# Patient Record
Sex: Male | Born: 1945 | Race: White | Hispanic: No | Marital: Married | Smoking: Former smoker
Health system: Southern US, Community
[De-identification: ages and names within clinical notes are randomized; demographics above are authoritative.]

## PROBLEM LIST (undated history)

## (undated) DIAGNOSIS — Z87442 Personal history of urinary calculi: Secondary | ICD-10-CM

## (undated) DIAGNOSIS — M1990 Unspecified osteoarthritis, unspecified site: Secondary | ICD-10-CM

## (undated) DIAGNOSIS — K760 Fatty (change of) liver, not elsewhere classified: Secondary | ICD-10-CM

## (undated) DIAGNOSIS — J189 Pneumonia, unspecified organism: Secondary | ICD-10-CM

## (undated) DIAGNOSIS — R51 Headache: Secondary | ICD-10-CM

## (undated) DIAGNOSIS — I499 Cardiac arrhythmia, unspecified: Secondary | ICD-10-CM

## (undated) DIAGNOSIS — I2510 Atherosclerotic heart disease of native coronary artery without angina pectoris: Secondary | ICD-10-CM

## (undated) DIAGNOSIS — I428 Other cardiomyopathies: Secondary | ICD-10-CM

## (undated) DIAGNOSIS — Z86010 Personal history of colonic polyps: Secondary | ICD-10-CM

## (undated) DIAGNOSIS — F329 Major depressive disorder, single episode, unspecified: Secondary | ICD-10-CM

## (undated) DIAGNOSIS — G4730 Sleep apnea, unspecified: Secondary | ICD-10-CM

## (undated) DIAGNOSIS — I4891 Unspecified atrial fibrillation: Secondary | ICD-10-CM

## (undated) DIAGNOSIS — I498 Other specified cardiac arrhythmias: Secondary | ICD-10-CM

## (undated) DIAGNOSIS — Z9289 Personal history of other medical treatment: Secondary | ICD-10-CM

## (undated) DIAGNOSIS — F5104 Psychophysiologic insomnia: Secondary | ICD-10-CM

## (undated) DIAGNOSIS — N400 Enlarged prostate without lower urinary tract symptoms: Secondary | ICD-10-CM

## (undated) DIAGNOSIS — K219 Gastro-esophageal reflux disease without esophagitis: Secondary | ICD-10-CM

## (undated) DIAGNOSIS — N529 Male erectile dysfunction, unspecified: Secondary | ICD-10-CM

## (undated) DIAGNOSIS — F419 Anxiety disorder, unspecified: Secondary | ICD-10-CM

## (undated) DIAGNOSIS — K5730 Diverticulosis of large intestine without perforation or abscess without bleeding: Secondary | ICD-10-CM

## (undated) DIAGNOSIS — E785 Hyperlipidemia, unspecified: Secondary | ICD-10-CM

## (undated) HISTORY — DX: Diverticulosis of large intestine without perforation or abscess without bleeding: K57.30

## (undated) HISTORY — PX: TONSILLECTOMY AND ADENOIDECTOMY: SUR1326

## (undated) HISTORY — DX: Unspecified osteoarthritis, unspecified site: M19.90

## (undated) HISTORY — DX: Anxiety disorder, unspecified: F41.9

## (undated) HISTORY — DX: Other cardiomyopathies: I42.8

## (undated) HISTORY — DX: Atherosclerotic heart disease of native coronary artery without angina pectoris: I25.10

## (undated) HISTORY — DX: Personal history of colonic polyps: Z86.010

## (undated) HISTORY — DX: Hyperlipidemia, unspecified: E78.5

## (undated) HISTORY — DX: Personal history of other medical treatment: Z92.89

## (undated) HISTORY — PX: APPENDECTOMY: SHX54

## (undated) HISTORY — DX: Gastro-esophageal reflux disease without esophagitis: K21.9

## (undated) HISTORY — DX: Major depressive disorder, single episode, unspecified: F32.9

## (undated) HISTORY — DX: Personal history of urinary calculi: Z87.442

## (undated) HISTORY — DX: Benign prostatic hyperplasia without lower urinary tract symptoms: N40.0

## (undated) HISTORY — DX: Fatty (change of) liver, not elsewhere classified: K76.0

## (undated) HISTORY — DX: Other specified cardiac arrhythmias: I49.8

## (undated) HISTORY — DX: Male erectile dysfunction, unspecified: N52.9

## (undated) HISTORY — DX: Psychophysiologic insomnia: F51.04

---

## 1984-09-18 HISTORY — PX: INGUINAL HERNIA REPAIR: SUR1180

## 2003-02-10 ENCOUNTER — Encounter: Payer: Self-pay | Admitting: Family Medicine

## 2003-02-10 ENCOUNTER — Encounter: Admission: RE | Admit: 2003-02-10 | Discharge: 2003-02-10 | Payer: Self-pay | Admitting: Family Medicine

## 2003-02-12 ENCOUNTER — Encounter: Payer: Self-pay | Admitting: General Surgery

## 2003-02-12 ENCOUNTER — Encounter (INDEPENDENT_AMBULATORY_CARE_PROVIDER_SITE_OTHER): Payer: Self-pay | Admitting: Specialist

## 2003-02-12 ENCOUNTER — Observation Stay (HOSPITAL_COMMUNITY): Admission: RE | Admit: 2003-02-12 | Discharge: 2003-02-12 | Payer: Self-pay | Admitting: General Surgery

## 2003-12-23 ENCOUNTER — Emergency Department (HOSPITAL_COMMUNITY): Admission: EM | Admit: 2003-12-23 | Discharge: 2003-12-23 | Payer: Self-pay | Admitting: Emergency Medicine

## 2004-09-18 HISTORY — PX: CHOLECYSTECTOMY: SHX55

## 2005-09-18 LAB — CONVERTED CEMR LAB: PSA: NORMAL ng/mL

## 2005-11-22 ENCOUNTER — Ambulatory Visit: Payer: Self-pay | Admitting: Internal Medicine

## 2005-12-01 ENCOUNTER — Ambulatory Visit: Payer: Self-pay | Admitting: Internal Medicine

## 2005-12-01 ENCOUNTER — Encounter (INDEPENDENT_AMBULATORY_CARE_PROVIDER_SITE_OTHER): Payer: Self-pay | Admitting: Specialist

## 2006-09-18 DIAGNOSIS — I498 Other specified cardiac arrhythmias: Secondary | ICD-10-CM

## 2006-09-18 HISTORY — DX: Other specified cardiac arrhythmias: I49.8

## 2007-02-20 ENCOUNTER — Ambulatory Visit: Payer: Self-pay | Admitting: Internal Medicine

## 2009-03-15 ENCOUNTER — Ambulatory Visit: Payer: Self-pay | Admitting: Internal Medicine

## 2009-03-15 DIAGNOSIS — Z860100 Personal history of colon polyps, unspecified: Secondary | ICD-10-CM | POA: Insufficient documentation

## 2009-03-15 DIAGNOSIS — F329 Major depressive disorder, single episode, unspecified: Secondary | ICD-10-CM

## 2009-03-15 DIAGNOSIS — K5730 Diverticulosis of large intestine without perforation or abscess without bleeding: Secondary | ICD-10-CM

## 2009-03-15 DIAGNOSIS — Z860101 Personal history of adenomatous and serrated colon polyps: Secondary | ICD-10-CM

## 2009-03-15 DIAGNOSIS — R109 Unspecified abdominal pain: Secondary | ICD-10-CM | POA: Insufficient documentation

## 2009-03-15 DIAGNOSIS — K219 Gastro-esophageal reflux disease without esophagitis: Secondary | ICD-10-CM

## 2009-03-15 DIAGNOSIS — F3289 Other specified depressive episodes: Secondary | ICD-10-CM

## 2009-03-15 HISTORY — DX: Diverticulosis of large intestine without perforation or abscess without bleeding: K57.30

## 2009-03-15 HISTORY — DX: Other specified depressive episodes: F32.89

## 2009-03-15 HISTORY — DX: Gastro-esophageal reflux disease without esophagitis: K21.9

## 2009-03-15 HISTORY — DX: Personal history of adenomatous and serrated colon polyps: Z86.0101

## 2009-03-15 HISTORY — DX: Personal history of colonic polyps: Z86.010

## 2009-03-15 HISTORY — DX: Major depressive disorder, single episode, unspecified: F32.9

## 2009-03-15 LAB — CONVERTED CEMR LAB
ALT: 23 units/L (ref 0–53)
Alkaline Phosphatase: 75 units/L (ref 39–117)
BUN: 16 mg/dL (ref 6–23)
Bilirubin Urine: NEGATIVE
Bilirubin, Direct: 0.2 mg/dL (ref 0.0–0.3)
Calcium: 9.8 mg/dL (ref 8.4–10.5)
Creatinine, Ser: 1.2 mg/dL (ref 0.4–1.5)
Eosinophils Relative: 2.3 % (ref 0.0–5.0)
GFR calc non Af Amer: 64.57 mL/min (ref >60)
Hemoglobin, Urine: NEGATIVE
LDL Cholesterol: 109 mg/dL — ABNORMAL HIGH (ref 0–99)
Lymphocytes Relative: 30.4 % (ref 12.0–46.0)
MCV: 88 fL (ref 78.0–100.0)
Monocytes Absolute: 0.7 10*3/uL (ref 0.1–1.0)
Monocytes Relative: 11 % (ref 3.0–12.0)
Neutrophils Relative %: 55.3 % (ref 43.0–77.0)
Platelets: 204 10*3/uL (ref 150.0–400.0)
RBC: 4.97 M/uL (ref 4.22–5.81)
TSH: 1.78 microintl units/mL (ref 0.35–5.50)
Total Bilirubin: 1.3 mg/dL — ABNORMAL HIGH (ref 0.3–1.2)
Total CHOL/HDL Ratio: 4
Total Protein, Urine: NEGATIVE mg/dL
Triglycerides: 92 mg/dL (ref 0.0–149.0)
Urine Glucose: NEGATIVE mg/dL
WBC: 6.6 10*3/uL (ref 4.5–10.5)

## 2009-03-19 ENCOUNTER — Encounter: Payer: Self-pay | Admitting: Internal Medicine

## 2009-03-19 ENCOUNTER — Ambulatory Visit: Payer: Self-pay | Admitting: Cardiovascular Disease

## 2009-03-19 DIAGNOSIS — N200 Calculus of kidney: Secondary | ICD-10-CM | POA: Insufficient documentation

## 2009-04-01 ENCOUNTER — Telehealth: Payer: Self-pay | Admitting: Internal Medicine

## 2010-01-28 ENCOUNTER — Ambulatory Visit: Payer: Self-pay | Admitting: Internal Medicine

## 2010-01-28 DIAGNOSIS — N529 Male erectile dysfunction, unspecified: Secondary | ICD-10-CM

## 2010-01-28 DIAGNOSIS — R1011 Right upper quadrant pain: Secondary | ICD-10-CM

## 2010-01-28 DIAGNOSIS — R739 Hyperglycemia, unspecified: Secondary | ICD-10-CM

## 2010-01-28 DIAGNOSIS — Z87442 Personal history of urinary calculi: Secondary | ICD-10-CM

## 2010-01-28 HISTORY — DX: Personal history of urinary calculi: Z87.442

## 2010-01-28 HISTORY — DX: Male erectile dysfunction, unspecified: N52.9

## 2010-01-28 LAB — CONVERTED CEMR LAB
AST: 22 units/L (ref 0–37)
Alkaline Phosphatase: 81 units/L (ref 39–117)
BUN: 17 mg/dL (ref 6–23)
Basophils Absolute: 0.1 10*3/uL (ref 0.0–0.1)
Bilirubin Urine: NEGATIVE
Calcium: 9.7 mg/dL (ref 8.4–10.5)
Cholesterol: 157 mg/dL (ref 0–200)
Creatinine, Ser: 1 mg/dL (ref 0.4–1.5)
Creatinine,U: 178 mg/dL
GFR calc non Af Amer: 75.96 mL/min (ref >60)
Glucose, Bld: 102 mg/dL — ABNORMAL HIGH (ref 70–99)
Ketones, ur: NEGATIVE mg/dL
LDL Cholesterol: 106 mg/dL — ABNORMAL HIGH (ref 0–99)
Leukocytes, UA: NEGATIVE
Lymphocytes Relative: 32.8 % (ref 12.0–46.0)
Lymphs Abs: 2.3 10*3/uL (ref 0.7–4.0)
Microalb, Ur: 0.5 mg/dL (ref 0.0–1.9)
Monocytes Relative: 9.7 % (ref 3.0–12.0)
Neutrophils Relative %: 53.9 % (ref 43.0–77.0)
PSA: 2.76 ng/mL (ref 0.10–4.00)
Platelets: 199 10*3/uL (ref 150.0–400.0)
RDW: 13.8 % (ref 11.5–14.6)
Total Bilirubin: 0.9 mg/dL (ref 0.3–1.2)
Total Protein, Urine: NEGATIVE mg/dL
VLDL: 16 mg/dL (ref 0.0–40.0)
pH: 5.5 (ref 5.0–8.0)

## 2010-01-31 LAB — CONVERTED CEMR LAB
Sex Hormone Binding: 19 nmol/L (ref 13–71)
Testosterone: 204.26 ng/dL — ABNORMAL LOW (ref 350–890)

## 2010-02-07 ENCOUNTER — Encounter: Admission: RE | Admit: 2010-02-07 | Discharge: 2010-02-07 | Payer: Self-pay | Admitting: Internal Medicine

## 2010-04-05 ENCOUNTER — Ambulatory Visit: Payer: Self-pay | Admitting: Internal Medicine

## 2010-04-05 DIAGNOSIS — M79609 Pain in unspecified limb: Secondary | ICD-10-CM | POA: Insufficient documentation

## 2010-10-18 NOTE — Assessment & Plan Note (Signed)
Summary: STOMACH PROBLEMS/NWS   Vital Signs:  Patient profile:   65 year old male Weight:      288 pounds BMI:     34.28 Temp:     98.0 degrees F oral BP sitting:   122 / 78  (left arm) Cuff size:   large  Vitals Entered By: Duard Brady LPN (Jan 28, 2010 9:16 AM)  CC: c/o RUQ pain - constent , using Prilosec otc Is Patient Diabetic? No   CC:  c/o RUQ pain - constent  and using Prilosec otc.  History of Present Illness: here for wellness  also s/p ccx;  c/o pain to the RUQ for approx 4 wks, dull , mild to mod; no radiation, no worse with food, and no n/v, no bowel change, no blood or radiation; has had mod to severe reflux symtpoms - worse with chinese food recently - better with water and OTC antacid;  no wt loss, night sweats, fever.   Has had more stress recently;  more travel recently.  Pt denies other CP, sob, doe, wheezing, orthopnea, pnd, worsening LE edema, palps, dizziness or syncope . last episode in teens  - resolved in mid20's.  No prior egd's.  No recent wt loss, fever, night sweats or other constitutional symtpoms.  Pt denies polydipsia, polyuria, or low sugar symptoms such as shakiness improved with eating.  Overall good compliance with meds, trying to follow low chol, DM diet, wt stable, little excercise however   Preventive Screening-Counseling & Management  Alcohol-Tobacco     Smoking Status: never      Drug Use:  no.    Problems Prior to Update: 1)  Diabetes Mellitus, Type II  (ICD-250.00) 2)  Gerd  (ICD-530.81) 3)  Ruq Pain  (ICD-789.01) 4)  Erectile Dysfunction, Organic  (ICD-607.84) 5)  Nephrolithiasis, Hx of  (ICD-V13.01) 6)  Preventive Health Care  (ICD-V70.0) 7)  Nephrolithiasis  (ICD-592.0) 8)  Abdominal Pain Other Specified Site  (ICD-789.09) 9)  Preventive Health Care  (ICD-V70.0) 10)  Colonic Polyps, Hx of  (ICD-V12.72) 11)  Diverticulosis, Colon  (ICD-562.10) 12)  Depression  (ICD-311) 13)  Gerd  (ICD-530.81)  Medications Prior to  Update: 1)  Aspir-Low 81 Mg Tbec (Aspirin) .Marland Kitchen.. 1po Once Daily  Current Medications (verified): 1)  Aspir-Low 81 Mg Tbec (Aspirin) .Marland Kitchen.. 1po Once Daily 2)  Prilosec Otc 20 Mg Tbec (Omeprazole Magnesium) .Marland Kitchen.. 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         all otherwise negative per pt -  except for mild worsening ED symtpoms  recent  Physical Exam  General:  alert and overweight-appearing.   Head:  normocephalic and atraumatic.   Eyes:  vision grossly intact, pupils equal, and pupils round.   Ears:  R ear normal and L ear normal.   Nose:  no external deformity and no nasal discharge.   Mouth:  no gingival abnormalities and pharynx pink and moist.   Neck:  supple and no masses.   Lungs:  normal respiratory effort and normal breath sounds.   Heart:  normal rate and regular rhythm.   Abdomen:  soft, normal bowel sounds, no distention, no masses, and no guarding.  with mild epigastric tender Msk:  no joint tenderness and no joint swelling.   Extremities:  no edema, no erythema  Neurologic:  cranial nerves II-XII intact and strength normal in all extremities.     Impression & Recommendations:  Problem # 1:  Preventive Health Care (ICD-V70.0) Overall doing well, age appropriate education and counseling updated and referral for appropriate preventive services done unless declined, immunizations up to date or declined, diet counseling done if overweight, urged to quit smoking if smokes , most recent labs reviewed and current ordered if appropriate, ecg reviewed or declined (interpretation per ECG scanned in the EMR if done); information regarding Medicare Prevention requirements given if appropriate; speciality referrals updated as appropriate  Orders: TLB-BMP (Basic Metabolic Panel-BMET) (80048-METABOL) TLB-CBC Platelet - w/Differential (85025-CBCD) TLB-Hepatic/Liver Function Pnl (80076-HEPATIC) TLB-Lipid Panel (80061-LIPID) TLB-TSH (Thyroid Stimulating Hormone) (84443-TSH) TLB-PSA (Prostate Specific Antigen) (84153-PSA) TLB-Udip ONLY (81003-UDIP)  Problem # 2:  ERECTILE DYSFUNCTION, ORGANIC (ICD-607.84)  mild, requests testosterone check only  Orders: T-Testosterone, Free and Total [phone])  Problem # 3:  RUQ PAIN (ICD-789.01)  for f/u  with tests above, cant r/o pud;  for tx as below, also  check h pylori, consider GI eval  Orders: Radiology Referral (Radiology) TLB-H. Pylori Abs(Helicobacter Pylori) (86677-HELICO)  Problem # 4:  GERD (ICD-530.81)  His updated medication list for this problem includes:    Prilosec Otc 20 Mg Tbec (Omeprazole magnesium) .Marland Kitchen... 1 by mouth two times a day to increased to two times a day for 4 wks;  to GI if not well controlled  Problem # 5:  DIABETES MELLITUS, TYPE II (ICD-250.00)  His updated medication list for this problem includes:    Aspir-low 81 Mg Tbec (Aspirin) .Marland Kitchen... 1po once daily  Orders: TLB-A1C / Hgb A1C (Glycohemoglobin) (83036-A1C) TLB-Microalbumin/Creat Ratio, Urine (82043-MALB)  Labs Reviewed: Creat: 1.2 (03/15/2009)    Reviewed HgBA1c results: 6.6 (03/15/2009) stable overall by hx and exam, ok to continue meds/tx as is   Complete Medication List: 1)  Aspir-low 81 Mg Tbec (Aspirin) .Marland Kitchen.. 1po once daily 2)  Prilosec Otc 20 Mg Tbec (Omeprazole magnesium) .Marland Kitchen.. 1 by mouth two times a day  Patient Instructions: 1)  Please go to the Lab in the basement for your blood and/or urine tests today 2)  increase the omeprazole 20 mg to twice per day 3)  You will be contacted about the referral(s) to: ultrasound 4)  please call if the medication does not seem to work  in a week, for GI referral, or if symptoms recur after stopping the omeprazole after 4 wks 5)  Please schedule a follow-up appointment in 1 year or sooner if needed Prescriptions: PRILOSEC OTC 20 MG TBEC (OMEPRAZOLE MAGNESIUM) 1 by mouth two times a day  #60 x 11   Entered and Authorized by:   Corwin Levins MD   Signed by:   Corwin Levins MD on 01/28/2010   Method used:   Print then Give to Patient   RxID:   [id number]

## 2010-10-18 NOTE — Assessment & Plan Note (Signed)
Summary: L PAINFUL SWOLLEN FOOT--STC   Vital Signs:  Patient profile:   65 year old male Height:      75 inches Weight:      295.50 pounds BMI:     37.07 O2 Sat:      96 % on Room air Temp:     98.7 degrees F oral Pulse rate:   42 / minute BP sitting:   120 / 72  (left arm) Cuff size:   large  Vitals Entered By: Zella Ball Ewing CMA Duncan Dull) (April 05, 2010 11:43 AM)  O2 Flow:  Room air  CC: Left  foot painful and swollen/RE   CC:  Left  foot painful and swollen/RE.  History of Present Illness: here with 3 days onset left lateral mid foot area mod to severe pain, redness and swelling , without fever, chills, red streaks, or obvious trauma or foot injury;  seemed to start 3 days ago after cutting the grass with a push mower ; toes have little pain though and flexion/extnesion does not seem to exacerbate;  walking makes worse and foot otherwise without blueness , other pain, numb, weakness.  Pt denies CP, sob, doe, wheezing, orthopnea, pnd, worsening LE edema, palps, dizziness or syncope  Pt denies new neuro symptoms such as headache, facial or extremity weakness   Has hx of gout to right first MTP.  Pt denies polydipsia, polyuria, or low sugar symptoms such as shakiness improved with eating.  Overall good compliance with meds, trying to follow low chol, DM diet, wt stable, little excercise however   Problems Prior to Update: 1)  Foot Pain, Left  (ICD-729.5) 2)  Diabetes Mellitus, Type II  (ICD-250.00) 3)  Gerd  (ICD-530.81) 4)  Ruq Pain  (ICD-789.01) 5)  Erectile Dysfunction, Organic  (ICD-607.84) 6)  Nephrolithiasis, Hx of  (ICD-V13.01) 7)  Preventive Health Care  (ICD-V70.0) 8)  Nephrolithiasis  (ICD-592.0) 9)  Abdominal Pain Other Specified Site  (ICD-789.09) 10)  Preventive Health Care  (ICD-V70.0) 11)  Colonic Polyps, Hx of  (ICD-V12.72) 12)  Diverticulosis, Colon  (ICD-562.10) 13)  Depression  (ICD-311) 14)  Gerd  (ICD-530.81)  Medications Prior to Update: 1)  Aspir-Low 81 Mg  Tbec (Aspirin) .Marland Kitchen.. 1po Once Daily 2)  Prilosec Otc 20 Mg Tbec (Omeprazole Magnesium) .Marland Kitchen.. 1 By Mouth Two Times A Day  Current Medications (verified): 1)  Aspir-Low 81 Mg Tbec (Aspirin) .Marland Kitchen.. 1po Once Daily 2)  Prilosec Otc 20 Mg Tbec (Omeprazole Magnesium) .Marland Kitchen.. 1 By Mouth Two Times A Day 3)  Doxycycline Hyclate 100 Mg Caps (Doxycycline Hyclate) .Marland Kitchen.. 1 By Mouth Two Times A Day 4)  Prednisone 10 Mg Tabs (Prednisone) .... 4po Qd For 3days, Then 3po Qd For 3days, Then 2po Qd For 3days, Then 1po Qd For 3 Days, Then Stop 5)  Oxycodone Hcl 5 Mg Tabs (Oxycodone Hcl) .Marland Kitchen.. 1po Q 6 Hrs As Needed Pain  Allergies (verified): No Known Drug Allergies  Past History:  Past Medical History: Last updated: 01/28/2010 GERD Depression/anxiety - situaional chronic insomnia Erectile dysfunction atrial bigeminy - dr Graciela Husbands - 2008 Diverticulosis, colon Colonic polyps, hx of - adenomatous 3/07 fatty liver Nephrolithiasis, hx of - left renal stone Diabetes mellitus, type II - diet  Past Surgical History: Last updated: 01/28/2010 Inguinal herniorrhaphy Appendectomy Cholecystectomy - 2006 Tonsillectomy  Social History: Last updated: 01/28/2010 Married step daughter and 2 8yo twins live with 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   Head:  normocephalic and atraumatic.   Eyes:  vision grossly intact, pupils equal, and pupils round.   Ears:  R ear normal and L ear normal.   Nose:  no external deformity and no nasal discharge.   Mouth:  no gingival abnormalities and pharynx pink and moist.   Neck:  supple and no masses.   Lungs:  normal respiratory effort and normal breath sounds.   Heart:  normal rate and regular rhythm.   Msk:  left lateral mid foot with  nondiscrete but rather large approx 4 cm area marked red, swelling, tender without fluctuance or abscess;  mid dorsal and medial foot non tender, toes nontender and non swollen, but sweling from the midfoot does extend to above the left ankle;  ankle essentially Nontender with FROM;  left foot o/w neurovasc intact  Extremities:  no edema, no erythema except for above    Impression & Recommendations:  Problem # 1:  FOOT PAIN, LEFT (ICD-729.5) diff includes gout, vs trauma, stress fx or cellultiits -  more likely cellulitis vs gout - ok for depo IM today, pt declines films, and tx with doxy course and prednisone for home;  consider ortho for any worsening symptoms Orders: Depo- Medrol 80mg  (J1040) Depo- Medrol 40mg  (J1030) Admin of Therapeutic Inj  intramuscular or subcutaneous (16109)  Problem # 2:  DIABETES MELLITUS, TYPE II (ICD-250.00)  His updated medication list for this problem includes:    Aspir-low 81 Mg Tbec (Aspirin) .Marland Kitchen... 1po once daily  Labs Reviewed: Creat: 1.0 (01/28/2010)    Reviewed HgBA1c results: 6.4 (01/28/2010)  6.6 (03/15/2009) d/w pt - currently asympt and controlled - to call for onset polydipsia , polyuria or increased cbg > 200  Complete Medication List: 1)  Aspir-low 81 Mg Tbec (Aspirin) .Marland Kitchen.. 1po once daily 2)  Prilosec Otc 20 Mg Tbec (Omeprazole magnesium) .Marland Kitchen.. 1 by mouth two times a day 3)  Doxycycline Hyclate 100 Mg Caps (Doxycycline hyclate) .Marland Kitchen.. 1 by mouth two times a day 4)  Prednisone 10 Mg Tabs (Prednisone) .... 4po qd for 3days, then 3po qd for 3days, then 2po qd for 3days, then 1po qd for 3 days, then stop 5)  Oxycodone Hcl 5 Mg Tabs (Oxycodone hcl) .Marland Kitchen.. 1po q 6 hrs as needed pain  Patient Instructions: 1)  you had the steroid shot today 2)  Please take all new medications as prescribed 3)  Continue all previous medications as before this visit  4)  Please schedule a follow-up appointment in May 2012 with CPX labs Prescriptions: OXYCODONE HCL 5  MG TABS (OXYCODONE HCL) 1po q 6 hrs as needed pain  #60 x 0   Entered and Authorized by:   Corwin Levins MD   Signed by:   Corwin Levins MD on 04/05/2010   Method used:   Print then Give to Patient   RxID:   [id number] PREDNISONE 10 MG TABS (PREDNISONE) 4po qd for 3days, then 3po qd for 3days, then 2po qd for 3days, then 1po qd for 3 days, then stop  #30 x 0   Entered and Authorized by:   Corwin Levins MD   Signed by:   Corwin Levins MD on 04/05/2010   Method used:   Print then Give to Patient   RxID:  [id number] DOXYCYCLINE HYCLATE 100 MG CAPS (DOXYCYCLINE HYCLATE) 1 by mouth two times a day  #20 x 0   Entered and Authorized by:   Corwin Levins MD   Signed by:   Corwin Levins MD on 04/05/2010   Method used:   Print then Give to Patient   RxID:   [id number]    Medication Administration  Injection # 1:    Medication: Depo- Medrol 80mg     Diagnosis: FOOT PAIN, LEFT (ICD-729.5)    Route: IM    Site: R deltoid    Exp Date: 12/2012    Lot #: [id number]    Mfr: [employer]    Comments: 120mg     Patient tolerated injection without complications    Given by: Lamar Sprinkles, CMA (April 05, 2010 12:43 PM)  Injection # 2:    Medication: Depo- Medrol 40mg     Diagnosis: FOOT PAIN, LEFT (ICD-729.5)    Comments: Same as above    Patient tolerated injection without complications    Given by: Lamar Sprinkles, CMA (April 05, 2010 12:44 PM)  Orders Added: 1)  Depo- Medrol 80mg  [J1040] 2)  Depo- Medrol 40mg  [J1030] 3)  Admin of Therapeutic Inj  intramuscular or subcutaneous [96372] 4)  Est. Patient Level IV [32440]

## 2010-12-07 ENCOUNTER — Encounter: Payer: Self-pay | Admitting: Internal Medicine

## 2010-12-13 ENCOUNTER — Other Ambulatory Visit (INDEPENDENT_AMBULATORY_CARE_PROVIDER_SITE_OTHER): Payer: Medicare Other

## 2010-12-13 ENCOUNTER — Encounter: Payer: Self-pay | Admitting: Internal Medicine

## 2010-12-13 ENCOUNTER — Ambulatory Visit (INDEPENDENT_AMBULATORY_CARE_PROVIDER_SITE_OTHER): Payer: Medicare Other | Admitting: Internal Medicine

## 2010-12-13 VITALS — BP 132/70 | HR 48 | Temp 98.0°F | Ht 77.0 in | Wt 289.2 lb

## 2010-12-13 DIAGNOSIS — R109 Unspecified abdominal pain: Secondary | ICD-10-CM

## 2010-12-13 DIAGNOSIS — Z0000 Encounter for general adult medical examination without abnormal findings: Secondary | ICD-10-CM

## 2010-12-13 DIAGNOSIS — R1011 Right upper quadrant pain: Secondary | ICD-10-CM

## 2010-12-13 DIAGNOSIS — Z125 Encounter for screening for malignant neoplasm of prostate: Secondary | ICD-10-CM

## 2010-12-13 DIAGNOSIS — R1310 Dysphagia, unspecified: Secondary | ICD-10-CM

## 2010-12-13 DIAGNOSIS — G4710 Hypersomnia, unspecified: Secondary | ICD-10-CM

## 2010-12-13 DIAGNOSIS — R10A1 Flank pain, right side: Secondary | ICD-10-CM

## 2010-12-13 DIAGNOSIS — E119 Type 2 diabetes mellitus without complications: Secondary | ICD-10-CM

## 2010-12-13 LAB — LIPID PANEL
Cholesterol: 181 mg/dL (ref 0–200)
HDL: 37 mg/dL — ABNORMAL LOW (ref >39.00)
Triglycerides: 111 mg/dL (ref 0.0–149.0)
VLDL: 22.2 mg/dL (ref 0.0–40.0)

## 2010-12-13 LAB — CBC WITH DIFFERENTIAL/PLATELET
Eosinophils Relative: 2.1 % (ref 0.0–5.0)
HCT: 46.6 % (ref 39.0–52.0)
Hemoglobin: 15.8 g/dL (ref 13.0–17.0)
Lymphs Abs: 2.6 10*3/uL (ref 0.7–4.0)
Monocytes Relative: 9.3 % (ref 3.0–12.0)
Neutro Abs: 4.3 10*3/uL (ref 1.4–7.7)
Platelets: 211 10*3/uL (ref 150.0–400.0)
WBC: 7.9 10*3/uL (ref 4.5–10.5)

## 2010-12-13 LAB — URINALYSIS, ROUTINE W REFLEX MICROSCOPIC
Bilirubin Urine: NEGATIVE
Hgb urine dipstick: NEGATIVE
Ketones, ur: NEGATIVE
Total Protein, Urine: NEGATIVE
Urine Glucose: NEGATIVE
pH: 5 (ref 5.0–8.0)

## 2010-12-13 LAB — PSA: PSA: 2.8 ng/mL (ref 0.10–4.00)

## 2010-12-13 LAB — HEPATIC FUNCTION PANEL
ALT: 24 U/L (ref 0–53)
AST: 21 U/L (ref 0–37)
Total Bilirubin: 1 mg/dL (ref 0.3–1.2)

## 2010-12-13 LAB — BASIC METABOLIC PANEL
CO2: 31 mEq/L (ref 19–32)
Chloride: 107 mEq/L (ref 96–112)
Creatinine, Ser: 1.1 mg/dL (ref 0.4–1.5)
Potassium: 4.6 mEq/L (ref 3.5–5.1)
Sodium: 142 mEq/L (ref 135–145)

## 2010-12-13 LAB — TSH: TSH: 2.29 u[IU]/mL (ref 0.35–5.50)

## 2010-12-13 MED ORDER — PANTOPRAZOLE SODIUM 40 MG PO TBEC: 40.0000 mg | DELAYED_RELEASE_TABLET | Freq: Every day | ORAL | Status: DC

## 2010-12-13 NOTE — Assessment & Plan Note (Signed)
Mild, seems MSK by hx, will check UA , but o/w consider imaging/CT if persists or worsens;  Does seem curious it is about the same level as the RUQ pain

## 2010-12-13 NOTE — Assessment & Plan Note (Signed)
Suspect gastritis, PUD or other, possibly related to nsaid use;  To hold the daily alleve (as well as the ASA for now as he is not taking anyway),  Start protonix daily, Refer GI  - likely needs EGD to further evaluate; check labs today;  Also is due for screening colonscopy  (? Can be done at same time as EGD?)

## 2010-12-13 NOTE — Assessment & Plan Note (Signed)
I suspect would benefit from sleep evaluation to r/o OSA but he declines at this time

## 2010-12-13 NOTE — Progress Notes (Signed)
  Subjective:    Patient ID: Dennis Hill, male    DOB: 1946-08-03, 65 y.o.   MRN: [id number]  HPI    Also had 2 wks onset pain to the right abd (seems to point to area mid between upper and lower);  Mild to mod, intermittent,  Radiates to the back, better to sit with back against the wall, worse with cold food (ice cream last night) and worse to lie on the right side;  Did have a signficant attack of reflux  About 3 wks ago, but none since , not had to use tums.  No n/v, other abd pain, constipation, diarrhea, fever, blood, or recent wt loss despite some lower appetitie.  Pt denies chest pain, increased sob or doe, wheezing, orthopnea, PND, increased LE swelling, palpitations, dizziness or syncope.    Pt denies new neurological symptoms such as new headache, or facial or extremity weakness or numbness.    Pt denies polydipsia, polyuria.   Is CCX, Appy , tonsils, RIH;  Mother with hx of mult hernias requiring surgury.  Does have dysphagia to pills, also to dry foods.  Has had may 2011 abd u/s - neg, and 2010 CT Abd - neg for acute.  Does drink occasional beer - does not make pain worse.  Takes alleve almost every day - usually only once per day, for right flank area pain worse with movement at the waist such as bending or turning left or right.  No Lower back pain, no LE pain/weak/numb, falls, gait change, fever, wt change.  Overall good compliance with treatment, and good medicine tolerability.  Pt denies fever, wt loss, night sweats, loss of appetite, or other constitutional symptoms  Denies worsening depressive symptoms, suicidal ideation, or panic.  Review of Systems Review of Systems  Constitutional: Negative for diaphoresis and unexpected weight change.  HENT: Negative for drooling and tinnitus.   Eyes: Negative for photophobia and visual disturbance.  Respiratory: Negative for choking and stridor.   Gastrointestinal: Negative for vomiting and blood in stool.  Genitourinary: Negative for  hematuria and decreased urine volume.  Musculoskeletal: Negative for gait problem.  Skin: Negative for color change and wound.  Neurological: Negative for tremors and numbness.  Psychiatric/Behavioral: Negative for decreased concentration. The patient is not hyperactive.   Does have daily somnolence, with naps near daily, sometimes soon after getting up in the AM, has some snoring at night. No Am HA's     Objective:   Physical Exam Physical Exam  VS noted Constitutional: Pt appears well-developed and well-nourished.  HENT: Head: Normocephalic.  Right Ear: External ear normal.  Left Ear: External ear normal.  Eyes: Conjunctivae and EOM are normal. Pupils are equal, round, and reactive to light.  Neck: Normal range of motion. Neck supple.  Cardiovascular: Normal rate and regular rhythm.   Pulmonary/Chest: Effort normal and breath sounds normal.  Abd:  Soft,  non-distended, + BS, mild tender RUQ, without guarding or rebound Neurological: Pt is alert. No cranial nerve deficit. Motor gross normal MSK:  Mild right flank tender without erythema, swelling, rash Skin: Skin is warm. No erythema.  Psychiatric: Pt behavior is normal. Thought content normal.         Assessment & Plan:

## 2010-12-13 NOTE — Assessment & Plan Note (Signed)
Mild, as above may warrant need for EGD

## 2010-12-13 NOTE — Patient Instructions (Signed)
Stop the alleve Hold the aspirin for now Start the generic protonix 40 mg per day You will be contacted regarding the referral for: GI OK to take the tylenol (acetaminophen) as discussed, for the back pain Please go to LAB in the Basement for the blood and/or urine tests to be done today Please call the number on the [facility] Card for results in 2-3 days Please call if the right flank/back pain worsens;  It seems muscular today, but could warrant CT to make sure no kidney stone if changes or gets worse Please call if you would like the referral to Pulmonary for the daily sleepiness problem

## 2010-12-15 NOTE — Letter (Signed)
Summary: Colonoscopy Letter   Gastroenterology  [address] [place], [place] [address]   Phone: [phone]  Fax: [phone]      December 07, 2010 MRN: [id number]   Dennis Hill [address] [place], [place]  [address]   Dear [name],   According to your medical record, it is time for you to schedule a Colonoscopy. The American Cancer Society recommends this procedure as a method to detect early colon cancer. Patients with a family history of colon cancer, or a personal history of colon polyps or inflammatory bowel disease are at increased risk.  This letter has been generated based on the recommendations made at the time of your procedure. If you feel that in your particular situation this may no longer apply, please contact our office.  Please call our office at [phone] to schedule this appointment or to update your records at your earliest convenience.  Thank you for cooperating with [place] to provide you with the very best care possible.   Sincerely,  Hedwig Morton. Juanda Chance, M.D.  [facility] Gastroenterology Division [phone]

## 2011-01-26 ENCOUNTER — Encounter: Payer: Medicare Other | Admitting: Internal Medicine

## 2011-01-31 ENCOUNTER — Encounter: Payer: Self-pay | Admitting: Internal Medicine

## 2011-01-31 NOTE — Letter (Signed)
February 20, 2007    Quita Skye. Artis Flock, M.D.  [address]  [place], [place] [address]   RE:  ZORION, NIMS  MRN:  [id number]  /  DOB:  April 13, 1946   Dear Rosanne Ashing,   It was a pleasure to see Charmayne Sheer at your request today  regarding his irregular rhythm.   Mr. Laufer is a 65 year old gentleman who was undergoing  colonoscopy where an irregular pulse was noted. You saw him and took the  electrocardiogram. The patient has no palpitations.   There is some question about progressive exercise intolerance though it  has been really hard for me to clarify that as I talk to him. He is able  to do pretty much of anything he wants, although he found that when he  moved recently he has not been able to lift as much.   He has significant fatigue. He sleeps badly, he only sleeps about 3 or 4  hours a day. He wakes up at night and can not go back to sleep because  his mind is racing. Sometimes he is able to sleep for an hour before  work later in the day. His wife says he snores and he has also noted  that he has had progressive daytime fatigue so that somewhere in the  middle of the afternoon he hits the wall and then has to try to  regroup for the rest of the day.   I asked him whether he was depressed and he said sometimes, so there was  some recognition there.   He has no orthopnea, nocturnal dyspnea. He does have occasional  peripheral edema. He has not had problems with hypertension and he does  not use salt significantly.   PAST MEDICAL HISTORY:  In addition to the above is notable for the  recent onset of erectile dysfunction with which he has conferred with  you.   PAST SURGICAL HISTORY:  Notable for appendectomy, herniorrhaphy, and  gallbladder surgery.   SOCIAL HISTORY:  He is married. He lives with his step-daughter and her  two 86-year-old twins as well as his wife. He does not use cigarettes or  recreational drugs. He drinks a couple alcoholic  beverages a week.   He takes no medications. He has no known drug allergies.   REVIEW OF SYSTEMS:  Probably negative apart from what is noted  previously.   On examination, he is an older Caucasian male appearing his stated age  of 65. His blood pressure is 132/73.  Neck veins were flat. Carotids were brisk and full bilaterally without  bruits.  The back was without kyphosis, scoliosis.  LUNGS: Clear.  HEART SOUNDS: Were actually regular today without murmurs or gallops.  ABDOMEN: Soft with active bowel sounds, without midline pulsation or  hepatomegaly.  Femoral pulses were 2 +, distal pulses were intact. There was no  clubbing, cyanosis, or edema.  NEUROLOGICAL EXAM: Grossly normal.  SKIN: Warm and dry.   Electrocardiogram dated today demonstrated sinus rhythm with frequent  atrial ectopy often in a pattern of bigeminy with beats that appear to  be originating from the right side.   IMPRESSION:  1. Atrial bigeminy.  2. Significant daytime somnolence and poor sleep. Question obstructive      sleep apnea.  3. Question depression.   Mr. Dreshon, Proffit has atrial ectopy the cause of which is not clear.  The implications of which are also not clear. I thought we would take an  Ultrasound  to make sure that there is no evidence of structural heart  disease and if that looks ok, I would not peruse further assuming that  he has already had his thyroids checked and all that kind of stuff.   As it relates to potential implications, I am not sure that it is  related to his fatigue. He has no palpitations. It may be a harbinger of  atrial fibrillation; it is one of my biases that atrial fibrillation is  more common in people who are bigger as their atrial surface area is  larger, and so this is something that we would want to keep an eye on.   As it relates to his fatigue, I thought a sleep study was in order and I  will plan to talk with you about that later today.   Again  thanks very much for asking [place] to see him. We will get back with  him about the echocardiogram. Other than that if that looks normal, we  will be glad to see him at your request.    Sincerely,      Duke Salvia, MD, [facility]  Electronically Signed    SCK/MedQ  DD: 02/20/2007  DT: 02/20/2007  Job #: [id number]

## 2011-02-03 ENCOUNTER — Ambulatory Visit (INDEPENDENT_AMBULATORY_CARE_PROVIDER_SITE_OTHER): Payer: Medicare Other | Admitting: Internal Medicine

## 2011-02-03 ENCOUNTER — Encounter: Payer: Self-pay | Admitting: Internal Medicine

## 2011-02-03 VITALS — BP 132/64 | HR 60 | Ht 77.0 in | Wt 289.0 lb

## 2011-02-03 DIAGNOSIS — R933 Abnormal findings on diagnostic imaging of other parts of digestive tract: Secondary | ICD-10-CM

## 2011-02-03 DIAGNOSIS — R1011 Right upper quadrant pain: Secondary | ICD-10-CM

## 2011-02-03 MED ORDER — PEG-KCL-NACL-NASULF-NA ASC-C 100 G PO SOLR: 1.0000 | Freq: Once | ORAL | Status: AC

## 2011-02-03 MED ORDER — ESOMEPRAZOLE MAGNESIUM 40 MG PO CPDR: 40.0000 mg | DELAYED_RELEASE_CAPSULE | Freq: Every day | ORAL | Status: DC

## 2011-02-03 NOTE — Progress Notes (Signed)
Dennis Hill 09-15-1946 MRN [id number]     History of Present Illness:  This is a 65 year old white male with chronic intermittent discomfort along the right costal margin and in the epigastrium. It is relieved by laying down. It is worse when sitting up or driving a car. It is not related to meals. It is not related to bowel movements. It at times  radiates to the back. It is not worse with deep inspiration. Change of position may make it go away, for instance when he turns to the other side at night. He has had a raspy voice but no nocturnal cough. Food sometimes hesitates before passing into the stomach. There is a history of a laparoscopic cholecystectomy. He had a colonoscopy in 2007 with findings of adenomatous polyps. He is due for a recall colonoscopy. A CT scan of the abdomen in July 2010 showed no acute findings. An upper abdominal ultrasound in May 2011 showed fatty liver, 6 mm common bile duct, 5.8 cm spleen and left renal stone. He has not tried any acid reducers although they are listed on his medication list.   Past Medical History  Diagnosis Date  . DIABETES MELLITUS, TYPE II 01/28/2010  . DEPRESSION 03/15/2009  . GERD 03/15/2009  . DIVERTICULOSIS, COLON 03/15/2009  . 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 FOOT PAIN, LEFT 04/05/2010  . RUQ PAIN 01/28/2010  . Hx of adenomatous colonic polyps 03/15/2009  . NEPHROLITHIASIS, HX OF 01/28/2010  . Atrial bigeminy 2008    Dr. Graciela Husbands  . Anxiety   . Chronic insomnia   . Fatty liver   . Internal hemorrhoids    Past Surgical History  Procedure Date  . Appendectomy   . Cholecystectomy 2006  . Tonsillectomy   . Inguinal herniorrhapy   . Hernia repair 1986    right inguinal    reports that he has quit smoking. He has never used smokeless tobacco. He reports that he drinks alcohol. He reports that he does not use illicit drugs. family history includes Colon cancer (age of onset:70) in his  mother. No Known Allergies      Review of Systems: Positive for occasional dysphagia, denies chest pain negative for blood in stools  The remainder of the 10  point ROS is negative except as outlined in H&P   Physical Exam: General appearance  Well developed, in no distress. Eyes- non icteric. HEENT nontraumatic, normocephalic. Mouth no lesions, tongue papillated, no cheilosis. Neck supple without adenopathy, thyroid not enlarged, no carotid bruits, no JVD. Lungs Clear to auscultation bilaterally. Cor normal S1 normal S2, regular rhythm , no murmur,  quiet precordium. Abdomen mild tenderness in epigastrium and to the right of the epigastrium. No bruit or pulsations. Liver edge at costal margin. Overall span 8-9 cm. No ascites. No CVA tenderness. Laying down and sitting up does not seem to precipitate the pain. Rectal not done. Extremities no pedal edema. Skin no lesions. Neurological alert and oriented x 3. Psychological normal mood and affect. Assessment and Plan:  Problems #1 right costal margin pain. This may be related to gastroesophageal reflux. He has a lot of symptoms suggestive of reflux such as hoarseness, difficulty in swallowing, and worsening with certain positions. It also could represent a fatty liver and mild hepatomegaly although the last CT scan of the abdomen in 2010 did not mention fatty liver. His liver function tests have been normal. We will proceed with an upper endoscopy. He will start samples of Nexium 40  mg twice a day.   Problem #2 history of adenomatous polyp. He is due for a recall colonoscopy. This will be scheduled at the same time as the upper endoscopy.       02/03/2011 Dennis Hill

## 2011-02-03 NOTE — Patient Instructions (Addendum)
You have been scheduled for an endoscopy and colonoscopy. Please follow the written instructions given to you at your visit today. Please take Nexium 40 mg by mouth twice daily. We have given you samples of this. Your physician has requested that you go to the basement for the following lab work on Monday 02/06/11 while fasting (nothing to eat or drink 6 hours prior to test): Glucose, Hemoglobin A1C CC: Dr Jonny Ruiz

## 2011-02-03 NOTE — Letter (Signed)
May 27, 2007    Quita Skye. Artis Flock, M.D.  [address]  [place], [place] [address]   RE:  ZYIER, DYKEMA  MRN:  [id number]  /  DOB:  Aug 15, 1946   Dear Rosanne Ashing:   I just wanted to let you know that Mr. Mckelvin had not followed up  with our office about his echo.  Please let me know if there is anything  further we can do to assist in his care.    Sincerely,      Duke Salvia, MD, [facility]  Electronically Signed    SCK/MedQ  DD: 05/27/2007  DT: 05/28/2007  Job #: [id number]

## 2011-02-03 NOTE — Op Note (Signed)
NAME:  Dennis Hill, [place]                    ACCOUNT NO.:  [id number]   MEDICAL RECORD NO.:  [id number]                   PATIENT TYPE:  AMB   LOCATION:  DAY                                  FACILITY:  [facility]   PHYSICIAN:  Sharlet Salina T. Hoxworth, M.D.          DATE OF BIRTH:  1946-07-18   DATE OF PROCEDURE:  02/12/2003  DATE OF DISCHARGE:                                 OPERATIVE REPORT   PREOPERATIVE DIAGNOSIS:  Cholelithiasis, cholecystitis.   POSTOPERATIVE DIAGNOSIS:  Cholelithiasis, cholecystitis.   OPERATION/PROCEDURE:  Laparoscopic cholecystectomy.   SURGEON:  Lorne Skeens. Hoxworth, M.D.   ANESTHESIA:  General.   BRIEF HISTORY:  Dennis Hill is a 65 year old white male who presents  with two weeks of persistent discomfort in the epigastrium and right upper  quadrant.  He has had a workup including CT scan of the abdomen and  ultrasound of the abdomen which reveals multiple gallstones in a somewhat  thick-walled gallbladder.  He has tenderness in the right upper quadrant.  LFTs are normal.  He is felt to have ongoing cholecystitis and laparoscopic  cholecystectomy has been recommended and accepted.  The nature of this  procedure, its indications, risks of bleeding, infection, and bile duct  injury were discussed and understood preoperatively.  He is now brought to  the operating room for this procedure.   DESCRIPTION OF PROCEDURE:  The patient was brought to the operating room and  placed in the supine position on the operating table.  General endotracheal  anesthesia was induced.  The abdomen was sterilely prepped and draped.  He  received preoperative antibiotics.  PS were placed.  Local anesthesia was  used to infiltrate the trocar sites prior to the incisions.  A 1 cm incision  was made at the umbilicus and the Hasson trocar inserted with an open  technique through a mattress suture of 0 Vicryl.  Under direct vision a 10  mm trocar was placed in the subxiphoid  area and two 5 mm trocars on the  right subcostal margin.  The gallbladder was visualized and did appear  inflamed and edematous.  The fundus was grasped and elevated up over the  liver.  Omental adhesions were stripped down off the fundus which was  grasped and elevated and retracted laterally.  Peritoneum anterior and  posterior to Calot's triangle was incised.  Fibrofatty tissue was stripped  off the neck of the gallbladder toward the porta hepatis.  Calot's triangle  was thoroughly dissected.  The distal gallbladder was dissected and cystic  duct identified.  Dissected over about 1 cm.  The cystic duct/gallbladder  junction was dissected 360 degrees and the cystic artery identified in  Calot's triangle.  When the anatomy was clear, the cystic duct was doubly  clipped proximally, clipped distally and divided.  Anterior and posterior  branches of the cystic artery were divided between clips close to the  gallbladder.  The gallbladder was then dissected free from its  bed with hook  cautery.  Complete hemostasis was obtained in the gallbladder bed.  The  gallbladder was removed through the umbilicus after extracting multiple  large stones.  The right upper quadrant was irrigated and suctioned until  clear.  Complete hemostasis was assured.  Trocars were  removed under direct vision.  The mattress suture was secured at the  umbilicus.  The skin incisions were closed with interrupted subcuticular 4-0  Monocryl and Steri-Strips.  Sponge, needle and instrument counts were  correct.  Dry sterile dressings were applied and the patient taken to the  recovery room in good condition.                                                Lorne Skeens. Hoxworth, M.D.    Tory Emerald  D:  02/12/2003  T:  02/12/2003  Job:  [id number]

## 2011-02-06 ENCOUNTER — Telehealth: Payer: Self-pay | Admitting: *Deleted

## 2011-02-06 ENCOUNTER — Other Ambulatory Visit (INDEPENDENT_AMBULATORY_CARE_PROVIDER_SITE_OTHER): Payer: Medicare Other

## 2011-02-06 DIAGNOSIS — Z79899 Other long term (current) drug therapy: Secondary | ICD-10-CM

## 2011-02-06 DIAGNOSIS — E119 Type 2 diabetes mellitus without complications: Secondary | ICD-10-CM

## 2011-02-06 DIAGNOSIS — R1011 Right upper quadrant pain: Secondary | ICD-10-CM

## 2011-02-06 LAB — HEMOGLOBIN A1C: Hgb A1c MFr Bld: 6.8 % — ABNORMAL HIGH (ref 4.6–6.5)

## 2011-02-06 LAB — GLUCOSE, RANDOM: Glucose, Bld: 112 mg/dL — ABNORMAL HIGH (ref 70–99)

## 2011-02-06 NOTE — Telephone Encounter (Signed)
Message copied by Jesse Fall on Mon Feb 06, 2011  1:31 PM ------      Message from: [place], [place]      Created: Mon Feb 06, 2011 11:53 AM       Please call pt with abnormal Hgb A1c 6.8, was 6.4 last year, was 6. 2 years ago, also FBS elevated  At 112. Needs to f/u with his PCP on that.

## 2011-02-06 NOTE — Telephone Encounter (Signed)
Patient notified of lab results and told to follow up with his PCP(Dr. Jonny Ruiz).

## 2011-02-10 ENCOUNTER — Ambulatory Visit (INDEPENDENT_AMBULATORY_CARE_PROVIDER_SITE_OTHER): Payer: Medicare Other | Admitting: Internal Medicine

## 2011-02-10 ENCOUNTER — Encounter: Payer: Self-pay | Admitting: Internal Medicine

## 2011-02-10 VITALS — BP 112/62 | HR 72 | Temp 98.1°F | Ht 77.0 in | Wt 289.1 lb

## 2011-02-10 DIAGNOSIS — G4710 Hypersomnia, unspecified: Secondary | ICD-10-CM

## 2011-02-10 DIAGNOSIS — E785 Hyperlipidemia, unspecified: Secondary | ICD-10-CM | POA: Insufficient documentation

## 2011-02-10 DIAGNOSIS — E119 Type 2 diabetes mellitus without complications: Secondary | ICD-10-CM

## 2011-02-10 HISTORY — DX: Hyperlipidemia, unspecified: E78.5

## 2011-02-10 MED ORDER — ESOMEPRAZOLE MAGNESIUM 40 MG PO CPDR: 40.0000 mg | DELAYED_RELEASE_CAPSULE | Freq: Every day | ORAL | Status: DC

## 2011-02-10 NOTE — Assessment & Plan Note (Signed)
With mild worsening a1c; d/w pt, will hold on OHA for now, refer to DM education, f/u in 6 mo

## 2011-02-10 NOTE — Patient Instructions (Signed)
Continue all other medications as before You will be contacted regarding the referral for: Diabetes education class Please keep your appointments with your specialists as you have planned Please return in 6 months with Labs prior

## 2011-02-10 NOTE — Assessment & Plan Note (Addendum)
D/w pt, again declines eval for OSA though I explaines without eval and tx he may be less prone to success with exercise and wt loss due to fatigue

## 2011-02-13 ENCOUNTER — Encounter: Payer: Self-pay | Admitting: Internal Medicine

## 2011-02-13 NOTE — Progress Notes (Signed)
  Subjective:    Patient ID: Dennis Hill, male    DOB: 1946-03-04, 65 y.o.   MRN: [id number]  HPI  Here to f/u with minor elev of a1c today, quite nervous and agitated with very concerned wife present, after he was told to make appt asap per GI.  Pt denies chest pain, increased sob or doe, wheezing, orthopnea, PND, increased LE swelling, palpitations, dizziness or syncope.  Pt denies new neurological symptoms such as new headache, or facial or extremity weakness or numbness   Pt denies polydipsia, polyuria, or low sugar symptoms such as weakness or confusion improved with po intake.  Pt states overall good compliance with meds, trying to follow lower cholesterol diet, wt overall stable but little exercise however.   No other new concerns. Past Medical History  Diagnosis Date  . DIABETES MELLITUS, TYPE II 01/28/2010  . DEPRESSION 03/15/2009  . GERD 03/15/2009  . DIVERTICULOSIS, COLON 03/15/2009  . 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 FOOT PAIN, LEFT 04/05/2010  . RUQ PAIN 01/28/2010  . Hx of adenomatous colonic polyps 03/15/2009  . NEPHROLITHIASIS, HX OF 01/28/2010  . Atrial bigeminy 2008    Dr. Graciela Husbands  . Anxiety   . Chronic insomnia   . Fatty liver   . Internal hemorrhoids   . Hyperlipidemia 02/10/2011   Past Surgical History  Procedure Date  . Appendectomy   . Cholecystectomy 2006  . Tonsillectomy   . Inguinal herniorrhapy   . Hernia repair 1986    right inguinal    reports that he has quit smoking. He has never used smokeless tobacco. He reports that he drinks alcohol. He reports that he does not use illicit drugs. family history includes Colon cancer (age of onset:70) in his mother. No Known Allergies  Review of Systems All otherwise neg per pt     Objective:   Physical Exam BP 112/62  Pulse 72  Temp(Src) 98.1 F (36.7 C) (Oral)  Ht 6\' 5"  (1.956 m)  Wt 289 lb 2 oz (131.146 kg)  BMI 34.29 kg/m2  SpO2 94% Physical Exam  VS  noted Constitutional: Pt appears well-developed and well-nourished.  HENT: Head: Normocephalic.  Right Ear: External ear normal.  Left Ear: External ear normal.  Eyes: Conjunctivae and EOM are normal. Pupils are equal, round, and reactive to light.  Neck: Normal range of motion. Neck supple.  Cardiovascular: Normal rate and regular rhythm.   Pulmonary/Chest: Effort normal and breath sounds normal.  Neurological: Pt is alert. No cranial nerve deficit.  Skin: Skin is warm. No erythema.  Psychiatric: Pt behavior is normal. Thought content normal. 2+ nervous       Assessment & Plan:

## 2011-02-13 NOTE — Assessment & Plan Note (Signed)
stable overall by hx and exam, most recent lab reviewed with pt, and pt to continue medical treatment as before  Lab Results  Component Value Date   LDLCALC 122* 12/13/2010   Also c/w pt, declines statin, will consider as he sees recent labs as "wakeup call"  And states will do better with diet , exercise, wt loss

## 2011-02-16 ENCOUNTER — Telehealth: Payer: Self-pay | Admitting: Internal Medicine

## 2011-02-16 NOTE — Telephone Encounter (Signed)
Spoke with patient. He has had a family emergency and has to cancel tomorrow's procedure. Rescheduled at [facility] on 03/08/11 12:30 arrival for 1:30 PM procedure.

## 2011-02-17 ENCOUNTER — Encounter: Payer: Medicare Other | Admitting: Internal Medicine

## 2011-03-06 ENCOUNTER — Telehealth: Payer: Self-pay | Admitting: Internal Medicine

## 2011-03-06 NOTE — Telephone Encounter (Signed)
Does he qualify for late cancellation? If he does, I would charge him.

## 2011-03-07 ENCOUNTER — Encounter: Payer: Self-pay | Admitting: Internal Medicine

## 2011-03-07 NOTE — Telephone Encounter (Signed)
Error

## 2011-03-08 ENCOUNTER — Encounter: Payer: Medicare Other | Admitting: Internal Medicine

## 2011-04-18 ENCOUNTER — Encounter: Payer: Self-pay | Admitting: Internal Medicine

## 2011-04-18 ENCOUNTER — Ambulatory Visit: Payer: Medicare Other | Admitting: Internal Medicine

## 2011-04-18 VITALS — BP 127/79 | HR 63 | Temp 97.5°F | Resp 20 | Ht 77.0 in | Wt 278.0 lb

## 2011-04-18 DIAGNOSIS — K2980 Duodenitis without bleeding: Secondary | ICD-10-CM

## 2011-04-18 DIAGNOSIS — K210 Gastro-esophageal reflux disease with esophagitis: Secondary | ICD-10-CM

## 2011-04-18 DIAGNOSIS — R1011 Right upper quadrant pain: Secondary | ICD-10-CM

## 2011-04-18 MED ORDER — SODIUM CHLORIDE 0.9 % IV SOLN: 500.0000 mL | INTRAVENOUS | Status: DC

## 2011-04-18 MED ORDER — ESOMEPRAZOLE MAGNESIUM 40 MG PO CPDR: 40.0000 mg | DELAYED_RELEASE_CAPSULE | Freq: Every day | ORAL | Status: DC

## 2011-04-18 NOTE — Patient Instructions (Signed)
Discharge instructions given with verbal understanding. Handout on diverticulosis and a high fiber diet given. Resume previous medications. 

## 2011-04-19 ENCOUNTER — Telehealth: Payer: Self-pay

## 2011-04-19 ENCOUNTER — Encounter: Payer: Medicare Other | Admitting: Internal Medicine

## 2011-04-19 NOTE — Telephone Encounter (Signed)
No ID on answering machine.  No message left. 

## 2011-04-24 ENCOUNTER — Encounter: Payer: Self-pay | Admitting: Internal Medicine

## 2011-04-25 NOTE — Progress Notes (Signed)
  Patient was seen on 04/26/11 for the first of a series of three diabetes self-management courses at [facility]. The following learning objectives were met by the patient during this course:   Defines diabetes and the role of insulin  Identifies type of diabetes and pathophysiology  States normal BG range and personal goals  Identifies three risk factors for the development of diabetes  States the need for and frequency of healthcare follow up (ADA Standards of Care)  Lab Results  Component Value Date   HGBA1C 6.8* 02/06/2011    Patient has established the following initial goals:  Increase exercise  Follow-Up Plan: Pt elected not to sign up for additional education at this time. To call with questions or follow-up education.

## 2011-04-26 ENCOUNTER — Encounter: Payer: Medicare Other | Attending: Internal Medicine

## 2011-04-26 DIAGNOSIS — Z713 Dietary counseling and surveillance: Secondary | ICD-10-CM | POA: Insufficient documentation

## 2011-04-26 DIAGNOSIS — E119 Type 2 diabetes mellitus without complications: Secondary | ICD-10-CM | POA: Insufficient documentation

## 2011-06-06 ENCOUNTER — Other Ambulatory Visit: Payer: Self-pay | Admitting: Dermatology

## 2011-12-12 ENCOUNTER — Other Ambulatory Visit: Payer: Self-pay | Admitting: Dermatology

## 2012-10-21 ENCOUNTER — Telehealth: Payer: Self-pay

## 2012-10-21 DIAGNOSIS — Z0000 Encounter for general adult medical examination without abnormal findings: Secondary | ICD-10-CM

## 2012-10-21 NOTE — Telephone Encounter (Signed)
Lab order entered for cpx labs.

## 2012-12-05 ENCOUNTER — Other Ambulatory Visit: Payer: Self-pay | Admitting: Internal Medicine

## 2012-12-09 ENCOUNTER — Other Ambulatory Visit (INDEPENDENT_AMBULATORY_CARE_PROVIDER_SITE_OTHER): Payer: Medicare Other

## 2012-12-09 DIAGNOSIS — Z0000 Encounter for general adult medical examination without abnormal findings: Secondary | ICD-10-CM

## 2012-12-09 DIAGNOSIS — E119 Type 2 diabetes mellitus without complications: Secondary | ICD-10-CM

## 2012-12-09 LAB — BASIC METABOLIC PANEL
CO2: 27 mEq/L (ref 19–32)
Calcium: 9.7 mg/dL (ref 8.4–10.5)
Glucose, Bld: 125 mg/dL — ABNORMAL HIGH (ref 70–99)
Potassium: 4.8 mEq/L (ref 3.5–5.1)
Sodium: 138 mEq/L (ref 135–145)

## 2012-12-09 LAB — LIPID PANEL
Cholesterol: 145 mg/dL (ref 0–200)
Triglycerides: 76 mg/dL (ref 0.0–149.0)

## 2012-12-09 LAB — URINALYSIS, ROUTINE W REFLEX MICROSCOPIC
Ketones, ur: NEGATIVE
Specific Gravity, Urine: >=1.03 (ref 1.000–1.030)
Total Protein, Urine: NEGATIVE
Urine Glucose: NEGATIVE

## 2012-12-09 LAB — HEPATIC FUNCTION PANEL
AST: 25 U/L (ref 0–37)
Alkaline Phosphatase: 70 U/L (ref 39–117)
Bilirubin, Direct: 0.2 mg/dL (ref 0.0–0.3)
Total Protein: 6.7 g/dL (ref 6.0–8.3)

## 2012-12-09 LAB — CBC WITH DIFFERENTIAL/PLATELET
Basophils Absolute: 0.1 10*3/uL (ref 0.0–0.1)
Eosinophils Relative: 3.3 % (ref 0.0–5.0)
Lymphocytes Relative: 29.6 % (ref 12.0–46.0)
Lymphs Abs: 2.1 10*3/uL (ref 0.7–4.0)
Monocytes Relative: 9.2 % (ref 3.0–12.0)
Neutrophils Relative %: 57.1 % (ref 43.0–77.0)
Platelets: 209 10*3/uL (ref 150.0–400.0)
RDW: 14 % (ref 11.5–14.6)
WBC: 7.2 10*3/uL (ref 4.5–10.5)

## 2012-12-10 ENCOUNTER — Ambulatory Visit: Payer: Medicare Other

## 2012-12-10 DIAGNOSIS — E119 Type 2 diabetes mellitus without complications: Secondary | ICD-10-CM

## 2012-12-11 ENCOUNTER — Encounter: Payer: Self-pay | Admitting: Internal Medicine

## 2012-12-11 ENCOUNTER — Ambulatory Visit (INDEPENDENT_AMBULATORY_CARE_PROVIDER_SITE_OTHER): Payer: Medicare Other | Admitting: Internal Medicine

## 2012-12-11 VITALS — BP 124/70 | HR 79 | Temp 98.0°F | Ht 77.0 in | Wt 285.4 lb

## 2012-12-11 DIAGNOSIS — I4892 Unspecified atrial flutter: Secondary | ICD-10-CM | POA: Insufficient documentation

## 2012-12-11 DIAGNOSIS — M25569 Pain in unspecified knee: Secondary | ICD-10-CM

## 2012-12-11 DIAGNOSIS — M5430 Sciatica, unspecified side: Secondary | ICD-10-CM

## 2012-12-11 DIAGNOSIS — I7300 Raynaud's syndrome without gangrene: Secondary | ICD-10-CM

## 2012-12-11 DIAGNOSIS — E119 Type 2 diabetes mellitus without complications: Secondary | ICD-10-CM

## 2012-12-11 DIAGNOSIS — R972 Elevated prostate specific antigen [PSA]: Secondary | ICD-10-CM

## 2012-12-11 DIAGNOSIS — M25562 Pain in left knee: Secondary | ICD-10-CM | POA: Insufficient documentation

## 2012-12-11 DIAGNOSIS — Z0000 Encounter for general adult medical examination without abnormal findings: Secondary | ICD-10-CM | POA: Insufficient documentation

## 2012-12-11 DIAGNOSIS — I4891 Unspecified atrial fibrillation: Secondary | ICD-10-CM

## 2012-12-11 DIAGNOSIS — M5432 Sciatica, left side: Secondary | ICD-10-CM

## 2012-12-11 MED ORDER — CARVEDILOL 3.125 MG PO TABS: 3.1250 mg | ORAL_TABLET | Freq: Two times a day (BID) | ORAL | Status: DC

## 2012-12-11 MED ORDER — OMEPRAZOLE 20 MG PO TBEC: 1.0000 | DELAYED_RELEASE_TABLET | Freq: Two times a day (BID) | ORAL | Status: DC

## 2012-12-11 NOTE — Assessment & Plan Note (Signed)
prob by hx, exam benign, to avoid cold weather,  to f/u any worsening symptoms or concerns

## 2012-12-11 NOTE — Assessment & Plan Note (Signed)

## 2012-12-11 NOTE — Assessment & Plan Note (Signed)
Seems c/w possible meniscal injury now improved, for ortho referral but may need conserv tx until cardiac improved

## 2012-12-11 NOTE — Progress Notes (Signed)
Subjective:    Patient ID: Dennis Hill, male    DOB: 1946/03/15, 67 y.o.   MRN: [id number]  HPI  Here for wellness and f/u;  Overall doing ok;  Pt denies CP, worsening SOB, DOE, wheezing, orthopnea, PND, worsening LE edema, palpitations, or syncope.  Pt denies neurological change such as new headache, facial or extremity weakness.  Pt denies polydipsia, polyuria, or low sugar symptoms. Pt states overall good compliance with treatment and medications, good tolerability, and has been trying to follow lower cholesterol diet.  Pt denies worsening depressive symptoms, suicidal ideation or panic. No fever, night sweats, wt loss, loss of appetite, or other constitutional symptoms.  Pt states good ability with ADL's, has low fall risk, home safety reviewed and adequate, no other significant changes in hearing or vision, and only occasionally active with exercise.  ECG today c/w afib/flutter, new onset, asympt except for mild dizziness with lifting and yardwork 4 days ago, with mild tachycardia.  Also mentions a torque injury to left knee last aug 2013 with mowing the yard with 4 mo pain now improved, but swellign persists mild and pain with going up steps.  Also - Pt continues to have recurring left LBP without change in severity, adn no bowel or bladder change, fever, wt loss,  worsening LE pain/numbness/weakness, gait change or falls except for recurring left sciatica like pain since aug 2013, mild.  Also mentions 3rd finger right hand recently with several episodes raynauds like color change and coolness with cold weather.  Also nexium now too expensive on his drug plan Past Medical History  Diagnosis Date  . DIABETES MELLITUS, TYPE II 01/28/2010  . DEPRESSION 03/15/2009  . GERD 03/15/2009  . DIVERTICULOSIS, COLON 03/15/2009  . 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 FOOT PAIN, LEFT 04/05/2010  . RUQ PAIN 01/28/2010  . Hx of adenomatous colonic polyps 03/15/2009  .  NEPHROLITHIASIS, HX OF 01/28/2010  . Atrial bigeminy 2008    Dr. Graciela Husbands  . Anxiety   . Chronic insomnia   . Fatty liver   . Internal hemorrhoids   . Hyperlipidemia 02/10/2011   Past Surgical History  Procedure Laterality Date  . Appendectomy    . Cholecystectomy  2006  . Tonsillectomy    . Inguinal herniorrhapy    . Hernia repair  1986    right inguinal    reports that he has quit smoking. He has never used smokeless tobacco. He reports that he drinks about 2.4 ounces of alcohol per week. He reports that he does not use illicit drugs. family history includes Colon cancer (age of onset: 72) in his mother. No Known Allergies Current Outpatient Prescriptions on File Prior to Visit  Medication Sig Dispense Refill  . aspirin 81 MG EC tablet Take 81 mg by mouth daily.        Marland Kitchen esomeprazole (NEXIUM) 40 MG capsule Take 1 capsule (40 mg total) by mouth daily.  90 capsule  3  . esomeprazole (NEXIUM) 40 MG capsule Take 1 capsule (40 mg total) by mouth daily before breakfast.  30 capsule  6   Current Facility-Administered Medications on File Prior to Visit  Medication Dose Route Frequency Provider Last Rate Last Dose  . 0.9 %  sodium chloride infusion  500 mL Intravenous Continuous Hart Carwin, MD       Review of Systems Constitutional: Negative for diaphoresis, activity change, appetite change or unexpected weight change.  HENT: Negative for hearing loss, ear pain, facial  swelling, mouth sores and neck stiffness.   Eyes: Negative for pain, redness and visual disturbance.  Respiratory: Negative for shortness of breath and wheezing.   Cardiovascular: Negative for chest pain and palpitations.  Gastrointestinal: Negative for diarrhea, blood in stool, abdominal distention or other pain Genitourinary: Negative for hematuria, flank pain or change in urine volume.  Musculoskeletal: Negative for myalgias and joint swelling.  Skin: Negative for color change and wound.  Neurological: Negative for  syncope and numbness. other than noted Hematological: Negative for adenopathy.  Psychiatric/Behavioral: Negative for hallucinations, self-injury, decreased concentration and agitation.      Objective:   Physical Exam BP 124/70  Pulse 79  Temp(Src) 98 F (36.7 C) (Oral)  Ht 6\' 5"  (1.956 m)  Wt 285 lb 6 oz (129.445 kg)  BMI 33.83 kg/m2  SpO2 94% VS noted,  Constitutional: Pt is oriented to person, place, and time. Appears well-developed and well-nourished.  Head: Normocephalic and atraumatic.  Right Ear: External ear normal.  Left Ear: External ear normal.  Nose: Nose normal.  Mouth/Throat: Oropharynx is clear and moist.  Eyes: Conjunctivae and EOM are normal. Pupils are equal, round, and reactive to light.  Neck: Normal range of motion. Neck supple. No JVD present. No tracheal deviation present.  Cardiovascular: tachy rate, irregular rhythm, normal heart sounds and intact distal pulses.   Pulmonary/Chest: Effort normal and breath sounds normal.  Abdominal: Soft. Bowel sounds are normal. There is no tenderness. No HSM  Musculoskeletal: Normal range of motion. Exhibits no edema.  Lymphadenopathy:  Has no cervical adenopathy.  Neurological: Pt is alert and oriented to person, place, and time. Pt has normal reflexes. No cranial nerve deficit. Motor/dtr intact Left knee with trace effusion, decreased ROM, and tender over medial joint line Skin: Skin is warm and dry. No rash noted. No finger coolness or current color change Spine: nontender but has some tender to left buttock about the SI joint area Psychiatric:  Has  normal mood and affect. Behavior is normal.     Assessment & Plan:

## 2012-12-11 NOTE — Assessment & Plan Note (Addendum)
ECG reviewed as per emr, new onset, minimal symtpoms, for coreg low dose bid for better rate control, refer cardiology, hold anticoag for now, would not be eligible for immed cardioversion at this point, but would like card to confirm ecg prior to start anticoagulation such as eliquis or pradaxa

## 2012-12-11 NOTE — Patient Instructions (Addendum)
Please take all new medication as prescribed - the coreg twice per day, and the omeprazole Please continue all other medications as before, and refills have been done if requested. - the aspirin 81 mg for now Your EKG was explained today You will be contacted regarding the referral for: Cardiology (hopefully very soon), at which time you may need to start a blood thinner You will be contacted regarding the referral for: orthopedic for the left knee and the left sciatica Your lab work was explained today Thank you for enrolling in MyChart. Please follow the instructions below to securely access your online medical record. MyChart allows you to send messages to your doctor, view your test results, renew your prescriptions, schedule appointments, and more. To Log into My Chart online, please go by [employer] or [employer] to [email or web address], or download the MyChart App from the [employer] of [employer] .  Your Username is: [email or web address] ([email or web address]) Please send a practice Message on Mychart later today. Please return in 6 months, or sooner if needed, with Lab testing done 3-5 days before - including the a1c and related labs, but also the follow up PSA as well Please avoid colder weather with the cold finger (raynaud's phenomenon)

## 2012-12-11 NOTE — Assessment & Plan Note (Signed)
Mild, no neuro change, ok to follow for now with holding MRI, but refer ortho as well

## 2012-12-11 NOTE — Assessment & Plan Note (Signed)
stable overall by history and exam, recent data reviewed with pt, and pt to continue medical treatment as before,  to f/u any worsening symptoms or concerns Lab Results  Component Value Date   HGBA1C 6.7* 12/10/2012

## 2012-12-11 NOTE — Assessment & Plan Note (Signed)
Borderline signficant, for f/u PSA in 6 mo with next labs

## 2012-12-24 ENCOUNTER — Ambulatory Visit (INDEPENDENT_AMBULATORY_CARE_PROVIDER_SITE_OTHER): Payer: Medicare Other | Admitting: Cardiovascular Disease

## 2012-12-24 ENCOUNTER — Encounter: Payer: Self-pay | Admitting: Cardiovascular Disease

## 2012-12-24 VITALS — BP 158/106 | HR 104 | Ht 77.0 in | Wt 285.0 lb

## 2012-12-24 DIAGNOSIS — I4891 Unspecified atrial fibrillation: Secondary | ICD-10-CM

## 2012-12-24 MED ORDER — DILTIAZEM HCL ER COATED BEADS 120 MG PO CP24: 120.0000 mg | ORAL_CAPSULE | Freq: Every day | ORAL | Status: DC

## 2012-12-24 MED ORDER — RIVAROXABAN 20 MG PO TABS: 20.0000 mg | ORAL_TABLET | Freq: Every day | ORAL | Status: DC

## 2012-12-24 NOTE — Patient Instructions (Addendum)
Your physician recommends that you schedule a follow-up appointment in:  1 month with Dr. Graciela Husbands  Your physician has requested that you have an echocardiogram. Echocardiography is a painless test that uses sound waves to create images of your heart. It provides your doctor with information about the size and shape of your heart and how well your heart's chambers and valves are working. This procedure takes approximately one hour. There are no restrictions for this procedure.   Your physician has recommended you make the following change in your medication:  Start Cardizem CD 120 mg by mouth daily. Start Xarelto 20 mg by mouth daily

## 2012-12-24 NOTE — Progress Notes (Signed)
History of Present Illness: 67 yo male with history of atrial fibrillation, diet controlled DM who has been seen in the past for atrial arrythmias by Dr. Graciela Husbands. He has not been seen in our office since 2008. He has had recent dizziness and was found to have atrial fibrillation/flutter when seen in primary care by Dr. Jonny Ruiz on 12/11/12. He was started on ASA 81 mg po Qdaily and Coreg 3.125 mg po BID but he has not taken either medication. He reports taking the Coreg for several days but he felt fatigued so he stopped it. Overall feeling well. Denies weakness, fatigue, chest pain, SOB, awareness of palpitations. No dizziness, near syncope or syncope. He had been followed in our office by Dr. Graciela Husbands with last visit 02/20/07 and pt noted to have atrial bigeminy at that time.   Primary Care Physician: Oliver Barre  Last Lipid Profile:Lipid Panel     Component Value Date/Time   CHOL 145 12/09/2012 0958   TRIG 76.0 12/09/2012 0958   HDL 29.30* 12/09/2012 0958   CHOLHDL 5 12/09/2012 0958   VLDL 15.2 12/09/2012 0958   LDLCALC 101* 12/09/2012 0958     Past Medical History  Diagnosis Date  . DIABETES MELLITUS, TYPE II 01/28/2010    diet controlled  . DEPRESSION 03/15/2009  . GERD 03/15/2009  . DIVERTICULOSIS, COLON 03/15/2009  . 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 FOOT PAIN, LEFT 04/05/2010  . RUQ PAIN 01/28/2010  . Hx of adenomatous colonic polyps 03/15/2009  . NEPHROLITHIASIS, HX OF 01/28/2010  . Atrial bigeminy 2008    Dr. Graciela Husbands  . Anxiety   . Chronic insomnia   . Fatty liver   . Internal hemorrhoids   . Hyperlipidemia 02/10/2011    Past Surgical History  Procedure Laterality Date  . Appendectomy    . Cholecystectomy  2006  . Tonsillectomy    . Inguinal herniorrhapy    . Hernia repair  1986    right inguinal    Current Outpatient Prescriptions  Medication Sig Dispense Refill  . diltiazem (CARDIZEM CD) 120 MG 24 hr capsule Take 1 capsule (120 mg total) by  mouth daily.  30 capsule  6  . Rivaroxaban (XARELTO) 20 MG TABS Take 1 tablet (20 mg total) by mouth daily.  30 tablet  6   Current Facility-Administered Medications  Medication Dose Route Frequency Provider Last Rate Last Dose  . 0.9 %  sodium chloride infusion  500 mL Intravenous Continuous Hart Carwin, MD        No Known Allergies  History   Social History  . Marital Status: Single    Spouse Name: N/A    Number of Children: 2  . Years of Education: N/A   Occupational History  . Manufacturers Rep for [employer]    Social History Main Topics  . Smoking status: Former Smoker -- 1.00 packs/day for 6 years    Types: Cigarettes    Quit date: 12/25/1979  . Smokeless tobacco: Never Used  . Alcohol Use: 2.4 oz/week    4 Glasses of wine per week     Comment: social  . Drug Use: No  . Sexually Active: Not on file   Other Topics Concern  . Not on file   Social History Narrative   Step daughter and 2 55 yo twins live with     Family History  Problem Relation Age of Onset  . Lung cancer Mother 60  . Colon cancer Mother   .  CAD Neg Hx   . Heart attack Brother     Review of Systems:  As stated in the HPI and otherwise negative.   BP 158/106  Pulse 104  Ht 6\' 5"  (1.956 m)  Wt 285 lb (129.275 kg)  BMI 33.79 kg/m2  Physical Examination: General: Well developed, well nourished, NAD HEENT: OP clear, mucus membranes moist SKIN: warm, dry. No rashes. Neuro: No focal deficits Musculoskeletal: Muscle strength 5/5 all ext Psychiatric: Mood and affect normal Neck: No JVD, no carotid bruits, no thyromegaly, no lymphadenopathy. Lungs:Clear bilaterally, no wheezes, rhonci, crackles Cardiovascular: Irregular irregular.  No murmurs, gallops or rubs. Abdomen:Soft. Bowel sounds present. Non-tender.  Extremities: No lower extremity edema. Pulses are 2 + in the bilateral DP/PT.  EKG: Atrial flutter. rate 104 bpm. Non-specific intraventricular conduction delay. Diffuse T wave  inversions.   Assessment and Plan:   1. Atrial fibrillation: I have reviewed the etiology of atrial fibrillation as well as the risks of CVA and treatment options. He has not continued his Coreg as advised in primary care. He feels that this medication made him tired and fatigued. Will start Cardizem CD 120 mg po QDaily for rate control. I have reviewed the risk of CVA. CHADs score of 1. (DM) While he is borderline in his need for long term anticoagulation, I have reviewed this in detail and he has chosen to start anti-coagulation. Will start Xarelto 20 mg po Qdaily. His CBC and BMET were within normal limits 2 weeks ago. TSH is normal. Will arrange echo to exclude structural heart disease. Will have him f/u with Dr. Graciela Husbands for further management of his atrial flutter. I would anticipate DCCV in one month if he remains in atrial fibrillation/flutter.

## 2012-12-27 ENCOUNTER — Ambulatory Visit (HOSPITAL_COMMUNITY): Payer: Medicare Other | Attending: Cardiology | Admitting: Radiology

## 2012-12-27 DIAGNOSIS — I4892 Unspecified atrial flutter: Secondary | ICD-10-CM

## 2012-12-27 DIAGNOSIS — I4891 Unspecified atrial fibrillation: Secondary | ICD-10-CM | POA: Insufficient documentation

## 2012-12-27 NOTE — Progress Notes (Signed)
Echocardiogram performed.  

## 2012-12-30 ENCOUNTER — Telehealth: Payer: Self-pay | Admitting: Internal Medicine

## 2012-12-30 ENCOUNTER — Telehealth: Payer: Self-pay | Admitting: Cardiovascular Disease

## 2012-12-30 MED ORDER — ESOMEPRAZOLE MAGNESIUM 40 MG PO CPDR: 40.0000 mg | DELAYED_RELEASE_CAPSULE | Freq: Every day | ORAL | Status: DC

## 2012-12-30 NOTE — Telephone Encounter (Signed)
Patient informed. 

## 2012-12-30 NOTE — Telephone Encounter (Signed)
New problem     Has questions & concerns regarding diltiazem 120 mg.

## 2012-12-30 NOTE — Telephone Encounter (Signed)
rx done for nexium

## 2012-12-30 NOTE — Telephone Encounter (Signed)
Follow Up ° ° ° ° ° °Pt calling in returning phone call from earlier. Please call. °

## 2012-12-30 NOTE — Telephone Encounter (Signed)
Left message to call back  

## 2012-12-30 NOTE — Telephone Encounter (Signed)
Pt was seen last week.  He picked up the RX for Omeprazole.  It is upsetting his stomach and not helping the acid reflux.  He used to take Nexium.

## 2013-01-06 ENCOUNTER — Encounter: Payer: Self-pay | Admitting: *Deleted

## 2013-01-06 NOTE — Telephone Encounter (Signed)
Follow Up   Pt calling in following up on ECHO results. Would like to speak to nurse.

## 2013-01-06 NOTE — Telephone Encounter (Signed)
I spoke with the patient. He is aware of his echo results. I also answered questions regarding diltiazem.

## 2013-01-28 ENCOUNTER — Other Ambulatory Visit: Payer: Self-pay

## 2013-01-28 DIAGNOSIS — I4891 Unspecified atrial fibrillation: Secondary | ICD-10-CM

## 2013-01-28 MED ORDER — RIVAROXABAN 20 MG PO TABS: 20.0000 mg | ORAL_TABLET | Freq: Every day | ORAL | Status: DC

## 2013-01-28 NOTE — Telephone Encounter (Signed)
pt called to rqst refill for xarelto. refill completed pt aware.

## 2013-02-04 ENCOUNTER — Encounter: Payer: Self-pay | Admitting: *Deleted

## 2013-02-04 ENCOUNTER — Ambulatory Visit (INDEPENDENT_AMBULATORY_CARE_PROVIDER_SITE_OTHER): Payer: Medicare Other | Admitting: Internal Medicine

## 2013-02-04 ENCOUNTER — Encounter: Payer: Self-pay | Admitting: Internal Medicine

## 2013-02-04 VITALS — BP 126/80 | HR 71 | Ht 77.0 in | Wt 283.0 lb

## 2013-02-04 DIAGNOSIS — I428 Other cardiomyopathies: Secondary | ICD-10-CM

## 2013-02-04 DIAGNOSIS — I420 Dilated cardiomyopathy: Secondary | ICD-10-CM

## 2013-02-04 DIAGNOSIS — I4892 Unspecified atrial flutter: Secondary | ICD-10-CM

## 2013-02-04 DIAGNOSIS — G4733 Obstructive sleep apnea (adult) (pediatric): Secondary | ICD-10-CM

## 2013-02-04 DIAGNOSIS — I429 Cardiomyopathy, unspecified: Secondary | ICD-10-CM | POA: Insufficient documentation

## 2013-02-04 LAB — CBC WITH DIFFERENTIAL/PLATELET
Basophils Relative: 0.6 % (ref 0.0–3.0)
Eosinophils Absolute: 0.3 10*3/uL (ref 0.0–0.7)
MCHC: 33.7 g/dL (ref 30.0–36.0)
MCV: 87.4 fl (ref 78.0–100.0)
Monocytes Absolute: 0.8 10*3/uL (ref 0.1–1.0)
Neutrophils Relative %: 64.2 % (ref 43.0–77.0)
RBC: 4.98 Mil/uL (ref 4.22–5.81)

## 2013-02-04 LAB — BASIC METABOLIC PANEL
BUN: 20 mg/dL (ref 6–23)
CO2: 28 mEq/L (ref 19–32)
Chloride: 106 mEq/L (ref 96–112)
Creatinine, Ser: 1.2 mg/dL (ref 0.4–1.5)
Glucose, Bld: 102 mg/dL — ABNORMAL HIGH (ref 70–99)

## 2013-02-04 MED ORDER — LISINOPRIL 5 MG PO TABS: ORAL_TABLET | ORAL | Status: DC

## 2013-02-04 NOTE — Progress Notes (Signed)
 ELECTROPHYSIOLOGY CONSULT NOTE  Patient ID: Dennis Hill, MRN: [id number], DOB/AGE: 01/26/1946 66 y.o. Admit date: (Not on file) Date of Consult: 02/04/2013  Primary Physician: James John, MD Primary Cardiologist: CM Chief Complaint:  fibrillation   HPI Dennis Hill is a 66 y.o. male whom I saw many years ago for what sounds like atrial ectopy who was recently discovered to be"atrial fibrillation".  This was noted on a routine office visit. He complained of having a few episodes of dyspnea on exertion. He had no chest discomfort.. He was treated at that time with aspirin and carvedilol the latter of which was discontinued because of fatigue and Cardizem was initiated. Since that time he has felt much better.    He has thromboembolic risk factors notable for diabetes and age for a CHADS2 score of around and a CHADS-VASc score of 2   Rivaroxaban was initiated. Echocardiogram had demonstrated ejection fraction 35-40% with mild-moderate left atrial enlargement    Past Medical History  Diagnosis Date  . DIABETES MELLITUS, TYPE II 01/28/2010    diet controlled  . DEPRESSION 03/15/2009  . GERD 03/15/2009  . DIVERTICULOSIS, COLON 03/15/2009  . 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 FOOT PAIN, LEFT 04/05/2010  . RUQ PAIN 01/28/2010  . Hx of adenomatous colonic polyps 03/15/2009  . NEPHROLITHIASIS, HX OF 01/28/2010  . Atrial bigeminy 2008    Dr. Klein  . Anxiety   . Chronic insomnia   . Fatty liver   . Internal hemorrhoids   . Hyperlipidemia 02/10/2011      Surgical History:  Past Surgical History  Procedure Laterality Date  . Appendectomy    . Cholecystectomy  2006  . Tonsillectomy    . Inguinal herniorrhapy    . Hernia repair  1986    right inguinal     Home Meds: Prior to Admission medications   Medication Sig Start Date End Date Taking? Authorizing Provider  diltiazem (CARDIZEM CD) 120 MG 24 hr capsule Take 1 capsule (120 mg  total) by mouth daily. 12/24/12 12/24/13 Yes Christopher D McAlhany, MD  esomeprazole (NEXIUM) 40 MG capsule Take 1 capsule (40 mg total) by mouth daily before breakfast. 12/30/12  Yes James W John, MD  Rivaroxaban (XARELTO) 20 MG TABS Take 1 tablet (20 mg total) by mouth daily. 01/28/13  Yes Christopher D McAlhany, MD     Allergies: No Known Allergies  History   Social History  . Marital Status: Single    Spouse Name: N/A    Number of Children: 2  . Years of Education: N/A   Occupational History  . 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 Smoking status: Former Smoker -- 1.00 packs/day for 6 years    Types: Cigarettes    Quit date: 12/25/1979  . Smokeless tobacco: Never Used  . Alcohol Use: 2.4 oz/week    4 Glasses of wine per week     Comment: social  . Drug Use: No  . Sexually Active: Not on file   Other Topics Concern  . Not on file   Social History Narrative   Step daughter and 2 8 yo twins live with      Family History  Problem Relation Age of Onset  . Lung cancer Mother 70  . Colon cancer Mother   . CAD Neg Hx   . Heart attack Brother      ROS:  Please see the history of present   illness.     All other systems reviewed and negative.    Physical Exam:   Blood pressure 126/80, pulse 71, height 6' 5" (1.956 m), weight 283 lb (128.368 kg). General: Well developed, well nourished male in no acute distress. Head: Normocephalic, atraumatic, sclera non-icteric, no xanthomas, nares are without discharge. EENT: normal Lymph Nodes:  none Back: without scoliosis/kyphosis , no CVA tendersness Neck: Negative for carotid bruits. JVD 9-10  Lungs: Clear bilaterally to auscultation without wheezes, rales, or rhonchi. Breathing is unlabored. Heart: RRR with S1 S2. No murmur , rubs, or gallops appreciated. Abdomen: Soft, non-tender, non-distended with normoactive bowel sounds. No hepatomegaly. No rebound/guarding. No obvious abdominal masses. Msk:   Strength and tone appear normal for age. Extremities: No clubbing or cyanosis. 2+  edema.  Distal pedal pulses are 2+ and equal bilaterally. Skin: Warm and Dry Neuro: Alert and oriented X 3. CN III-XII intact Grossly normal sensory and motor function . Psych:  Responds to questions appropriately with a normal affect.      Labs: Cardiac Enzymes No results found for this basename: CKTOTAL, CKMB, TROPONINI,  in the last 72 hours CBC Lab Results  Component Value Date   WBC 7.2 12/09/2012   HGB 14.9 12/09/2012   HCT 42.9 12/09/2012   MCV 87.1 12/09/2012   PLT 209.0 12/09/2012   PROTIME: No results found for this basename: LABPROT, INR,  in the last 72 hours Chemistry No results found for this basename: NA, K, CL, CO2, BUN, CREATININE, CALCIUM, LABALBU, PROT, BILITOT, ALKPHOS, ALT, AST, GLUCOSE,  in the last 168 hours Lipids Lab Results  Component Value Date   CHOL 145 12/09/2012   HDL 29.30* 12/09/2012   LDLCALC 101* 12/09/2012   TRIG 76.0 12/09/2012   BNP No results found for this basename: probnp   Miscellaneous No results found for this basename: DDIMER    Radiology/Studies:  No results found.  EKG:  afklutter with variable  conduction  Assessment and Plan:  Is a  Steven Klein   

## 2013-02-04 NOTE — Assessment & Plan Note (Signed)
The patient was satisfied is having atrial flutter initially with a rapid rate. He was also noted to have a cardiomyopathy with an ejection fraction of 35-40% with prominent left atrial enlargement at 44 mm. He has been much improved on rate control. H he is beta blocker was switched to a calcium blocker because of fatigue. We will need to reassess left ventricular function which I hope to be related to have caused by the rapid atrial flutter and then make a decision about long-term beta blocker therapy. In the interim we will begin him on lisinopril for renal protection from his diabetes as well is given his cardiomyopathy) now, in the absence of symptoms of congestion, continue his calcium blocker.  We have discussed strategies for the control of atrial flutter and its potential associated with atrial fibrillation and/or aggravation associated with obstructive sleep apnea.  We will plan to undertake cardioversion in May and 3-4 weeks undertake catheter ablation. We have discussed risks and benefits of that procedure including not limited to bleeding and death and stroke. Will undertaken under general anesthesia.We will reassess left ventricular function around that time.

## 2013-02-04 NOTE — Assessment & Plan Note (Signed)
As above.

## 2013-02-04 NOTE — Patient Instructions (Signed)
Your physician has recommended you make the following change in your medication:  1) Start lisinopril 5 mg one tablet by mouth once daily  Your physician has recommended that you have a sleep study. This test records several body functions during sleep, including: brain activity, eye movement, oxygen and carbon dioxide blood levels, heart rate and rhythm, breathing rate and rhythm, the flow of air through your mouth and nose, snoring, body muscle movements, and chest and belly movement.  Your physician has recommended that you have a Cardioversion (DCCV). Electrical Cardioversion uses a jolt of electricity to your heart either through paddles or wired patches attached to your chest. This is a controlled, usually prescheduled, procedure. Defibrillation is done under light anesthesia in the hospital, and you usually go home the day of the procedure. This is done to get your heart back into a normal rhythm. You are not awake for the procedure. Please see the instruction sheet given to you today.  Your physician has requested that you have an echocardiogram the week of 02/24/13. Echocardiography is a painless test that uses sound waves to create images of your heart. It provides your doctor with information about the size and shape of your heart and how well your heart's chambers and valves are working. This procedure takes approximately one hour. There are no restrictions for this procedure.  Your physician has recommended that you have an ablation. Catheter ablation is a medical procedure used to treat some cardiac arrhythmias (irregular heartbeats). During catheter ablation, a long, thin, flexible tube is put into a blood vessel in your groin (upper thigh), or neck. This tube is called an ablation catheter. It is then guided to your heart through the blood vessel. Radio frequency waves destroy small areas of heart tissue where abnormal heartbeats may cause an arrhythmia to start. Please see the instruction  sheet given to you today.

## 2013-02-04 NOTE — Assessment & Plan Note (Signed)
The patient has sleep apnea by history. We'll undertake a sleep study. This is important impact on atrial arrhythmia recurrence.. Also discussed the importance of weight loss. We want to shoot for 36 pair of pants

## 2013-02-06 ENCOUNTER — Encounter (HOSPITAL_COMMUNITY): Payer: Self-pay | Admitting: Certified Registered"

## 2013-02-06 ENCOUNTER — Ambulatory Visit (HOSPITAL_COMMUNITY): Payer: Medicare Other | Admitting: Certified Registered"

## 2013-02-06 ENCOUNTER — Encounter (HOSPITAL_COMMUNITY)
Admission: RE | Disposition: A | Discharge status: Home / Self Care | Payer: Self-pay | Source: Ambulatory Visit | Attending: Cardiology

## 2013-02-06 ENCOUNTER — Encounter (HOSPITAL_COMMUNITY): Payer: Self-pay

## 2013-02-06 ENCOUNTER — Ambulatory Visit (HOSPITAL_COMMUNITY)
Admission: RE | Admit: 2013-02-06 | Discharge: 2013-02-06 | Disposition: A | Discharge status: Home / Self Care | Payer: Medicare Other | Source: Ambulatory Visit | Attending: Cardiology | Admitting: Cardiology

## 2013-02-06 DIAGNOSIS — E119 Type 2 diabetes mellitus without complications: Secondary | ICD-10-CM | POA: Insufficient documentation

## 2013-02-06 DIAGNOSIS — E785 Hyperlipidemia, unspecified: Secondary | ICD-10-CM | POA: Insufficient documentation

## 2013-02-06 DIAGNOSIS — F3289 Other specified depressive episodes: Secondary | ICD-10-CM | POA: Insufficient documentation

## 2013-02-06 DIAGNOSIS — I4891 Unspecified atrial fibrillation: Secondary | ICD-10-CM | POA: Insufficient documentation

## 2013-02-06 DIAGNOSIS — G4700 Insomnia, unspecified: Secondary | ICD-10-CM | POA: Insufficient documentation

## 2013-02-06 DIAGNOSIS — Z87891 Personal history of nicotine dependence: Secondary | ICD-10-CM | POA: Insufficient documentation

## 2013-02-06 DIAGNOSIS — I517 Cardiomegaly: Secondary | ICD-10-CM | POA: Insufficient documentation

## 2013-02-06 DIAGNOSIS — K5730 Diverticulosis of large intestine without perforation or abscess without bleeding: Secondary | ICD-10-CM | POA: Insufficient documentation

## 2013-02-06 DIAGNOSIS — Z79899 Other long term (current) drug therapy: Secondary | ICD-10-CM | POA: Insufficient documentation

## 2013-02-06 DIAGNOSIS — F411 Generalized anxiety disorder: Secondary | ICD-10-CM | POA: Insufficient documentation

## 2013-02-06 DIAGNOSIS — I420 Dilated cardiomyopathy: Secondary | ICD-10-CM

## 2013-02-06 DIAGNOSIS — K219 Gastro-esophageal reflux disease without esophagitis: Secondary | ICD-10-CM | POA: Insufficient documentation

## 2013-02-06 DIAGNOSIS — K7689 Other specified diseases of liver: Secondary | ICD-10-CM | POA: Insufficient documentation

## 2013-02-06 DIAGNOSIS — G4733 Obstructive sleep apnea (adult) (pediatric): Secondary | ICD-10-CM

## 2013-02-06 DIAGNOSIS — G4730 Sleep apnea, unspecified: Secondary | ICD-10-CM | POA: Insufficient documentation

## 2013-02-06 DIAGNOSIS — F329 Major depressive disorder, single episode, unspecified: Secondary | ICD-10-CM | POA: Insufficient documentation

## 2013-02-06 DIAGNOSIS — I4892 Unspecified atrial flutter: Secondary | ICD-10-CM

## 2013-02-06 DIAGNOSIS — Z7901 Long term (current) use of anticoagulants: Secondary | ICD-10-CM | POA: Insufficient documentation

## 2013-02-06 HISTORY — PX: CARDIOVERSION: SHX1299

## 2013-02-06 LAB — GLUCOSE, CAPILLARY: Glucose-Capillary: 99 mg/dL (ref 70–99)

## 2013-02-06 SURGERY — CARDIOVERSION
Anesthesia: Monitor Anesthesia Care

## 2013-02-06 MED ORDER — SODIUM CHLORIDE 0.9 % IV SOLN
INTRAVENOUS | Status: DC
  Administered 2013-02-06: 15:00:00 via INTRAVENOUS

## 2013-02-06 MED ORDER — METOPROLOL TARTRATE 1 MG/ML IV SOLN
INTRAVENOUS | Status: DC | PRN
  Administered 2013-02-06 (×5): 1 mg via INTRAVENOUS

## 2013-02-06 MED ORDER — PROPOFOL 10 MG/ML IV BOLUS
INTRAVENOUS | Status: DC | PRN
  Administered 2013-02-06: 100 mg via INTRAVENOUS

## 2013-02-06 MED ORDER — METOPROLOL TARTRATE 1 MG/ML IV SOLN
INTRAVENOUS | Status: AC
  Filled 2013-02-06: qty 5

## 2013-02-06 NOTE — Anesthesia Preprocedure Evaluation (Addendum)
Anesthesia Evaluation  Patient identified by MRN, date of birth, ID band Patient awake    Reviewed: Allergy & Precautions, H&P , NPO status , Patient's Chart, lab work & pertinent test results  History of Anesthesia Complications Negative for: history of anesthetic complications  Airway Mallampati: II TM Distance: >3 FB Neck ROM: Full    Dental  (+) Teeth Intact and Dental Advisory Given   Pulmonary sleep apnea ,  breath sounds clear to auscultation  Pulmonary exam normal       Cardiovascular Exercise Tolerance: Good hypertension, Pt. on medications + dysrhythmias Atrial Fibrillation Rhythm:Irregular Rate:Tachycardia  Echo 12/27/12: EF 35% to 40% with diffuse hypokinesis.    Neuro/Psych    GI/Hepatic GERD-  Medicated and Controlled,Fatty liver dz   Endo/Other  diabetes, Well Controlled, Type 2DM diet controlled  Renal/GU Renal diseaseRenal calculi Cr 1.2     Musculoskeletal negative musculoskeletal ROS (+)   Abdominal   Peds  Hematology negative hematology ROS (+)   Anesthesia Other Findings   Reproductive/Obstetrics                      Anesthesia Physical Anesthesia Plan  ASA: III  Anesthesia Plan: General   Post-op Pain Management:    Induction: Intravenous  Airway Management Planned: Mask  Additional Equipment:   Intra-op Plan:   Post-operative Plan:   Informed Consent: I have reviewed the patients History and Physical, chart, labs and discussed the procedure including the risks, benefits and alternatives for the proposed anesthesia with the patient or authorized representative who has indicated his/her understanding and acceptance.   Dental advisory given  Plan Discussed with:   Anesthesia Plan Comments:         Anesthesia Quick Evaluation

## 2013-02-06 NOTE — Anesthesia Procedure Notes (Signed)
Date/Time: 02/06/2013 3:36 PM Performed by: Angelica Pou Pre-anesthesia Checklist: Patient identified, Emergency Drugs available, Suction available, Patient being monitored and Timeout performed Patient Re-evaluated:Patient Re-evaluated prior to inductionOxygen Delivery Method: Ambu bag Preoxygenation: Pre-oxygenation with 100% oxygen Intubation Type: IV induction Ventilation: Oral airway inserted - appropriate to patient size and Mask ventilation without difficulty Dental Injury: Teeth and Oropharynx as per pre-operative assessment

## 2013-02-06 NOTE — H&P (View-Only) (Signed)
ELECTROPHYSIOLOGY CONSULT NOTE  Patient ID: Dennis Hill, MRN: [id number], DOB/AGE: 12/09/1945 67 y.o. Admit date: (Not on file) Date of Consult: 02/04/2013  Primary Physician: Oliver Barre, MD Primary Cardiologist: CM Chief Complaint:  fibrillation   HPI Dennis Hill is a 67 y.o. male whom I saw many years ago for what sounds like atrial ectopy who was recently discovered to be"atrial fibrillation".  This was noted on a routine office visit. He complained of having a few episodes of dyspnea on exertion. He had no chest discomfort.Marland Kitchen He was treated at that time with aspirin and carvedilol the latter of which was discontinued because of fatigue and Cardizem was initiated. Since that time he has felt much better.    He has thromboembolic risk factors notable for diabetes and age for a CHADS2 score of around and a CHADS-VASc score of 2   Rivaroxaban was initiated. Echocardiogram had demonstrated ejection fraction 35-40% with mild-moderate left atrial enlargement    Past Medical History  Diagnosis Date  . DIABETES MELLITUS, TYPE II 01/28/2010    diet controlled  . DEPRESSION 03/15/2009  . GERD 03/15/2009  . DIVERTICULOSIS, COLON 03/15/2009  . 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 FOOT PAIN, LEFT 04/05/2010  . RUQ PAIN 01/28/2010  . Hx of adenomatous colonic polyps 03/15/2009  . NEPHROLITHIASIS, HX OF 01/28/2010  . Atrial bigeminy 2008    Dr. Graciela Husbands  . Anxiety   . Chronic insomnia   . Fatty liver   . Internal hemorrhoids   . Hyperlipidemia 02/10/2011      Surgical History:  Past Surgical History  Procedure Laterality Date  . Appendectomy    . Cholecystectomy  2006  . Tonsillectomy    . Inguinal herniorrhapy    . Hernia repair  1986    right inguinal     Home Meds: Prior to Admission medications   Medication Sig Start Date End Date Taking? Authorizing Provider  diltiazem (CARDIZEM CD) 120 MG 24 hr capsule Take 1 capsule (120 mg  total) by mouth daily. 12/24/12 12/24/13 Yes Kathleene Hazel, MD  esomeprazole (NEXIUM) 40 MG capsule Take 1 capsule (40 mg total) by mouth daily before breakfast. 12/30/12  Yes Corwin Levins, MD  Rivaroxaban (XARELTO) 20 MG TABS Take 1 tablet (20 mg total) by mouth daily. 01/28/13  Yes Kathleene Hazel, MD     Allergies: No Known Allergies  History   Social History  . Marital Status: Single    Spouse Name: N/A    Number of Children: 2  . Years of Education: N/A   Occupational History  . Manufacturers Rep for [employer]    Social History Main Topics  . Smoking status: Former Smoker -- 1.00 packs/day for 6 years    Types: Cigarettes    Quit date: 12/25/1979  . Smokeless tobacco: Never Used  . Alcohol Use: 2.4 oz/week    4 Glasses of wine per week     Comment: social  . Drug Use: No  . Sexually Active: Not on file   Other Topics Concern  . Not on file   Social History Narrative   Step daughter and 2 43 yo twins live with      Family History  Problem Relation Age of Onset  . Lung cancer Mother 68  . Colon cancer Mother   . CAD Neg Hx   . Heart attack Brother      ROS:  Please see the history of present  illness.     All other systems reviewed and negative.    Physical Exam:   Blood pressure 126/80, pulse 71, height 6\' 5"  (1.956 m), weight 283 lb (128.368 kg). General: Well developed, well nourished male in no acute distress. Head: Normocephalic, atraumatic, sclera non-icteric, no xanthomas, nares are without discharge. EENT: normal Lymph Nodes:  none Back: without scoliosis/kyphosis , no CVA tendersness Neck: Negative for carotid bruits. JVD 9-10  Lungs: Clear bilaterally to auscultation without wheezes, rales, or rhonchi. Breathing is unlabored. Heart: RRR with S1 S2. No murmur , rubs, or gallops appreciated. Abdomen: Soft, non-tender, non-distended with normoactive bowel sounds. No hepatomegaly. No rebound/guarding. No obvious abdominal masses. Msk:   Strength and tone appear normal for age. Extremities: No clubbing or cyanosis. 2+  edema.  Distal pedal pulses are 2+ and equal bilaterally. Skin: Warm and Dry Neuro: Alert and oriented X 3. CN III-XII intact Grossly normal sensory and motor function . Psych:  Responds to questions appropriately with a normal affect.      Labs: Cardiac Enzymes No results found for this basename: CKTOTAL, CKMB, TROPONINI,  in the last 72 hours CBC Lab Results  Component Value Date   WBC 7.2 12/09/2012   HGB 14.9 12/09/2012   HCT 42.9 12/09/2012   MCV 87.1 12/09/2012   PLT 209.0 12/09/2012   PROTIME: No results found for this basename: LABPROT, INR,  in the last 72 hours Chemistry No results found for this basename: NA, K, CL, CO2, BUN, CREATININE, CALCIUM, LABALBU, PROT, BILITOT, ALKPHOS, ALT, AST, GLUCOSE,  in the last 168 hours Lipids Lab Results  Component Value Date   CHOL 145 12/09/2012   HDL 29.30* 12/09/2012   LDLCALC 101* 12/09/2012   TRIG 76.0 12/09/2012   BNP No results found for this basename: probnp   Miscellaneous No results found for this basename: DDIMER    Radiology/Studies:  No results found.  EKG:  afklutter with variable  conduction  Assessment and Plan:  Is a  Sherryl Manges

## 2013-02-06 NOTE — Interval H&P Note (Signed)
History and Physical Interval Note:  02/06/2013 4:09 PM  Dennis Hill  has presented today for surgery, with the diagnosis of a-flutter  The various methods of treatment have been discussed with the patient and family. After consideration of risks, benefits and other options for treatment, the patient has consented to  Procedure(s): CARDIOVERSION (N/A) as a surgical intervention .  The patient's history has been reviewed, patient examined, no change in status, stable for surgery.  I have reviewed the patient's chart and labs.  Questions were answered to the patient's satisfaction.     Lewayne Bunting

## 2013-02-06 NOTE — Op Note (Signed)
Procedure: DC CV Indication: Symptomatic atrial flutter  Findings: After informed consent was obtained, the patient was sedated under the direction of Dr. Randa Evens. He was cardioverted with 200 J of synchronized biphasic energy restoring NSR. He had frequent ventricular ectopy and was treated with IV lopressor.   Conclusion: Successful DCCV of atrial flutter without immediate complication.   Leonia Reeves.D.

## 2013-02-06 NOTE — Discharge Instructions (Signed)
Electrical Cardioversion  Cardioversion is the delivery of a jolt of electricity to change the rhythm of the heart. Sticky patches or metal paddles are placed on the chest to deliver the electricity from a special device. This is done to restore a normal rhythm. A rhythm that is too fast or not regular keeps the heart from pumping well.  Compared to medicines used to change an abnormal rhythm, cardioversion is faster and works better. It is also unpleasant and may dislodge blood clots from the heart.  WHEN WOULD THIS BE DONE?   In an emergency:   There is low or no blood pressure as a result of the heart rhythm.   Normal rhythm must be restored as fast as possible to protect the brain and heart from further damage.   It may save a life.   For less serious heart rhythms, such as atrial fibrillation or flutter, in which:   The heart is beating too fast or is not regular.   The heart is still able to pump enough blood, but not as well as it should.   Medicine to change the rhythm has not worked.   It is safe to wait in order to allow time for preparation.  LET YOUR CAREGIVER KNOW ABOUT:    Every medicine you are taking. It is very important to do this! Know when to take or stop taking any of them.   Any time in the past that you have felt your heart was not beating normally.  RISKS AND COMPLICATIONS    Clots may form in the chambers of the heart if it is beating too fast. These clots may be dislodged during the procedure and travel to other parts of the body.   There is risk of a stroke during and after the procedure if a clot moves. Blood thinners lower this risk.   You may have a special test of your heart (TEE) to make sure there are no clots in your heart.  BEFORE THE PROCEDURE    You may have some tests to see how well your heart is working.   You may start taking blood thinners so your blood does not clot as easily.   Other drugs may be given to help your heart work better.  PROCEDURE  (SCHEDULED)   The procedure is typically done in a hospital by a heart doctor (cardiologist).   You will be told when and where to go.   You may be given some medicine through an intravenous (IV) access to reduce discomfort and make you sleepy before the procedure.   Your whole body may move when the shock is delivered. Your chest may feel sore.   You may be able to go home after a few hours. Your heart rhythm will be watched to make sure it does not change.  HOME CARE INSTRUCTIONS    Only take medicine as directed by your caregiver. Be sure you understand how and when to take your medicine.   Learn how to feel your pulse and check it often.   Limit your activity for 48 hours.   Avoid caffeine and other stimulants as directed.  SEEK MEDICAL CARE IF:    You feel like your heart is beating too fast or your pulse is not regular.   You have any questions about your medicines.   You have bleeding that will not stop.  SEEK IMMEDIATE MEDICAL CARE IF:    You are dizzy or feel faint.   It   is hard to breathe or you feel short of breath.   There is a change in discomfort in your chest.   Your speech is slurred or you have trouble moving your arm or leg on one side.   You get a muscle cramp.   Your fingers or toes turn cold or blue.  MAKE SURE YOU:    Understand these instructions.   Will watch your condition.   Will get help right away if you are not doing well or get worse.  Document Released: 08/25/2002 Document Revised: 11/27/2011 Document Reviewed: 12/25/2007  Exit[employer] Patient Information 2014 [employer].

## 2013-02-06 NOTE — Preoperative (Signed)
Beta Blockers   Reason not to administer Beta Blockers:Not Applicable 

## 2013-02-06 NOTE — Transfer of Care (Signed)
Immediate Anesthesia Transfer of Care Note  Patient: Dennis Hill  Procedure(s) Performed: Procedure(s): CARDIOVERSION (N/A)  Patient Location: PACU  Anesthesia Type:General  Level of Consciousness: awake, alert , oriented and patient cooperative  Airway & Oxygen Therapy: Patient Spontanous Breathing  Post-op Assessment: Report given to PACU RN, Post -op Vital signs reviewed and stable and Patient moving all extremities  Post vital signs: Reviewed and stable  Complications: No apparent anesthesia complications

## 2013-02-06 NOTE — Anesthesia Postprocedure Evaluation (Signed)
  Anesthesia Post-op Note  Patient: Dennis Hill  Procedure(s) Performed: Procedure(s): CARDIOVERSION (N/A)  Patient Location: PACU and Short Stay  Anesthesia Type:MAC  Level of Consciousness: awake  Airway and Oxygen Therapy: Patient Spontanous Breathing  Post-op Pain: mild  Post-op Assessment: Post-op Vital signs reviewed  Post-op Vital Signs: Reviewed  Complications: No apparent anesthesia complications

## 2013-02-07 ENCOUNTER — Encounter (HOSPITAL_COMMUNITY): Payer: Self-pay | Admitting: Internal Medicine

## 2013-02-11 ENCOUNTER — Other Ambulatory Visit: Payer: Self-pay | Admitting: Internal Medicine

## 2013-02-11 ENCOUNTER — Telehealth: Payer: Self-pay | Admitting: *Deleted

## 2013-02-11 ENCOUNTER — Encounter: Payer: Self-pay | Admitting: *Deleted

## 2013-02-11 DIAGNOSIS — I4892 Unspecified atrial flutter: Secondary | ICD-10-CM

## 2013-02-11 NOTE — Telephone Encounter (Signed)
I spoke with the patient. I have made recommendations for what he can take for his head/chest congestion. I have also spoken with him about his flutter ablation and we will schedule this for 03/06/13. Sherri Rad, RN, BSN  Patient scheduled for 03/06/13 for flutter ablation. Instructions mailed to patient. Address previously confirmed with the patient. He will come for labs/ ekg on 03/04/13. Sherri Rad, RN, BSN

## 2013-02-11 NOTE — Telephone Encounter (Addendum)
Left message for Dennis Hill to repeat echo and have sleep study the week of June 9th.Dennis Hill came in the office to schedule echo. Patient  Has been having a lot of chest congestion and would like for nurse to recommend which type of medication he could take. Patient states his chest congestion did not start until he started taking new medication. He also wants to wait to have sleep study.please please call [phone].

## 2013-02-13 ENCOUNTER — Telehealth: Payer: Self-pay | Admitting: Internal Medicine

## 2013-02-13 DIAGNOSIS — R319 Hematuria, unspecified: Secondary | ICD-10-CM

## 2013-02-13 NOTE — Telephone Encounter (Signed)
Spoke with pt, when he got up this morning he had dark brown urine. He has had it all day and if he drinks a lot of fluid it gets lighter. He denies any symptoms suggesting UTI. He has been on xarelto since 01-28-13. He started lisinopril 02-04-13. He reports having this same problem when he was given carvedilol in the past. Will discuss with the pharm md and call him back.

## 2013-02-13 NOTE — Telephone Encounter (Signed)
New Problem:    Patient called in because his urine is brown and cloudy and would like to consult with you.  Please call back.

## 2013-02-13 NOTE — Telephone Encounter (Signed)
Discussed with pharm md, not sure what could be causing this. Not a usual side effect related to lisinopril. Told pt to hold the lisinopril for now and I will discuss with dr Graciela Husbands tomorrow. Pt voiced understanding to go to the ER if his symptoms change.

## 2013-02-14 NOTE — Telephone Encounter (Signed)
Left message for pt to call, discussed with dr Graciela Husbands, pt needs u/a and bmp today if possible. He will cont off the lisinopril. Once the results are here will be able to make further recommendations on his meds.

## 2013-02-17 NOTE — Telephone Encounter (Signed)
Follow up  Pt is returning your call from Friday .Marland Kitchen

## 2013-02-17 NOTE — Telephone Encounter (Signed)
Spoke with pt, he took no lisinopril Friday, urine was clear until Saturday afternoon and then he had a small amount of brown urine. He restarted the lisinopril and has had no other problems. He will be here wed for echo and we will get u/a and bmp at that time.

## 2013-02-19 ENCOUNTER — Ambulatory Visit (HOSPITAL_COMMUNITY): Payer: Medicare Other | Attending: Internal Medicine

## 2013-02-19 ENCOUNTER — Telehealth: Payer: Self-pay | Admitting: Internal Medicine

## 2013-02-19 ENCOUNTER — Other Ambulatory Visit (INDEPENDENT_AMBULATORY_CARE_PROVIDER_SITE_OTHER): Payer: Medicare Other

## 2013-02-19 DIAGNOSIS — I420 Dilated cardiomyopathy: Secondary | ICD-10-CM

## 2013-02-19 DIAGNOSIS — I4892 Unspecified atrial flutter: Secondary | ICD-10-CM | POA: Insufficient documentation

## 2013-02-19 DIAGNOSIS — I428 Other cardiomyopathies: Secondary | ICD-10-CM | POA: Insufficient documentation

## 2013-02-19 DIAGNOSIS — R319 Hematuria, unspecified: Secondary | ICD-10-CM

## 2013-02-19 LAB — BASIC METABOLIC PANEL
Chloride: 106 mEq/L (ref 96–112)
GFR: 63.2 mL/min (ref >60.00)
Potassium: 4.6 mEq/L (ref 3.5–5.1)
Sodium: 140 mEq/L (ref 135–145)

## 2013-02-19 NOTE — Telephone Encounter (Signed)
Spoke with pt, he had a large amount of blood in his urine today. He has decided he is not going to take the xarelto anymore. He is aware of his risk of stroke with the atrial fib. Will discuss with dr Graciela Husbands.

## 2013-02-19 NOTE — Telephone Encounter (Signed)
New problem    Pt going to stop taking xarelto and pt has a lot of blood in his urine

## 2013-02-19 NOTE — Progress Notes (Signed)
Echocardiogram performed.  

## 2013-02-20 NOTE — Telephone Encounter (Signed)
I spoke with the patient and he is aware of Dr. Odessa Fleming recommendations. I will work on coordinating a TEE to be done on 6/19 prior to his a-flutter ablation.

## 2013-02-20 NOTE — Telephone Encounter (Signed)
Dennis Hill   Dr. Graciela Husbands said the alternative would be to      Do a TEE prior to the procedure.      Have pt start anticoagulant (Eliquis) 48 hours before the procedure & take it for 2 weeks after.      2. Follow-up with a urologist about the bleeding         Dennis Hill

## 2013-02-21 ENCOUNTER — Other Ambulatory Visit (INDEPENDENT_AMBULATORY_CARE_PROVIDER_SITE_OTHER): Payer: Medicare Other

## 2013-02-21 ENCOUNTER — Encounter: Payer: Self-pay | Admitting: Internal Medicine

## 2013-02-21 ENCOUNTER — Ambulatory Visit (INDEPENDENT_AMBULATORY_CARE_PROVIDER_SITE_OTHER): Payer: Medicare Other | Admitting: Internal Medicine

## 2013-02-21 VITALS — BP 114/70 | HR 72 | Temp 96.8°F | Ht 77.0 in | Wt 280.1 lb

## 2013-02-21 DIAGNOSIS — R310 Gross hematuria: Secondary | ICD-10-CM | POA: Insufficient documentation

## 2013-02-21 DIAGNOSIS — J309 Allergic rhinitis, unspecified: Secondary | ICD-10-CM

## 2013-02-21 DIAGNOSIS — E119 Type 2 diabetes mellitus without complications: Secondary | ICD-10-CM

## 2013-02-21 LAB — URINALYSIS, ROUTINE W REFLEX MICROSCOPIC
Specific Gravity, Urine: >=1.03 (ref 1.000–1.030)
Urine Glucose: NEGATIVE
Urobilinogen, UA: 1 (ref 0.0–1.0)

## 2013-02-21 MED ORDER — METHYLPREDNISOLONE ACETATE 80 MG/ML IJ SUSP
80.0000 mg | Freq: Once | INTRAMUSCULAR | Status: AC
  Administered 2013-02-21: 80 mg via INTRAMUSCULAR

## 2013-02-21 MED ORDER — FLUTICASONE PROPIONATE 50 MCG/ACT NA SUSP: 2.0000 | Freq: Every day | NASAL | Status: DC

## 2013-02-21 MED ORDER — FEXOFENADINE HCL 180 MG PO TABS: 180.0000 mg | ORAL_TABLET | Freq: Every day | ORAL | Status: DC

## 2013-02-21 MED ORDER — APIXABAN 5 MG PO TABS: 5.0000 mg | ORAL_TABLET | Freq: Two times a day (BID) | ORAL | Status: DC

## 2013-02-21 NOTE — Patient Instructions (Addendum)
Please take all new medication as prescribed - the eliquis - ONLY 48 hrs before (to start June 15, with the procedure to be done June 17), as well as the allegra and flonase as prescribed OK to stop the xarelto Please continue all other medications as before, and refills have been done if requested. You will be contacted regarding the referral for: urology Please go to the LAB in the Basement (turn left off the elevator) for the tests to be done today - just the urine test today Please keep your appointments with your specialists as you have planned - the procedure June 17

## 2013-02-21 NOTE — Progress Notes (Signed)
Subjective:    Patient ID: Dennis Hill, male    DOB: [birth year], 67 y.o.   MRN: [id number]  HPI  Here with c/w BRB/gross hematuria two signficant episodes in the past 3 days, but Denies urinary symptoms such as dysuria, frequency, urgency, flank pain, or n/v, fever, chills. Occurred while on Xarelto, last dose Monday June 2, but had at least 1 episode 2 days later off med, and he is very concerned about the possibility of further blood loss and anemia that might need further treatment.  Simply refuses to take further xarelto.  Had contacted Dr Graciela Husbands office June 4, advised to consider other option of doing TEE pre-procedure June 19 with start eliquis 48 hrs prior.  Has not seen urology prior, no hx of renal stone, GU infection, trauma, or other bleeding diathesis.  No other overt bleeding or bruising.  Incidentally - Does have several wks ongoing nasal allergy symptoms with clearish congestion, itch and sneezing, without fever, pain, ST, cough, swelling or wheezing.  Pt denies polydipsia, polyuria,    Past Medical History  Diagnosis Date  . DIABETES MELLITUS, TYPE II 01/28/2010    diet controlled  . DEPRESSION 03/15/2009  . GERD 03/15/2009  . DIVERTICULOSIS, COLON 03/15/2009  . 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 FOOT PAIN, LEFT 04/05/2010  . RUQ PAIN 01/28/2010  . Hx of adenomatous colonic polyps 03/15/2009  . NEPHROLITHIASIS, HX OF 01/28/2010  . Atrial bigeminy 2008    Dr. Graciela Husbands  . Anxiety   . Chronic insomnia   . Fatty liver   . Internal hemorrhoids   . Hyperlipidemia 02/10/2011  . Atrial fibrillation    Past Surgical History  Procedure Laterality Date  . Appendectomy    . Cholecystectomy  2006  . Tonsillectomy    . Inguinal herniorrhapy    . Hernia repair  1986    right inguinal  . Cardioversion N/A 02/06/2013    Procedure: CARDIOVERSION;  Surgeon: Marinus Maw, MD;  Location: [facility] ENDOSCOPY;  Service: Cardiovascular;  Laterality: N/A;    reports that he quit smoking about 33 years ago. His smoking use included Cigarettes. He has a 6 pack-year smoking history. He has never used smokeless tobacco. He reports that he drinks about 1.2 ounces of alcohol per week. He reports that he does not use illicit drugs. family history includes Colon cancer in his mother; Heart attack in his brother; and Lung cancer (age of onset: 68) in his mother.  There is no history of CAD. No Known Allergies Current Outpatient Prescriptions on File Prior to Visit  Medication Sig Dispense Refill  . diltiazem (CARDIZEM CD) 120 MG 24 hr capsule Take 1 capsule (120 mg total) by mouth daily.  30 capsule  6   No current facility-administered medications on file prior to visit.   Review of Systems  Constitutional: Negative for unexpected weight change, or unusual diaphoresis  HENT: Negative for tinnitus.   Eyes: Negative for photophobia and visual disturbance.  Respiratory: Negative for choking and stridor.   Gastrointestinal: Negative for vomiting and blood in stool.  Genitourinary: Negative for hematuria and decreased urine volume.  Musculoskeletal: Negative for acute joint swelling Skin: Negative for color change and wound.  Neurological: Negative for tremors and numbness other than noted  Psychiatric/Behavioral: Negative for decreased concentration or  hyperactivity.       Objective:   Physical Exam BP 114/70  Pulse 72  Temp(Src) 96.8 F (36 C) (Oral)  Ht 6\' 5"  (1.956  m)  Wt 280 lb 2 oz (127.064 kg)  BMI 33.21 kg/m2  SpO2 94% VS noted,  Constitutional: Pt appears well-developed and well-nourished.  HENT: Head: NCAT.  Right Ear: External ear normal.  Left Ear: External ear normal.  Bilat tm's with mild erythema.  Max sinus areas non tender.  Pharynx with mild erythema, no exudate Eyes: Conjunctivae and EOM are normal. Pupils are equal, round, and reactive to light.  Neck: Normal range of motion. Neck supple.  Cardiovascular: Normal rate and  regular rhythm.   Pulmonary/Chest: Effort normal and breath sounds normal.  Abd:  Soft, NT, non-distended, + BS, no flank tender Neurological: Pt is alert. Not confused  Skin: Skin is warm. No erythema.  Psychiatric: Pt behavior is normal. Thought content normal. 1+ nervous    Assessment & Plan:

## 2013-02-22 ENCOUNTER — Emergency Department (HOSPITAL_COMMUNITY)
Admission: EM | Admit: 2013-02-22 | Discharge: 2013-02-23 | Disposition: A | Discharge status: Home / Self Care | Payer: Medicare Other | Attending: Emergency Medicine | Admitting: Emergency Medicine

## 2013-02-22 ENCOUNTER — Other Ambulatory Visit: Payer: Self-pay

## 2013-02-22 ENCOUNTER — Encounter (HOSPITAL_COMMUNITY): Payer: Self-pay | Admitting: Adult Health

## 2013-02-22 DIAGNOSIS — Z8659 Personal history of other mental and behavioral disorders: Secondary | ICD-10-CM | POA: Insufficient documentation

## 2013-02-22 DIAGNOSIS — Z8719 Personal history of other diseases of the digestive system: Secondary | ICD-10-CM | POA: Insufficient documentation

## 2013-02-22 DIAGNOSIS — Z7902 Long term (current) use of antithrombotics/antiplatelets: Secondary | ICD-10-CM | POA: Insufficient documentation

## 2013-02-22 DIAGNOSIS — Z860100 Personal history of colon polyps, unspecified: Secondary | ICD-10-CM | POA: Insufficient documentation

## 2013-02-22 DIAGNOSIS — I10 Essential (primary) hypertension: Secondary | ICD-10-CM | POA: Insufficient documentation

## 2013-02-22 DIAGNOSIS — I498 Other specified cardiac arrhythmias: Secondary | ICD-10-CM | POA: Insufficient documentation

## 2013-02-22 DIAGNOSIS — Z8739 Personal history of other diseases of the musculoskeletal system and connective tissue: Secondary | ICD-10-CM | POA: Insufficient documentation

## 2013-02-22 DIAGNOSIS — R319 Hematuria, unspecified: Secondary | ICD-10-CM | POA: Insufficient documentation

## 2013-02-22 DIAGNOSIS — Z87891 Personal history of nicotine dependence: Secondary | ICD-10-CM | POA: Insufficient documentation

## 2013-02-22 DIAGNOSIS — Z87442 Personal history of urinary calculi: Secondary | ICD-10-CM | POA: Insufficient documentation

## 2013-02-22 DIAGNOSIS — Z862 Personal history of diseases of the blood and blood-forming organs and certain disorders involving the immune mechanism: Secondary | ICD-10-CM | POA: Insufficient documentation

## 2013-02-22 DIAGNOSIS — N200 Calculus of kidney: Secondary | ICD-10-CM | POA: Insufficient documentation

## 2013-02-22 DIAGNOSIS — Z87448 Personal history of other diseases of urinary system: Secondary | ICD-10-CM | POA: Insufficient documentation

## 2013-02-22 DIAGNOSIS — Z8639 Personal history of other endocrine, nutritional and metabolic disease: Secondary | ICD-10-CM | POA: Insufficient documentation

## 2013-02-22 DIAGNOSIS — K219 Gastro-esophageal reflux disease without esophagitis: Secondary | ICD-10-CM | POA: Insufficient documentation

## 2013-02-22 DIAGNOSIS — E119 Type 2 diabetes mellitus without complications: Secondary | ICD-10-CM | POA: Insufficient documentation

## 2013-02-22 DIAGNOSIS — Z8669 Personal history of other diseases of the nervous system and sense organs: Secondary | ICD-10-CM | POA: Insufficient documentation

## 2013-02-22 DIAGNOSIS — Z8679 Personal history of other diseases of the circulatory system: Secondary | ICD-10-CM | POA: Insufficient documentation

## 2013-02-22 DIAGNOSIS — R110 Nausea: Secondary | ICD-10-CM | POA: Insufficient documentation

## 2013-02-22 DIAGNOSIS — I4891 Unspecified atrial fibrillation: Secondary | ICD-10-CM | POA: Insufficient documentation

## 2013-02-22 HISTORY — DX: Unspecified atrial fibrillation: I48.91

## 2013-02-22 LAB — CBC WITH DIFFERENTIAL/PLATELET
Eosinophils Absolute: 0.4 10*3/uL (ref 0.0–0.7)
Hemoglobin: 14.9 g/dL (ref 13.0–17.0)
Lymphocytes Relative: 27 % (ref 12–46)
Lymphs Abs: 3 10*3/uL (ref 0.7–4.0)
Neutro Abs: 6.8 10*3/uL (ref 1.7–7.7)
Neutrophils Relative %: 61 % (ref 43–77)
Platelets: 214 10*3/uL (ref 150–400)
RBC: 4.93 MIL/uL (ref 4.22–5.81)
WBC: 11 10*3/uL — ABNORMAL HIGH (ref 4.0–10.5)

## 2013-02-22 LAB — URINALYSIS, ROUTINE W REFLEX MICROSCOPIC
Bilirubin Urine: NEGATIVE
Ketones, ur: NEGATIVE mg/dL
Nitrite: NEGATIVE
pH: 5.5 (ref 5.0–8.0)

## 2013-02-22 LAB — POCT I-STAT TROPONIN I

## 2013-02-22 LAB — COMPREHENSIVE METABOLIC PANEL
ALT: 24 U/L (ref 0–53)
Alkaline Phosphatase: 91 U/L (ref 39–117)
CO2: 27 mEq/L (ref 19–32)
Chloride: 105 mEq/L (ref 96–112)
GFR calc Af Amer: 56 mL/min — ABNORMAL LOW (ref >90)
Glucose, Bld: 133 mg/dL — ABNORMAL HIGH (ref 70–99)
Potassium: 4.3 mEq/L (ref 3.5–5.1)
Sodium: 139 mEq/L (ref 135–145)
Total Bilirubin: 0.4 mg/dL (ref 0.3–1.2)
Total Protein: 6.9 g/dL (ref 6.0–8.3)

## 2013-02-22 LAB — URINE MICROSCOPIC-ADD ON

## 2013-02-22 MED ORDER — SODIUM CHLORIDE 0.9 % IV BOLUS (SEPSIS): 1000.0000 mL | Freq: Once | INTRAVENOUS | Status: DC

## 2013-02-22 MED ORDER — KETOROLAC TROMETHAMINE 30 MG/ML IJ SOLN: 30.0000 mg | Freq: Once | INTRAMUSCULAR | Status: DC

## 2013-02-22 MED ORDER — OXYCODONE-ACETAMINOPHEN 5-325 MG PO TABS
2.0000 | ORAL_TABLET | Freq: Once | ORAL | Status: AC
  Administered 2013-02-23: 2 via ORAL
  Filled 2013-02-22: qty 2

## 2013-02-22 MED ORDER — MORPHINE SULFATE 4 MG/ML IJ SOLN: 4.0000 mg | Freq: Once | INTRAMUSCULAR | Status: DC

## 2013-02-22 NOTE — ED Notes (Signed)
Presents with sudden onset of R side flank pain that began at 9 pm this evening, pain radiates from right flank to right lower abdomen into right testicle. Pt has HX of atrial fib and was cardioverted last week,  He has been on Xarleto but quit taking blood thinner on Monday due to blood in urine two days prior. Pt reports thick gray/red/brown colored urine but it has cleared since Thursday eveining. HR is 120s, reports feeling increased SOB. Pain in right flank described as sharp and "brought me to my knees, writhing on the floor" pain has subsided to dull ache at this time.

## 2013-02-23 MED ORDER — TAMSULOSIN HCL 0.4 MG PO CAPS: 0.4000 mg | ORAL_CAPSULE | Freq: Every day | ORAL | Status: DC

## 2013-02-23 MED ORDER — OXYCODONE-ACETAMINOPHEN 5-325 MG PO TABS: 1.0000 | ORAL_TABLET | ORAL | Status: DC | PRN

## 2013-02-23 NOTE — ED Provider Notes (Signed)
Medical screening examination/treatment/procedure(s) were performed by non-physician practitioner and as supervising physician I was immediately available for consultation/collaboration.   Hanley Seamen, MD 02/23/13 [phone]

## 2013-02-23 NOTE — Assessment & Plan Note (Signed)
D/w pt, and June 4 telephone advice verified with Dr Klein/no change; for now to cont off xarelto, refer urology, check urine cx, f/u any further symptoms such as pain, fever, and will need to communicate to Dr Graciela Husbands this plan

## 2013-02-23 NOTE — Assessment & Plan Note (Signed)
stable overall by history and exam, recent data reviewed with pt, and pt to continue medical treatment as before,  to f/u any worsening symptoms or concerns Lab Results  Component Value Date   HGBA1C 6.7* 12/10/2012    

## 2013-02-23 NOTE — ED Provider Notes (Signed)
History     CSN: [id number]  Arrival date & time 02/22/13  2156   First MD Initiated Contact with Patient 02/22/13 2346      Chief Complaint  Patient presents with  . Flank Pain   HPI  History provided by the patient. Patient is a 67 year old male with history of hypertension, hyperlipidemia, diabetes, A. fib who presents with complaints of acute onset right flank pain. Patient states he had severe sharp stabbing pain to his right flank around 9 PM. Pain radiated across the lower abdomen and into his genital area. Symptoms were persistent and associated with slight nausea. He did however improve while driving on the way to the emergency room. Currently he reports his pain is down from 10/10 down to 3/10. Patient denies having any similar symptoms previously. Has no prior pain from a kidney stone but does report being told he had stones in his kidneys from a past ultrasound study. He does also mention having some hematuria recently which he is being sent to urologist for. He did not notice any blood in his urine today. No fever, chills or sweats. No chest pain or shortness of breath.      Past Medical History  Diagnosis Date  . DIABETES MELLITUS, TYPE II 01/28/2010    diet controlled  . DEPRESSION 03/15/2009  . GERD 03/15/2009  . DIVERTICULOSIS, COLON 03/15/2009  . 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 FOOT PAIN, LEFT 04/05/2010  . RUQ PAIN 01/28/2010  . Hx of adenomatous colonic polyps 03/15/2009  . NEPHROLITHIASIS, HX OF 01/28/2010  . Atrial bigeminy 2008    Dr. Graciela Husbands  . Anxiety   . Chronic insomnia   . Fatty liver   . Internal hemorrhoids   . Hyperlipidemia 02/10/2011  . Atrial fibrillation     Past Surgical History  Procedure Laterality Date  . Appendectomy    . Cholecystectomy  2006  . Tonsillectomy    . Inguinal herniorrhapy    . Hernia repair  1986    right inguinal  . Cardioversion N/A 02/06/2013    Procedure: CARDIOVERSION;  Surgeon:  Marinus Maw, MD;  Location: [facility] ENDOSCOPY;  Service: Cardiovascular;  Laterality: N/A;    Family History  Problem Relation Age of Onset  . Lung cancer Mother 11  . Colon cancer Mother   . CAD Neg Hx   . Heart attack Brother     History  Substance Use Topics  . Smoking status: Former Smoker -- 1.00 packs/day for 6 years    Types: Cigarettes    Quit date: 12/25/1979  . Smokeless tobacco: Never Used  . Alcohol Use: 1.2 oz/week    1 Glasses of wine, 1 Cans of beer per week     Comment: per week      Review of Systems  Constitutional: Negative for fever, chills and diaphoresis.  Gastrointestinal: Positive for nausea and abdominal pain. Negative for vomiting.  Genitourinary: Positive for hematuria and flank pain. Negative for dysuria.  All other systems reviewed and are negative.    Allergies  Review of patient's allergies indicates no known allergies.  Home Medications   Current Outpatient Rx  Name  Route  Sig  Dispense  Refill  . apixaban (ELIQUIS) 5 MG TABS tablet   Oral   Take 1 tablet (5 mg total) by mouth 2 (two) times daily.   60 tablet   0   . diltiazem (CARDIZEM CD) 120 MG 24 hr capsule   Oral  Take 1 capsule (120 mg total) by mouth daily.   30 capsule   6   . esomeprazole (NEXIUM) 40 MG capsule   Oral   Take 1 capsule (40 mg total) by mouth daily before breakfast.   90 capsule   1   . fexofenadine (ALLEGRA) 180 MG tablet   Oral   Take 1 tablet (180 mg total) by mouth daily.   30 tablet   2   . fluticasone (FLONASE) 50 MCG/ACT nasal spray   Nasal   Place 2 sprays into the nose daily.   16 g   2   . lisinopril (PRINIVIL,ZESTRIL) 5 MG tablet      Take one tablet by mouth once daily   30 tablet   3     BP 146/83  Pulse 120  Temp(Src) 98.3 F (36.8 C) (Oral)  Resp 16  SpO2 95%  Physical Exam  Nursing note and vitals reviewed. Constitutional: He is oriented to person, place, and time. He appears well-developed and well-nourished.   HENT:  Head: Normocephalic.  Cardiovascular: Normal rate.  An irregular rhythm present.  Pulmonary/Chest: Effort normal and breath sounds normal. No respiratory distress. He has no wheezes. He has no rales.  Abdominal: Soft. There is no tenderness. There is no rebound and no guarding.  Right CVA tenderness  Musculoskeletal: Normal range of motion.  Neurological: He is alert and oriented to person, place, and time.  Skin: Skin is warm.  Psychiatric: He has a normal mood and affect. His behavior is normal.    ED Course  Procedures   Results for orders placed during the hospital encounter of 02/22/13  CBC WITH DIFFERENTIAL      Result Value Range   WBC 11.0 (*) 4.0 - 10.5 K/uL   RBC 4.93  4.22 - 5.81 MIL/uL   Hemoglobin 14.9  13.0 - 17.0 g/dL   HCT 16.1  09.6 - 04.5 %   MCV 88.4  78.0 - 100.0 fL   MCH 30.2  26.0 - 34.0 pg   MCHC 34.2  30.0 - 36.0 g/dL   RDW 40.9  81.1 - 91.4 %   Platelets 214  150 - 400 K/uL   Neutrophils Relative % 61  43 - 77 %   Neutro Abs 6.8  1.7 - 7.7 K/uL   Lymphocytes Relative 27  12 - 46 %   Lymphs Abs 3.0  0.7 - 4.0 K/uL   Monocytes Relative 8  3 - 12 %   Monocytes Absolute 0.9  0.1 - 1.0 K/uL   Eosinophils Relative 3  0 - 5 %   Eosinophils Absolute 0.4  0.0 - 0.7 K/uL   Basophils Relative 1  0 - 1 %   Basophils Absolute 0.1  0.0 - 0.1 K/uL  COMPREHENSIVE METABOLIC PANEL      Result Value Range   Sodium 139  135 - 145 mEq/L   Potassium 4.3  3.5 - 5.1 mEq/L   Chloride 105  96 - 112 mEq/L   CO2 27  19 - 32 mEq/L   Glucose, Bld 133 (*) 70 - 99 mg/dL   BUN 26 (*) 6 - 23 mg/dL   Creatinine, Ser 7.82 (*) 0.50 - 1.35 mg/dL   Calcium 95.6  8.4 - 21.3 mg/dL   Total Protein 6.9  6.0 - 8.3 g/dL   Albumin 4.2  3.5 - 5.2 g/dL   AST 22  0 - 37 U/L   ALT 24  0 - 53 U/L  Alkaline Phosphatase 91  39 - 117 U/L   Total Bilirubin 0.4  0.3 - 1.2 mg/dL   GFR calc non Af Amer 48 (*) >90 mL/min   GFR calc Af Amer 56 (*) >90 mL/min  URINALYSIS, ROUTINE W  REFLEX MICROSCOPIC      Result Value Range   Color, Urine AMBER (*) YELLOW   APPearance CLOUDY (*) CLEAR   Specific Gravity, Urine 1.023  1.005 - 1.030   pH 5.5  5.0 - 8.0   Glucose, UA NEGATIVE  NEGATIVE mg/dL   Hgb urine dipstick LARGE (*) NEGATIVE   Bilirubin Urine NEGATIVE  NEGATIVE   Ketones, ur NEGATIVE  NEGATIVE mg/dL   Protein, ur 30 (*) NEGATIVE mg/dL   Urobilinogen, UA 1.0  0.0 - 1.0 mg/dL   Nitrite NEGATIVE  NEGATIVE   Leukocytes, UA SMALL (*) NEGATIVE  URINE MICROSCOPIC-ADD ON      Result Value Range   WBC, UA 3-6  <3 WBC/hpf   RBC / HPF TOO NUMEROUS TO COUNT  <3 RBC/hpf   Bacteria, UA RARE  RARE  POCT I-STAT TROPONIN I      Result Value Range   Troponin i, poc 0.00  0.00 - 0.08 ng/mL   Comment 3                 1. Kidney stone   2. Hematuria       MDM  11:45PM patient seen and evaluated. Patient currently reporting improvement of symptoms. Pain was 10 out of 10 now 3/10. Symptoms consistent and suggestive of a right-sided kidney stone. I discussed options for imaging including CAT scan or simple KUB. Patient does not wish to have any imaging performed this time and prefers to monitor his symptoms. We'll plan to give prescription for pain medications and Flomax. Patient will followup with urology.        Angus Seller, PA-C 02/23/13 0600

## 2013-02-23 NOTE — Assessment & Plan Note (Signed)
Mild to mod, for allegra/flonase  to f/u any worsening symptoms or concerns

## 2013-02-23 NOTE — ED Notes (Signed)
Pt dc to home. Pt sts understanding to dc instructions. Pt denies need for w/c. Ambulatory to exit without difficulty.

## 2013-02-27 ENCOUNTER — Telehealth: Payer: Self-pay | Admitting: *Deleted

## 2013-02-27 DIAGNOSIS — I4892 Unspecified atrial flutter: Secondary | ICD-10-CM

## 2013-02-27 MED ORDER — APIXABAN 5 MG PO TABS: 5.0000 mg | ORAL_TABLET | Freq: Two times a day (BID) | ORAL | Status: DC

## 2013-02-27 NOTE — Telephone Encounter (Signed)
I spoke with the patient today regarding scheduling his TEE prior to a-flutter ablation on 6/19. The goal was to schedule both for the same day on 6/19, however due to physician availability and open times on that day, I have been unable to do arrange this for the patient. Per Dr. Graciela Husbands, start eliquis 5 mg BID on 6/16, TEE- on 6/18, & continue with a-flutter ablation on 6/19. I have explained this to the patient. He is agreeable. I started calling endo at 4:30 pm today to schedule the TEE and was unable to reach the patient after multiple calls until 5:00 pm. I will ask that triage call endo tomorrow to arrange a TEE for 6/18 with Dr. Shirlee Latch and call the patient with his instructions. I will pull Eliquis samples for him along with a RX and free 30 day trial card to start. His a- flutter ablation has already been arranged.

## 2013-02-28 ENCOUNTER — Encounter: Payer: Self-pay | Admitting: *Deleted

## 2013-02-28 NOTE — Telephone Encounter (Signed)
Pt scheduled for 10 am 6/18 with Dr Shirlee Latch.  Pt to arrive at 9 am.  He is aware   He is also aware of his appt on Tuesday for EKG and lab work

## 2013-03-03 NOTE — Telephone Encounter (Signed)
TEE scheduled for 6/18. See 6/12 phone note.

## 2013-03-04 ENCOUNTER — Ambulatory Visit (INDEPENDENT_AMBULATORY_CARE_PROVIDER_SITE_OTHER): Payer: Medicare Other | Admitting: *Deleted

## 2013-03-04 ENCOUNTER — Other Ambulatory Visit (INDEPENDENT_AMBULATORY_CARE_PROVIDER_SITE_OTHER): Payer: Medicare Other

## 2013-03-04 VITALS — HR 82

## 2013-03-04 DIAGNOSIS — I4892 Unspecified atrial flutter: Secondary | ICD-10-CM

## 2013-03-04 LAB — CBC WITH DIFFERENTIAL/PLATELET
Basophils Absolute: 0.1 10*3/uL (ref 0.0–0.1)
Basophils Relative: 1 % (ref 0.0–3.0)
Eosinophils Absolute: 0.3 10*3/uL (ref 0.0–0.7)
Lymphocytes Relative: 30 % (ref 12.0–46.0)
MCHC: 33.2 g/dL (ref 30.0–36.0)
MCV: 89.2 fl (ref 78.0–100.0)
Monocytes Absolute: 0.7 10*3/uL (ref 0.1–1.0)
Neutrophils Relative %: 56.2 % (ref 43.0–77.0)
Platelets: 219 10*3/uL (ref 150.0–400.0)
RBC: 5 Mil/uL (ref 4.22–5.81)
RDW: 14 % (ref 11.5–14.6)

## 2013-03-04 LAB — PROTIME-INR
INR: 1.1 ratio — ABNORMAL HIGH (ref 0.8–1.0)
Prothrombin Time: 11.3 s (ref 10.2–12.4)

## 2013-03-04 MED ORDER — SODIUM CHLORIDE 0.9 % IV SOLN: INTRAVENOUS | Status: DC

## 2013-03-04 NOTE — Progress Notes (Signed)
Pt here for EKG for pending Atrial Flutter ablation.

## 2013-03-05 ENCOUNTER — Ambulatory Visit (HOSPITAL_COMMUNITY)
Admission: RE | Admit: 2013-03-05 | Discharge: 2013-03-05 | Disposition: A | Discharge status: Home / Self Care | Payer: Medicare Other | Source: Ambulatory Visit | Attending: Cardiology | Admitting: Cardiology

## 2013-03-05 ENCOUNTER — Encounter (HOSPITAL_COMMUNITY)
Admission: RE | Disposition: A | Discharge status: Home / Self Care | Payer: Self-pay | Source: Ambulatory Visit | Attending: Cardiology

## 2013-03-05 ENCOUNTER — Encounter (HOSPITAL_COMMUNITY): Payer: Self-pay | Admitting: *Deleted

## 2013-03-05 DIAGNOSIS — Z8249 Family history of ischemic heart disease and other diseases of the circulatory system: Secondary | ICD-10-CM | POA: Insufficient documentation

## 2013-03-05 DIAGNOSIS — N529 Male erectile dysfunction, unspecified: Secondary | ICD-10-CM | POA: Insufficient documentation

## 2013-03-05 DIAGNOSIS — F411 Generalized anxiety disorder: Secondary | ICD-10-CM | POA: Insufficient documentation

## 2013-03-05 DIAGNOSIS — K219 Gastro-esophageal reflux disease without esophagitis: Secondary | ICD-10-CM | POA: Insufficient documentation

## 2013-03-05 DIAGNOSIS — I4892 Unspecified atrial flutter: Secondary | ICD-10-CM

## 2013-03-05 DIAGNOSIS — I517 Cardiomegaly: Secondary | ICD-10-CM | POA: Insufficient documentation

## 2013-03-05 DIAGNOSIS — Z87891 Personal history of nicotine dependence: Secondary | ICD-10-CM | POA: Insufficient documentation

## 2013-03-05 DIAGNOSIS — K7689 Other specified diseases of liver: Secondary | ICD-10-CM | POA: Insufficient documentation

## 2013-03-05 DIAGNOSIS — G4700 Insomnia, unspecified: Secondary | ICD-10-CM | POA: Insufficient documentation

## 2013-03-05 DIAGNOSIS — F329 Major depressive disorder, single episode, unspecified: Secondary | ICD-10-CM | POA: Insufficient documentation

## 2013-03-05 DIAGNOSIS — F3289 Other specified depressive episodes: Secondary | ICD-10-CM | POA: Insufficient documentation

## 2013-03-05 DIAGNOSIS — E119 Type 2 diabetes mellitus without complications: Secondary | ICD-10-CM | POA: Insufficient documentation

## 2013-03-05 DIAGNOSIS — K5730 Diverticulosis of large intestine without perforation or abscess without bleeding: Secondary | ICD-10-CM | POA: Insufficient documentation

## 2013-03-05 DIAGNOSIS — E785 Hyperlipidemia, unspecified: Secondary | ICD-10-CM | POA: Insufficient documentation

## 2013-03-05 DIAGNOSIS — Z7901 Long term (current) use of anticoagulants: Secondary | ICD-10-CM | POA: Insufficient documentation

## 2013-03-05 DIAGNOSIS — Z79899 Other long term (current) drug therapy: Secondary | ICD-10-CM | POA: Insufficient documentation

## 2013-03-05 HISTORY — PX: TEE WITHOUT CARDIOVERSION: SHX5443

## 2013-03-05 LAB — BASIC METABOLIC PANEL
BUN: 18 mg/dL (ref 6–23)
CO2: 31 mEq/L (ref 19–32)
Calcium: 10.2 mg/dL (ref 8.4–10.5)
Chloride: 109 mEq/L (ref 96–112)
Creatinine, Ser: 1.2 mg/dL (ref 0.4–1.5)

## 2013-03-05 SURGERY — ECHOCARDIOGRAM, TRANSESOPHAGEAL
Anesthesia: Moderate Sedation

## 2013-03-05 MED ORDER — FENTANYL CITRATE 0.05 MG/ML IJ SOLN
INTRAMUSCULAR | Status: DC | PRN
  Administered 2013-03-05: 50 ug via INTRAVENOUS

## 2013-03-05 MED ORDER — MIDAZOLAM HCL 10 MG/2ML IJ SOLN
INTRAMUSCULAR | Status: DC | PRN
  Administered 2013-03-05: 3 mg via INTRAVENOUS
  Administered 2013-03-05: 1 mg via INTRAVENOUS

## 2013-03-05 MED ORDER — MIDAZOLAM HCL 5 MG/ML IJ SOLN
INTRAMUSCULAR | Status: AC
  Filled 2013-03-05: qty 2

## 2013-03-05 MED ORDER — FENTANYL CITRATE 0.05 MG/ML IJ SOLN
INTRAMUSCULAR | Status: AC
  Filled 2013-03-05: qty 2

## 2013-03-05 MED ORDER — SODIUM CHLORIDE 0.9 % IV SOLN
INTRAVENOUS | Status: DC
  Administered 2013-03-05: 10:00:00 via INTRAVENOUS

## 2013-03-05 MED ORDER — BUTAMBEN-TETRACAINE-BENZOCAINE 2-2-14 % EX AERO
INHALATION_SPRAY | CUTANEOUS | Status: DC | PRN
  Administered 2013-03-05: 2 via TOPICAL

## 2013-03-05 NOTE — Interval H&P Note (Signed)
History and Physical Interval Note:  03/05/2013 10:40 AM  Dennis Hill  has presented today for surgery, with the diagnosis of a-fib  The various methods of treatment have been discussed with the patient and family. After consideration of risks, benefits and other options for treatment, the patient has consented to  Procedure(s) with comments: TRANSESOPHAGEAL ECHOCARDIOGRAM (TEE) (N/A) - pam /ja as a surgical intervention .  The patient's history has been reviewed, patient examined, no change in status, stable for surgery.  I have reviewed the patient's chart and labs.  Questions were answered to the patient's satisfaction.     [name] [employer]

## 2013-03-05 NOTE — Progress Notes (Signed)
*  PRELIMINARY RESULTS* Echocardiogram Echocardiogram Transesophageal has been performed.  Dennis Hill 03/05/2013, 11:17 AM

## 2013-03-05 NOTE — H&P (View-Only) (Signed)
 ELECTROPHYSIOLOGY CONSULT NOTE  Patient ID: Dennis Hill, MRN: [id number], DOB/AGE: 05/29/1946 67 y.o. Admit date: (Not on file) Date of Consult: 02/04/2013  Primary Physician: James John, MD Primary Cardiologist: CM Chief Complaint:  fibrillation   HPI Dennis Hill is a 67 y.o. male whom I saw many years ago for what sounds like atrial ectopy who was recently discovered to be"atrial fibrillation".  This was noted on a routine office visit. He complained of having a few episodes of dyspnea on exertion. He had no chest discomfort.. He was treated at that time with aspirin and carvedilol the latter of which was discontinued because of fatigue and Cardizem was initiated. Since that time he has felt much better.    He has thromboembolic risk factors notable for diabetes and age for a CHADS2 score of around and a CHADS-VASc score of 2   Rivaroxaban was initiated. Echocardiogram had demonstrated ejection fraction 35-40% with mild-moderate left atrial enlargement    Past Medical History  Diagnosis Date  . DIABETES MELLITUS, TYPE II 01/28/2010    diet controlled  . DEPRESSION 03/15/2009  . GERD 03/15/2009  . DIVERTICULOSIS, COLON 03/15/2009  . 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 FOOT PAIN, LEFT 04/05/2010  . RUQ PAIN 01/28/2010  . Hx of adenomatous colonic polyps 03/15/2009  . NEPHROLITHIASIS, HX OF 01/28/2010  . Atrial bigeminy 2008    Dr. Georgean Spainhower  . Anxiety   . Chronic insomnia   . Fatty liver   . Internal hemorrhoids   . Hyperlipidemia 02/10/2011      Surgical History:  Past Surgical History  Procedure Laterality Date  . Appendectomy    . Cholecystectomy  2006  . Tonsillectomy    . Inguinal herniorrhapy    . Hernia repair  1986    right inguinal     Home Meds: Prior to Admission medications   Medication Sig Start Date End Date Taking? Authorizing Provider  diltiazem (CARDIZEM CD) 120 MG 24 hr capsule Take 1 capsule (120 mg  total) by mouth daily. 12/24/12 12/24/13 Yes Christopher D McAlhany, MD  esomeprazole (NEXIUM) 40 MG capsule Take 1 capsule (40 mg total) by mouth daily before breakfast. 12/30/12  Yes James W John, MD  Rivaroxaban (XARELTO) 20 MG TABS Take 1 tablet (20 mg total) by mouth daily. 01/28/13  Yes Christopher D McAlhany, MD     Allergies: No Known Allergies  History   Social History  . Marital Status: Single    Spouse Name: N/A    Number of Children: 2  . Years of Education: N/A   Occupational History  . 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 Smoking status: Former Smoker -- 1.00 packs/day for 6 years    Types: Cigarettes    Quit date: 12/25/1979  . Smokeless tobacco: Never Used  . Alcohol Use: 2.4 oz/week    4 Glasses of wine per week     Comment: social  . Drug Use: No  . Sexually Active: Not on file   Other Topics Concern  . Not on file   Social History Narrative   Step daughter and 2 8 yo twins live with      Family History  Problem Relation Age of Onset  . Lung cancer Mother 70  . Colon cancer Mother   . CAD Neg Hx   . Heart attack Brother      ROS:  Please see the history of present   illness.     All other systems reviewed and negative.    Physical Exam:   Blood pressure 126/80, pulse 71, height 6' 5" (1.956 m), weight 283 lb (128.368 kg). General: Well developed, well nourished male in no acute distress. Head: Normocephalic, atraumatic, sclera non-icteric, no xanthomas, nares are without discharge. EENT: normal Lymph Nodes:  none Back: without scoliosis/kyphosis , no CVA tendersness Neck: Negative for carotid bruits. JVD 9-10  Lungs: Clear bilaterally to auscultation without wheezes, rales, or rhonchi. Breathing is unlabored. Heart: RRR with S1 S2. No murmur , rubs, or gallops appreciated. Abdomen: Soft, non-tender, non-distended with normoactive bowel sounds. No hepatomegaly. No rebound/guarding. No obvious abdominal masses. Msk:   Strength and tone appear normal for age. Extremities: No clubbing or cyanosis. 2+  edema.  Distal pedal pulses are 2+ and equal bilaterally. Skin: Warm and Dry Neuro: Alert and oriented X 3. CN III-XII intact Grossly normal sensory and motor function . Psych:  Responds to questions appropriately with a normal affect.      Labs: Cardiac Enzymes No results found for this basename: CKTOTAL, CKMB, TROPONINI,  in the last 72 hours CBC Lab Results  Component Value Date   WBC 7.2 12/09/2012   HGB 14.9 12/09/2012   HCT 42.9 12/09/2012   MCV 87.1 12/09/2012   PLT 209.0 12/09/2012   PROTIME: No results found for this basename: LABPROT, INR,  in the last 72 hours Chemistry No results found for this basename: NA, K, CL, CO2, BUN, CREATININE, CALCIUM, LABALBU, PROT, BILITOT, ALKPHOS, ALT, AST, GLUCOSE,  in the last 168 hours Lipids Lab Results  Component Value Date   CHOL 145 12/09/2012   HDL 29.30* 12/09/2012   LDLCALC 101* 12/09/2012   TRIG 76.0 12/09/2012   BNP No results found for this basename: probnp   Miscellaneous No results found for this basename: DDIMER    Radiology/Studies:  No results found.  EKG:  afklutter with variable  conduction  Assessment and Plan:  Is a  Zyann Mabry   

## 2013-03-05 NOTE — CV Procedure (Addendum)
Procedure: TEE  Indication: pre-atrial flutter ablation  Sedation: Versed 4 mg IV, Fentanyl 50 mcg IV  Findings: See echo section of chart for full report.  Normal LV size with mild to moderate global hypokinesis, EF 40-45%.  Trivial MR.  No LAA thrombus.  PFO noted.    Ok for atrial flutter ablation.   Dennis Hill 03/05/2013 10:57 AM

## 2013-03-06 ENCOUNTER — Ambulatory Visit (HOSPITAL_COMMUNITY): Payer: Medicare Other | Admitting: Anesthesiology

## 2013-03-06 ENCOUNTER — Ambulatory Visit (HOSPITAL_BASED_OUTPATIENT_CLINIC_OR_DEPARTMENT_OTHER)
Admission: RE | Admit: 2013-03-06 | Discharge: 2013-03-07 | Disposition: A | Discharge status: Home / Self Care | Payer: Medicare Other | Source: Ambulatory Visit | Attending: Internal Medicine | Admitting: Internal Medicine

## 2013-03-06 ENCOUNTER — Encounter (HOSPITAL_COMMUNITY): Payer: Self-pay | Admitting: Anesthesiology

## 2013-03-06 ENCOUNTER — Encounter (HOSPITAL_COMMUNITY)
Admission: RE | Disposition: A | Discharge status: Home / Self Care | Payer: Self-pay | Source: Ambulatory Visit | Attending: Internal Medicine

## 2013-03-06 DIAGNOSIS — I4892 Unspecified atrial flutter: Secondary | ICD-10-CM

## 2013-03-06 HISTORY — PX: ATRIAL FLUTTER ABLATION: SHX5733

## 2013-03-06 HISTORY — DX: Pneumonia, unspecified organism: J18.9

## 2013-03-06 HISTORY — DX: Headache: R51

## 2013-03-06 HISTORY — DX: Sleep apnea, unspecified: G47.30

## 2013-03-06 LAB — GLUCOSE, CAPILLARY: Glucose-Capillary: 115 mg/dL — ABNORMAL HIGH (ref 70–99)

## 2013-03-06 SURGERY — ATRIAL FLUTTER ABLATION
Anesthesia: Monitor Anesthesia Care

## 2013-03-06 MED ORDER — ONDANSETRON HCL 4 MG/2ML IJ SOLN: 4.0000 mg | Freq: Four times a day (QID) | INTRAMUSCULAR | Status: DC | PRN

## 2013-03-06 MED ORDER — FENTANYL CITRATE 0.05 MG/ML IJ SOLN
INTRAMUSCULAR | Status: DC | PRN
  Administered 2013-03-06: 50 ug via INTRAVENOUS
  Administered 2013-03-06: 25 ug via INTRAVENOUS
  Administered 2013-03-06: 50 ug via INTRAVENOUS

## 2013-03-06 MED ORDER — BUPIVACAINE HCL (PF) 0.25 % IJ SOLN
INTRAMUSCULAR | Status: AC
  Filled 2013-03-06: qty 60

## 2013-03-06 MED ORDER — LIDOCAINE HCL (CARDIAC) 20 MG/ML IV SOLN
INTRAVENOUS | Status: DC | PRN
  Administered 2013-03-06: 100 mg via INTRAVENOUS

## 2013-03-06 MED ORDER — PROPOFOL 10 MG/ML IV BOLUS
INTRAVENOUS | Status: DC | PRN
  Administered 2013-03-06: 200 mg via INTRAVENOUS

## 2013-03-06 MED ORDER — SODIUM CHLORIDE 0.9 % IV SOLN: 250.0000 mL | INTRAVENOUS | Status: DC | PRN

## 2013-03-06 MED ORDER — NEOSTIGMINE METHYLSULFATE 1 MG/ML IJ SOLN
INTRAMUSCULAR | Status: DC | PRN
  Administered 2013-03-06: 5 mg via INTRAVENOUS

## 2013-03-06 MED ORDER — TAMSULOSIN HCL 0.4 MG PO CAPS
0.4000 mg | ORAL_CAPSULE | Freq: Every day | ORAL | Status: DC
  Administered 2013-03-06 – 2013-03-07 (×2): 0.4 mg via ORAL
  Filled 2013-03-06 (×2): qty 1

## 2013-03-06 MED ORDER — DILTIAZEM HCL ER COATED BEADS 120 MG PO CP24
120.0000 mg | ORAL_CAPSULE | Freq: Every day | ORAL | Status: DC
  Administered 2013-03-06: 120 mg via ORAL
  Filled 2013-03-06 (×2): qty 1

## 2013-03-06 MED ORDER — SODIUM CHLORIDE 0.9 % IJ SOLN: 3.0000 mL | INTRAMUSCULAR | Status: DC | PRN

## 2013-03-06 MED ORDER — LISINOPRIL 5 MG PO TABS
5.0000 mg | ORAL_TABLET | Freq: Every day | ORAL | Status: DC
  Administered 2013-03-06: 5 mg via ORAL
  Filled 2013-03-06 (×2): qty 1

## 2013-03-06 MED ORDER — SODIUM CHLORIDE 0.9 % IV SOLN
INTRAVENOUS | Status: AC
  Administered 2013-03-06: 11:00:00 via INTRAVENOUS

## 2013-03-06 MED ORDER — GLYCOPYRROLATE 0.2 MG/ML IJ SOLN
INTRAMUSCULAR | Status: DC | PRN
  Administered 2013-03-06: .8 mg via INTRAVENOUS

## 2013-03-06 MED ORDER — PANTOPRAZOLE SODIUM 40 MG PO TBEC: 40.0000 mg | DELAYED_RELEASE_TABLET | Freq: Every day | ORAL | Status: DC

## 2013-03-06 MED ORDER — ROCURONIUM BROMIDE 100 MG/10ML IV SOLN
INTRAVENOUS | Status: DC | PRN
  Administered 2013-03-06: 20 mg via INTRAVENOUS
  Administered 2013-03-06: 50 mg via INTRAVENOUS

## 2013-03-06 MED ORDER — ARTIFICIAL TEARS OP OINT
TOPICAL_OINTMENT | OPHTHALMIC | Status: DC | PRN
  Administered 2013-03-06: 1 via OPHTHALMIC

## 2013-03-06 MED ORDER — OFF THE BEAT BOOK
Freq: Once | Status: AC
  Administered 2013-03-06: 21:00:00
  Filled 2013-03-06: qty 1

## 2013-03-06 MED ORDER — MIDAZOLAM HCL 5 MG/5ML IJ SOLN
INTRAMUSCULAR | Status: DC | PRN
  Administered 2013-03-06 (×2): 1 mg via INTRAVENOUS

## 2013-03-06 MED ORDER — APIXABAN 5 MG PO TABS
5.0000 mg | ORAL_TABLET | Freq: Two times a day (BID) | ORAL | Status: DC
  Administered 2013-03-06 – 2013-03-07 (×2): 5 mg via ORAL
  Filled 2013-03-06 (×3): qty 1

## 2013-03-06 MED ORDER — ACETAMINOPHEN 325 MG PO TABS
650.0000 mg | ORAL_TABLET | ORAL | Status: DC | PRN
  Administered 2013-03-07: 650 mg via ORAL
  Filled 2013-03-06: qty 2

## 2013-03-06 MED ORDER — ONDANSETRON HCL 4 MG/2ML IJ SOLN
INTRAMUSCULAR | Status: DC | PRN
  Administered 2013-03-06: 4 mg via INTRAVENOUS

## 2013-03-06 MED ORDER — SODIUM CHLORIDE 0.9 % IV SOLN
INTRAVENOUS | Status: DC | PRN
  Administered 2013-03-06: 07:00:00 via INTRAVENOUS

## 2013-03-06 MED ORDER — SODIUM CHLORIDE 0.9 % IJ SOLN
3.0000 mL | Freq: Two times a day (BID) | INTRAMUSCULAR | Status: DC
  Administered 2013-03-06: 3 mL via INTRAVENOUS

## 2013-03-06 MED ORDER — OXYCODONE-ACETAMINOPHEN 5-325 MG PO TABS: 1.0000 | ORAL_TABLET | ORAL | Status: DC | PRN

## 2013-03-06 MED ORDER — SODIUM CHLORIDE 0.9 % IJ SOLN: 3.0000 mL | Freq: Two times a day (BID) | INTRAMUSCULAR | Status: DC

## 2013-03-06 NOTE — Preoperative (Signed)
Beta Blockers   Reason not to administer Beta Blockers:Not Applicable 

## 2013-03-06 NOTE — Anesthesia Postprocedure Evaluation (Signed)
  Anesthesia Post-op Note  Patient: Dennis Hill  Procedure(s) Performed: Procedure(s): ATRIAL FLUTTER ABLATION (N/A)  Patient Location: PACU  Anesthesia Type:General  Level of Consciousness: awake and alert   Airway and Oxygen Therapy: Patient Spontanous Breathing  Post-op Pain: none  Post-op Assessment: Post-op Vital signs reviewed, Patient's Cardiovascular Status Stable, Respiratory Function Stable, Patent Airway, No signs of Nausea or vomiting and Pain level controlled  Post-op Vital Signs: stable  Complications: No apparent anesthesia complications

## 2013-03-06 NOTE — Anesthesia Preprocedure Evaluation (Addendum)
Anesthesia Evaluation  Patient identified by MRN, date of birth, ID band Patient awake    Reviewed: Allergy & Precautions, H&P , NPO status , Patient's Chart, lab work & pertinent test results  Airway Mallampati: II TM Distance: >3 FB Neck ROM: Full    Dental   Pulmonary sleep apnea ,  breath sounds clear to auscultation        Cardiovascular + dysrhythmias Atrial Fibrillation Rhythm:Irregular Rate:Tachycardia     Neuro/Psych Anxiety Depression  Neuromuscular disease    GI/Hepatic GERD-  ,  Endo/Other  diabetes  Renal/GU Renal InsufficiencyRenal disease     Musculoskeletal   Abdominal (+) + obese,   Peds  Hematology   Anesthesia Other Findings   Reproductive/Obstetrics                           Anesthesia Physical Anesthesia Plan  ASA: III  Anesthesia Plan: General   Post-op Pain Management:    Induction: Intravenous  Airway Management Planned: Oral ETT and Video Laryngoscope Planned  Additional Equipment:   Intra-op Plan:   Post-operative Plan: Extubation in OR  Informed Consent: I have reviewed the patients History and Physical, chart, labs and discussed the procedure including the risks, benefits and alternatives for the proposed anesthesia with the patient or authorized representative who has indicated his/her understanding and acceptance.     Plan Discussed with: CRNA and Surgeon  Anesthesia Plan Comments: (oet requested by cardiologist)       Anesthesia Quick Evaluation

## 2013-03-06 NOTE — H&P (View-Only) (Signed)
 ELECTROPHYSIOLOGY CONSULT NOTE  Patient ID: Dennis Hill, MRN: [id number], DOB/AGE: 69/15/1947 66 y.o. Admit date: (Not on file) Date of Consult: 02/04/2013  Primary Physician: James John, MD Primary Cardiologist: CM Chief Complaint:  fibrillation   HPI Dennis Hill is a 66 y.o. male whom I saw many years ago for what sounds like atrial ectopy who was recently discovered to be"atrial fibrillation".  This was noted on a routine office visit. He complained of having a few episodes of dyspnea on exertion. He had no chest discomfort.. He was treated at that time with aspirin and carvedilol the latter of which was discontinued because of fatigue and Cardizem was initiated. Since that time he has felt much better.    He has thromboembolic risk factors notable for diabetes and age for a CHADS2 score of around and a CHADS-VASc score of 2   Rivaroxaban was initiated. Echocardiogram had demonstrated ejection fraction 35-40% with mild-moderate left atrial enlargement    Past Medical History  Diagnosis Date  . DIABETES MELLITUS, TYPE II 01/28/2010    diet controlled  . DEPRESSION 03/15/2009  . GERD 03/15/2009  . DIVERTICULOSIS, COLON 03/15/2009  . 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 FOOT PAIN, LEFT 04/05/2010  . RUQ PAIN 01/28/2010  . Hx of adenomatous colonic polyps 03/15/2009  . NEPHROLITHIASIS, HX OF 01/28/2010  . Atrial bigeminy 2008    Dr. Teancum Brule  . Anxiety   . Chronic insomnia   . Fatty liver   . Internal hemorrhoids   . Hyperlipidemia 02/10/2011      Surgical History:  Past Surgical History  Procedure Laterality Date  . Appendectomy    . Cholecystectomy  2006  . Tonsillectomy    . Inguinal herniorrhapy    . Hernia repair  1986    right inguinal     Home Meds: Prior to Admission medications   Medication Sig Start Date End Date Taking? Authorizing Provider  diltiazem (CARDIZEM CD) 120 MG 24 hr capsule Take 1 capsule (120 mg  total) by mouth daily. 12/24/12 12/24/13 Yes Christopher D McAlhany, MD  esomeprazole (NEXIUM) 40 MG capsule Take 1 capsule (40 mg total) by mouth daily before breakfast. 12/30/12  Yes James W John, MD  Rivaroxaban (XARELTO) 20 MG TABS Take 1 tablet (20 mg total) by mouth daily. 01/28/13  Yes Christopher D McAlhany, MD     Allergies: No Known Allergies  History   Social History  . Marital Status: Single    Spouse Name: N/A    Number of Children: 2  . Years of Education: N/A   Occupational History  . 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 Smoking status: Former Smoker -- 1.00 packs/day for 6 years    Types: Cigarettes    Quit date: 12/25/1979  . Smokeless tobacco: Never Used  . Alcohol Use: 2.4 oz/week    4 Glasses of wine per week     Comment: social  . Drug Use: No  . Sexually Active: Not on file   Other Topics Concern  . Not on file   Social History Narrative   Step daughter and 2 8 yo twins live with      Family History  Problem Relation Age of Onset  . Lung cancer Mother 70  . Colon cancer Mother   . CAD Neg Hx   . Heart attack Brother      ROS:  Please see the history of present   illness.     All other systems reviewed and negative.    Physical Exam:   Blood pressure 126/80, pulse 71, height 6' 5" (1.956 m), weight 283 lb (128.368 kg). General: Well developed, well nourished male in no acute distress. Head: Normocephalic, atraumatic, sclera non-icteric, no xanthomas, nares are without discharge. EENT: normal Lymph Nodes:  none Back: without scoliosis/kyphosis , no CVA tendersness Neck: Negative for carotid bruits. JVD 9-10  Lungs: Clear bilaterally to auscultation without wheezes, rales, or rhonchi. Breathing is unlabored. Heart: RRR with S1 S2. No murmur , rubs, or gallops appreciated. Abdomen: Soft, non-tender, non-distended with normoactive bowel sounds. No hepatomegaly. No rebound/guarding. No obvious abdominal masses. Msk:   Strength and tone appear normal for age. Extremities: No clubbing or cyanosis. 2+  edema.  Distal pedal pulses are 2+ and equal bilaterally. Skin: Warm and Dry Neuro: Alert and oriented X 3. CN III-XII intact Grossly normal sensory and motor function . Psych:  Responds to questions appropriately with a normal affect.      Labs: Cardiac Enzymes No results found for this basename: CKTOTAL, CKMB, TROPONINI,  in the last 72 hours CBC Lab Results  Component Value Date   WBC 7.2 12/09/2012   HGB 14.9 12/09/2012   HCT 42.9 12/09/2012   MCV 87.1 12/09/2012   PLT 209.0 12/09/2012   PROTIME: No results found for this basename: LABPROT, INR,  in the last 72 hours Chemistry No results found for this basename: NA, K, CL, CO2, BUN, CREATININE, CALCIUM, LABALBU, PROT, BILITOT, ALKPHOS, ALT, AST, GLUCOSE,  in the last 168 hours Lipids Lab Results  Component Value Date   CHOL 145 12/09/2012   HDL 29.30* 12/09/2012   LDLCALC 101* 12/09/2012   TRIG 76.0 12/09/2012   BNP No results found for this basename: probnp   Miscellaneous No results found for this basename: DDIMER    Radiology/Studies:  No results found.  EKG:  afklutter with variable  conduction  Assessment and Plan:  Is a  Dennis Hill   

## 2013-03-06 NOTE — Transfer of Care (Signed)
Immediate Anesthesia Transfer of Care Note  Patient: Dennis Hill  Procedure(s) Performed: Procedure(s): ATRIAL FLUTTER ABLATION (N/A)  Patient Location: PACU  Anesthesia Type:General  Level of Consciousness: awake and oriented  Airway & Oxygen Therapy: Patient Spontanous Breathing and Patient connected to nasal cannula oxygen  Post-op Assessment: Report given to PACU RN, Post -op Vital signs reviewed and stable and Patient moving all extremities X 4  Post vital signs: Reviewed and stable  Complications: No apparent anesthesia complications

## 2013-03-06 NOTE — Anesthesia Procedure Notes (Signed)
Procedure Name: Intubation Date/Time: 03/06/2013 8:03 AM Performed by: Sherie Don Pre-anesthesia Checklist: Patient identified, Emergency Drugs available, Suction available, Patient being monitored and Timeout performed Patient Re-evaluated:Patient Re-evaluated prior to inductionOxygen Delivery Method: Circle system utilized Preoxygenation: Pre-oxygenation with 100% oxygen Intubation Type: IV induction Ventilation: Mask ventilation without difficulty and Oral airway inserted - appropriate to patient size Laryngoscope Size: Mac and 3 (Glidescope) Tube type: Oral Tube size: 8.0 mm Number of attempts: 1 Airway Equipment and Method: Stylet Placement Confirmation: ETT inserted through vocal cords under direct vision,  positive ETCO2 and breath sounds checked- equal and bilateral Secured at: 24 cm Tube secured with: Tape Dental Injury: Teeth and Oropharynx as per pre-operative assessment

## 2013-03-06 NOTE — Op Note (Signed)
NAME:  NIKOLA, MARONE NO.:  [id number]  MEDICAL RECORD NO.:  [id number]  LOCATION:  [facility]                         FACILITY:  [facility]  PHYSICIAN:  Duke Salvia, MD, FACCDATE OF BIRTH:  Dec 18, 1945  DATE OF PROCEDURE:  03/06/2013 DATE OF DISCHARGE:                              OPERATIVE REPORT   PREOPERATIVE DIAGNOSIS:  Atrial flutter.  POSTOPERATIVE DIAGNOSIS:  Sinus rhythm.  PROCEDURE:  Invasive electrophysiological study, substrate mapping, and catheter ablation.  DESCRIPTION OF PROCEDURE:  After obtaining informed consent, the patient was brought to electrophysiology laboratory and placed on the fluoroscopic table in supine position after he was submitted to the general anesthesia under the care of Dr. Rodman Pickle.  After routine prep and drape, cardiac catheterization was performed.  Following the procedure, the catheters were removed.  The sheaths were left in place. The patient was transferred to the holding area for sheath removal.  Catheters:  A 5-French quadripolar catheter was inserted via the left femoral vein to the AV junction to measure his electrogram.  A 6-French quadripolar catheter was inserted via the right femoral vein to the coronary sinus.  A 7-French dual decapolar catheter was inserted via left femoral vein to mapping sites in the tricuspid anulus.  An 8-French 10 mm deflectable tip catheter was inserted via SAFL sheath to mapping sites in the posterior septal space.  Surface leads: AVF and V1 were monitored continuously throughout the procedure.  Following insertion of the catheters, a stimulation protocol included incremental atrial pacing.  Incremental ventricular pacing.  Single atrial extrastimuli paced cycle length of 600 milliseconds.  END-TIDAL SURFACE ELECTROCARDIOGRAM AND BASIC INTERVALS:  Initial:  Rhythm:  Atrial flutter; RR interval about 500 milliseconds with variable.  QRS duration:  96 milliseconds; QT interval:   N/M; and AA interval 250 milliseconds.  A/H interval:  N/A; HV interval:  40 milliseconds.  Final: Rhythm:  Sinus; RR interval 784 milliseconds; PR interval 178 milliseconds; P-wave duration 148 milliseconds; QRS duration 98 milliseconds; QT interval 414 milliseconds.  AH interval:  127 milliseconds; HV interval:  41 milliseconds.  END-TIDAL AV NODAL FUNCTION:  AV Wenckebach was 360 milliseconds.  VA Wenckebach was at 500 milliseconds.  AV nodal effective refractory period at 600 milliseconds was 290 milliseconds.  No evidence of dual AV nodal physiology was identified.  HIS-PURKINJE SYSTEM FUNCTION:  Normal; accessory pathway function:  No evidence of accessory pathway was identified.  Ventricular response to programed stimulation was normal for ventricular stimulation as described.  Arrhythmias induced.  The patient presented to the lab in atrial flutter.  Electrogram mapping confirmed counterclockwise conduction around the tricuspid isthmus.  RF energy delivered across the cavotricuspid isthmus successfully terminated atrial flutter and resulted in bidirectional isthmus block with an A1/A2 interval of 160 milliseconds.  Fluoroscopy:  Total of 9.7 minutes of fluoroscopy time was utilized at 15 frames per second.  Radiofrequency energy:  A total of 6 minutes and 37 seconds of RF energy was applied across the cavotricuspid isthmus.  Following ablation, no further atrial electrogram amplitudes were noted across the cavotricuspid isthmus for the most part.  The A1/A2 interval noted was 150+ milliseconds.  IMPRESSION: 1. Normal sinus function. 2. Abnormal atrial function manifested by sustained  atrial flutter. 3. Normal AV nodal function. 4. Normal His-Purkinje system function. 5. No accessory pathway. 6. Normal ventricular response programed stimulation.  SUMMARY:  In conclusion, the results of electrophysiological testing confirmed cavotricuspid isthmus dependent  atrial flutter.  Catheter ablation across the isthmus successfully terminated the flutter and rendered bidirectional isthmus conduction block with A1/A2 interval as noted above.  The patient will be maintained on apixaban for 3-4 weeks, and that will be discontinued.  The patient tolerated the procedure well without apparent complication.     Duke Salvia, MD, [facility]     SCK/MEDQ  D:  03/06/2013  T:  03/06/2013  Job:  [id number]

## 2013-03-06 NOTE — CV Procedure (Signed)
Dennis Hill [id number]  [id number]  Preop Dx: aflutter  Postop Dx same/   Procedure:EPS with LA mapping, arrhtymia mapping and RFCA of SVT  Cx: None   Dictation number [id number]  Sherryl Manges, MD 03/06/2013 9:29 AM

## 2013-03-06 NOTE — Interval H&P Note (Signed)
History and Physical Interval Note:  03/06/2013 7:39 AM  Dennis Hill  has presented today for surgery, with the diagnosis of A-flutter  The various methods of treatment have been discussed with the patient and family. After consideration of risks, benefits and other options for treatment, the patient has consented to  Procedure(s): ATRIAL FLUTTER ABLATION (N/A) as a surgical intervention .  The patient's history has been reviewed, patient examined, no change in status, stable for surgery.  I have reviewed the patient's chart and labs.  Questions were answered to the patient's satisfaction.     Sherryl Manges  Interval bleeding assoc with nephrolithiasis  Now on apixoban and s/p TEE yesteday without evidence of LAA clot;  EF 40-45%

## 2013-03-07 DIAGNOSIS — I4892 Unspecified atrial flutter: Secondary | ICD-10-CM

## 2013-03-07 NOTE — Discharge Summary (Signed)
ELECTROPHYSIOLOGY PROCEDURE DISCHARGE SUMMARY    Patient ID: Dennis Hill,  MRN: [id number], DOB/AGE: 1946/06/11 67 y.o.  Admit date: 03/06/2013 Discharge date: 03/07/2013  Primary Care Physician: Oliver Barre, MD Primary Cardiologist: Doylene Bode, MD  Primary Discharge Diagnosis:  Atrial flutter status post ablation this admission  Secondary Discharge Diagnosis:  1.  Type II diabetes 2.  Depression 3.  GERD 4.  Hyperlipidemia 5.  Insomnia  Procedures This Admission:  1.  Electrophysiology study and radiofrequency catheter ablation on 03-06-2013 by Dr Graciela Husbands.  This study demonstrated cavotricuspid isthmus dependent atrial flutter.  This was successfully terminated with ablation.  There were no early apparent complications.   Brief HPI: [name] is a 67 year old male with a history of atrial ectopy for many years.  He was recently found to be in atrial flutter. He was also noted to have a cardiomyopathy with an EF of 35-40%.  He was cardioverted with plans to reassess EF in sinus rhythm and undertake atrial flutter ablation.  TEE was carried out on 6-18 which demonstrated an EF of 40-45% with no LAA thrombus. Risks, benefits, and alternatives to ablation were reviewed with the patient who wished to proceed.    Hospital Course:  The patient was admitted and underwent ablation of atrial flutter by Dr Graciela Husbands with details as outlined above.  He was monitored on telemetry overnight which demonstrated sinus rhythm. His groin incision was without complication.  He was examined by Dr Graciela Husbands and considered stable for discharge to home.  Plan per Dr Graciela Husbands is to continue anticoagulation for 4 weeks following ablation then discontinue.  He has follow up scheduled with Dr Graciela Husbands in 4 weeks.   Discharge Vitals: Blood pressure 118/60, pulse 71, temperature 97.7 F (36.5 C), temperature source Oral, resp. rate 20, height 6\' 5"  (1.956 m), weight 276 lb 0.3 oz (125.2 kg), SpO2 97.00%.      Well developed and nourished in no acute distress HENT normal Neck supple with JVP-flat Clear Regular rate and rhythm, no murmurs or gallops Abd-soft with active BS No Clubbing cyanosis edema Skin-warm and dry A & Oriented  Grossly normal sensory and motor function Groins ok  Labs:   Lab Results  Component Value Date   WBC 7.6 03/04/2013   HGB 14.8 03/04/2013   HCT 44.6 03/04/2013   MCV 89.2 03/04/2013   PLT 219.0 03/04/2013    Recent Labs Lab 03/04/13 0939  NA 144  K 4.7  CL 109  CO2 31  BUN 18  CREATININE 1.2  CALCIUM 10.2  GLUCOSE 100*   Discharge Medications:    Medication List    ASK your doctor about these medications       acetaminophen 500 MG tablet  Commonly known as:  TYLENOL  Take 500 mg by mouth every 6 (six) hours as needed for pain.     apixaban 5 MG Tabs tablet  Commonly known as:  ELIQUIS  Take 1 tablet (5 mg total) by mouth 2 (two) times daily.     diltiazem 120 MG 24 hr capsule  Commonly known as:  CARDIZEM CD  Take 1 capsule (120 mg total) by mouth daily.     esomeprazole 40 MG capsule  Commonly known as:  NEXIUM  Take 40 mg by mouth daily as needed (for stomach acid).     fexofenadine 180 MG tablet  Commonly known as:  ALLEGRA  Take 1 tablet (180 mg total) by mouth daily.  fluticasone 50 MCG/ACT nasal spray  Commonly known as:  FLONASE  Place 2 sprays into the nose daily.     lisinopril 5 MG tablet  Commonly known as:  PRINIVIL,ZESTRIL  Take 5 mg by mouth daily. Take one tablet by mouth once daily     naproxen sodium 220 MG tablet  Commonly known as:  ANAPROX  Take 440 mg by mouth daily as needed (for pain).     oxyCODONE-acetaminophen 5-325 MG per tablet  Commonly known as:  PERCOCET  Take 1-2 tablets by mouth every 4 (four) hours as needed for pain.     tamsulosin 0.4 MG Caps  Commonly known as:  FLOMAX  Take 1 capsule (0.4 mg total) by mouth daily.        Disposition:       Future Appointments Provider  Department Dept Phone   04/03/2013 12:00 PM Duke Salvia, MD [facility] Main Office [place]) [phone]   06/23/2013 8:00 AM Corwin Levins, MD [facility] [phone]       Duration of Discharge Encounter: Greater than 30 minutes including physician time.  Signed, Gypsy Balsam, RN, BSN 03/07/2013, 7:06 AM

## 2013-03-19 ENCOUNTER — Encounter: Payer: Self-pay | Admitting: *Deleted

## 2013-03-19 NOTE — Telephone Encounter (Signed)
A user error has taken place: encounter opened in error, closed for administrative reasons.

## 2013-03-20 ENCOUNTER — Telehealth: Payer: Self-pay | Admitting: *Deleted

## 2013-03-20 ENCOUNTER — Telehealth: Payer: Self-pay | Admitting: Internal Medicine

## 2013-03-20 ENCOUNTER — Ambulatory Visit (INDEPENDENT_AMBULATORY_CARE_PROVIDER_SITE_OTHER): Payer: Medicare Other | Admitting: Internal Medicine

## 2013-03-20 ENCOUNTER — Encounter: Payer: Self-pay | Admitting: Internal Medicine

## 2013-03-20 VITALS — BP 110/72 | HR 71 | Temp 97.2°F | Ht 77.0 in | Wt 280.4 lb

## 2013-03-20 DIAGNOSIS — E119 Type 2 diabetes mellitus without complications: Secondary | ICD-10-CM

## 2013-03-20 DIAGNOSIS — I4892 Unspecified atrial flutter: Secondary | ICD-10-CM

## 2013-03-20 DIAGNOSIS — N400 Enlarged prostate without lower urinary tract symptoms: Secondary | ICD-10-CM

## 2013-03-20 DIAGNOSIS — N200 Calculus of kidney: Secondary | ICD-10-CM

## 2013-03-20 MED ORDER — TAMSULOSIN HCL 0.4 MG PO CAPS: 0.4000 mg | ORAL_CAPSULE | Freq: Every day | ORAL | Status: DC

## 2013-03-20 MED ORDER — APIXABAN 5 MG PO TABS: 5.0000 mg | ORAL_TABLET | Freq: Two times a day (BID) | ORAL | Status: DC

## 2013-03-20 NOTE — Telephone Encounter (Signed)
Fax received from [employer] - prior authorization needed for Eliquis 5 mg twice daily. I called [phone] and obtained prior authorization for the medication. I left a message at [facility] that this has been done. Confirmation # = [id number].

## 2013-03-20 NOTE — Telephone Encounter (Signed)
New Prob     Has some questions regarding ELIQUIS. Please call.

## 2013-03-20 NOTE — Progress Notes (Signed)
Subjective:    Patient ID: Dennis Hill, male    DOB: 09/11/1946, 67 y.o.   MRN: [id number]  HPI  Here to f/u, had recent renal stones after seen here recently with hematuria on xarelto, tx with flomax for stone, but now realizes this really helped his urine flow, asks for longer term prescription. Now sp EP procedure, for f/u with Dr Graciela Husbands soon, is taking the eliquis. No other overt bleeding or brusing.  Denies worsening reflux, abd pain, dysphagia, n/v, bowel change or blood. Pt denies chest pain, increased sob or doe, wheezing, orthopnea, PND, increased LE swelling, palpitations, dizziness or syncope. Still working Engineer, drilling, quite successful.  Pt denies polydipsia, polyuria,  Past Medical History  Diagnosis Date  . DIABETES MELLITUS, TYPE II 01/28/2010    diet controlled  . DEPRESSION 03/15/2009  . GERD 03/15/2009  . DIVERTICULOSIS, COLON 03/15/2009  . 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 FOOT PAIN, LEFT 04/05/2010  . RUQ PAIN 01/28/2010  . Hx of adenomatous colonic polyps 03/15/2009  . NEPHROLITHIASIS, HX OF 01/28/2010  . Anxiety   . Chronic insomnia   . Fatty liver   . Internal hemorrhoids   . Hyperlipidemia 02/10/2011  . Atrial bigeminy 2008    Dr. Graciela Husbands  . Atrial fibrillation   . Atrial flutter   . Pneumonia ~ 1950  . Exertional shortness of breath     "lately" (03/06/2013)  . Sleep apnea   . WUJWJXBJ(478.2)     "monthly" (03/06/2013)  . NEPHROLITHIASIS 03/19/2009   Past Surgical History  Procedure Laterality Date  . Appendectomy    . Cholecystectomy  2006  . Tonsillectomy and adenoidectomy  ~ 1952  . Inguinal hernia repair Right 1986  . Cardioversion N/A 02/06/2013    Procedure: CARDIOVERSION;  Surgeon: Marinus Maw, MD;  Location: [facility] ENDOSCOPY;  Service: Cardiovascular;  Laterality: N/A;  . Tee without cardioversion N/A 03/05/2013    Procedure: TRANSESOPHAGEAL ECHOCARDIOGRAM (TEE);  Surgeon: Laurey Morale, MD;  Location: [facility] ENDOSCOPY;   Service: Cardiovascular;  Laterality: N/A;  Elita Quick [place]  . Atrial flutter ablation  03/06/2013    reports that he quit smoking about 33 years ago. His smoking use included Cigarettes. He has a 13 pack-year smoking history. He has never used smokeless tobacco. He reports that he drinks about 0.6 ounces of alcohol per week. He reports that he does not use illicit drugs. family history includes Colon cancer in his mother and Lung cancer (age of onset: 56) in his mother.  There is no history of CAD. No Known Allergies Current Outpatient Prescriptions on File Prior to Visit  Medication Sig Dispense Refill  . acetaminophen (TYLENOL) 500 MG tablet Take 500 mg by mouth every 6 (six) hours as needed for pain.      Marland Kitchen apixaban (ELIQUIS) 5 MG TABS tablet Take 1 tablet (5 mg total) by mouth 2 (two) times daily.  60 tablet  0  . diltiazem (CARDIZEM CD) 120 MG 24 hr capsule Take 1 capsule (120 mg total) by mouth daily.  30 capsule  6  . esomeprazole (NEXIUM) 40 MG capsule Take 40 mg by mouth daily as needed (for stomach acid).      . fexofenadine (ALLEGRA) 180 MG tablet Take 1 tablet (180 mg total) by mouth daily.  30 tablet  2  . fluticasone (FLONASE) 50 MCG/ACT nasal spray Place 2 sprays into the nose daily.  16 g  2  . lisinopril (PRINIVIL,ZESTRIL) 5 MG tablet Take 5  mg by mouth daily. Take one tablet by mouth once daily       No current facility-administered medications on file prior to visit.   Review of Systems  Constitutional: Negative for unexpected weight change, or unusual diaphoresis  HENT: Negative for tinnitus.   Eyes: Negative for photophobia and visual disturbance.  Respiratory: Negative for choking and stridor.   Gastrointestinal: Negative for vomiting and blood in stool.  Genitourinary: Negative for hematuria and decreased urine volume.  Musculoskeletal: Negative for acute joint swelling Skin: Negative for color change and wound.  Neurological: Negative for tremors and numbness other than  noted  Psychiatric/Behavioral: Negative for decreased concentration or  hyperactivity.       Objective:   Physical Exam BP 110/72  Pulse 71  Temp(Src) 97.2 F (36.2 C) (Oral)  Ht 6\' 5"  (1.956 m)  Wt 280 lb 6 oz (127.177 kg)  BMI 33.24 kg/m2  SpO2 97% VS noted,  Constitutional: Pt appears well-developed and well-nourished.  HENT: Head: NCAT.  Right Ear: External ear normal.  Left Ear: External ear normal.  Eyes: Conjunctivae and EOM are normal. Pupils are equal, round, and reactive to light.  Neck: Normal range of motion. Neck supple.  Cardiovascular: Normal rate and regular rhythm.   Pulmonary/Chest: Effort normal and breath sounds normal.  Abd:  Soft, NT, non-distended, + BS Neurological: Pt is alert. Not confused  Skin: Skin is warm. No erythema.  Psychiatric: Pt behavior is normal. Thought content normal.     Assessment & Plan:

## 2013-03-20 NOTE — Patient Instructions (Signed)
Please continue all other medications as before, and refills have been done if requested, including the flomax Please have the pharmacy call with any other refills you may need. Please keep your appointments with your specialists as you have planned  Please remember to sign up for My Chart if you have not done so, as this will be important to you in the future with finding out test results, communicating by private email, and scheduling acute appointments online when needed.

## 2013-03-20 NOTE — Telephone Encounter (Signed)
I called and spoke with the pharmacy at [facility].

## 2013-03-21 ENCOUNTER — Encounter: Payer: Self-pay | Admitting: Internal Medicine

## 2013-03-21 DIAGNOSIS — N200 Calculus of kidney: Secondary | ICD-10-CM | POA: Insufficient documentation

## 2013-03-21 DIAGNOSIS — N400 Enlarged prostate without lower urinary tract symptoms: Secondary | ICD-10-CM

## 2013-03-21 HISTORY — DX: Benign prostatic hyperplasia without lower urinary tract symptoms: N40.0

## 2013-03-21 NOTE — Assessment & Plan Note (Signed)
Improved flow with flomax, to cont per pt request,  to f/u any worsening symptoms or concerns

## 2013-03-21 NOTE — Assessment & Plan Note (Signed)
stable overall by history and exam, recent data reviewed with pt, and pt to continue medical treatment as before,  to f/u any worsening symptoms or concerns Lab Results  Component Value Date   HGBA1C 6.7* 12/10/2012

## 2013-03-21 NOTE — Assessment & Plan Note (Signed)
Symptoms resolved, declines further eval or referral for now

## 2013-03-24 ENCOUNTER — Other Ambulatory Visit: Payer: Self-pay

## 2013-04-01 ENCOUNTER — Encounter: Payer: Self-pay | Admitting: *Deleted

## 2013-04-03 ENCOUNTER — Encounter: Payer: Self-pay | Admitting: Internal Medicine

## 2013-04-03 ENCOUNTER — Ambulatory Visit (INDEPENDENT_AMBULATORY_CARE_PROVIDER_SITE_OTHER): Payer: Medicare Other | Admitting: Internal Medicine

## 2013-04-03 VITALS — BP 134/81 | HR 66 | Ht 77.0 in | Wt 278.0 lb

## 2013-04-03 DIAGNOSIS — I4892 Unspecified atrial flutter: Secondary | ICD-10-CM

## 2013-04-03 DIAGNOSIS — I420 Dilated cardiomyopathy: Secondary | ICD-10-CM

## 2013-04-03 DIAGNOSIS — I428 Other cardiomyopathies: Secondary | ICD-10-CM

## 2013-04-03 DIAGNOSIS — R0683 Snoring: Secondary | ICD-10-CM

## 2013-04-03 MED ORDER — LISINOPRIL 10 MG PO TABS: 10.0000 mg | ORAL_TABLET | Freq: Every day | ORAL | Status: DC

## 2013-04-03 NOTE — Progress Notes (Signed)
Patient Care Team: Corwin Levins, MD as PCP - General   HPI  Dennis Hill is a 67 y.o. male  seen in followup for a flutter ablation undertaking June 2014. His ejection fraction was 35-45%. He is much improved following restoration of sinus rhythm with improved energy.  He has a history of snoring. He has daytime somnolence. He has daytime fatigue.  Past Medical History  Diagnosis Date  . DIABETES MELLITUS, TYPE II 01/28/2010    diet controlled  . DEPRESSION 03/15/2009  . GERD 03/15/2009  . DIVERTICULOSIS, COLON 03/15/2009  . 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 FOOT PAIN, LEFT 04/05/2010  . RUQ PAIN 01/28/2010  . Hx of adenomatous colonic polyps 03/15/2009  . NEPHROLITHIASIS, HX OF 01/28/2010  . Anxiety   . Chronic insomnia   . Fatty liver   . Internal hemorrhoids   . Hyperlipidemia 02/10/2011  . Atrial bigeminy 2008    Dr. Graciela Husbands  . Atrial fibrillation   . Atrial flutter   . Pneumonia ~ 1950  . Exertional shortness of breath     "lately" (03/06/2013)  . Sleep apnea   . WGNFAOZH(086.5)     "monthly" (03/06/2013)  . 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 BPH (benign prostatic hypertrophy) 03/21/2013    Past Surgical History  Procedure Laterality Date  . Appendectomy    . Cholecystectomy  2006  . Tonsillectomy and adenoidectomy  ~ 1952  . Inguinal hernia repair Right 1986  . Cardioversion N/A 02/06/2013    Procedure: CARDIOVERSION;  Surgeon: Marinus Maw, MD;  Location[facility] ENDOSCOPY;  Service: Cardiovascular;  Laterality: N/A;  . Tee without cardioversion N/A 03/05/2013    Procedure: TRANSESOPHAGEAL ECHOCARDIOGRAM (TEE);  Surgeon: Laurey Morale, MD;  Location: [facility] ENDOSCOPY;  Service: Cardiovascular;  Laterality: N/A;  Elita Quick [place]  . Atrial flutter ablation  03/06/2013    Current Outpatient Prescriptions  Medication Sig Dispense Refill  . acetaminophen (TYLENOL) 500 MG tablet Take 500 mg by mouth every 6 (six) hours as needed for pain.      Marland Kitchen apixaban (ELIQUIS) 5 MG TABS  tablet Take 1 tablet (5 mg total) by mouth 2 (two) times daily.  60 tablet  3  . diltiazem (CARDIZEM CD) 120 MG 24 hr capsule Take 1 capsule (120 mg total) by mouth daily.  30 capsule  6  . esomeprazole (NEXIUM) 40 MG capsule Take 40 mg by mouth daily as needed (for stomach acid).      . fexofenadine (ALLEGRA) 180 MG tablet Take 1 tablet (180 mg total) by mouth daily.  30 tablet  2  . fluticasone (FLONASE) 50 MCG/ACT nasal spray Place 2 sprays into the nose daily.  16 g  2  . lisinopril (PRINIVIL,ZESTRIL) 5 MG tablet Take 5 mg by mouth daily. Take one tablet by mouth once daily      . tamsulosin (FLOMAX) 0.4 MG CAPS Take 1 capsule (0.4 mg total) by mouth daily.  90 capsule  3   No current facility-administered medications for this visit.    No Known Allergies  Review of Systems negative except from HPI and PMH  Physical Exam BP 134/81  Pulse 66  Ht 6\' 5"  (1.956 m)  Wt 278 lb (126.1 kg)  BMI 32.96 kg/m2 Well developed and nourished in no acute distress HENT normal Neck supple with JVP-flat Carotids brisk and full without bruits Clear Regular rate and rhythm, no murmurs or gallops Abd-soft with active BS without hepatomegaly No Clubbing cyanosis edema Skin-warm and dry A &  Oriented  Grossly normal sensory and motor function  ECG demonstrates sinus rhythm at 66 Interval 17/10/37 Axis is8 Nonspecific ST changes  Assessment and  Plan

## 2013-04-03 NOTE — Assessment & Plan Note (Signed)
As above.

## 2013-04-03 NOTE — Assessment & Plan Note (Signed)
Status post catheter ablation. We will discontinue the diltiazem as there were his LV dysfunction. We'll increase his lisinopril for augmented control of his blood pressure.  We will reassess left ventricular function in about 3 weeks time to see if his cardiomyopathy has improved. We'll discontinue his apixaban

## 2013-04-03 NOTE — Patient Instructions (Addendum)
Your physician has recommended you make the following change in your medication:  1) Stop eliquis. 2) Stop cardizem (diltiazem) 3) Increase lisinopril to 10 mg one tablet by mouth daily  Your physician has requested that you have an echocardiogram in 3 weeks. Echocardiography is a painless test that uses sound waves to create images of your heart. It provides your doctor with information about the size and shape of your heart and how well your heart's chambers and valves are working. This procedure takes approximately one hour. There are no restrictions for this procedure.  Your physician recommends that you return for lab work in: 3 weeks (day of echo)- bmp  Your physician has recommended that you have a sleep study. This test records several body functions during sleep, including: brain activity, eye movement, oxygen and carbon dioxide blood levels, heart rate and rhythm, breathing rate and rhythm, the flow of air through your mouth and nose, snoring, body muscle movements, and chest and belly movement.

## 2013-04-17 ENCOUNTER — Other Ambulatory Visit (HOSPITAL_COMMUNITY): Payer: Self-pay | Admitting: Internal Medicine

## 2013-04-17 DIAGNOSIS — I4892 Unspecified atrial flutter: Secondary | ICD-10-CM

## 2013-04-17 DIAGNOSIS — I4891 Unspecified atrial fibrillation: Secondary | ICD-10-CM

## 2013-04-21 ENCOUNTER — Telehealth: Payer: Self-pay

## 2013-04-21 ENCOUNTER — Ambulatory Visit (HOSPITAL_COMMUNITY): Payer: Medicare Other | Attending: Internal Medicine | Admitting: Radiology

## 2013-04-21 ENCOUNTER — Other Ambulatory Visit (INDEPENDENT_AMBULATORY_CARE_PROVIDER_SITE_OTHER): Payer: Medicare Other

## 2013-04-21 DIAGNOSIS — I428 Other cardiomyopathies: Secondary | ICD-10-CM

## 2013-04-21 DIAGNOSIS — E785 Hyperlipidemia, unspecified: Secondary | ICD-10-CM | POA: Insufficient documentation

## 2013-04-21 DIAGNOSIS — I509 Heart failure, unspecified: Secondary | ICD-10-CM

## 2013-04-21 DIAGNOSIS — I4891 Unspecified atrial fibrillation: Secondary | ICD-10-CM

## 2013-04-21 DIAGNOSIS — E119 Type 2 diabetes mellitus without complications: Secondary | ICD-10-CM | POA: Insufficient documentation

## 2013-04-21 DIAGNOSIS — I4892 Unspecified atrial flutter: Secondary | ICD-10-CM

## 2013-04-21 DIAGNOSIS — E669 Obesity, unspecified: Secondary | ICD-10-CM | POA: Insufficient documentation

## 2013-04-21 LAB — BASIC METABOLIC PANEL
BUN: 17 mg/dL (ref 6–23)
Chloride: 106 mEq/L (ref 96–112)
Potassium: 4.4 mEq/L (ref 3.5–5.1)
Sodium: 140 mEq/L (ref 135–145)

## 2013-04-21 MED ORDER — LOSARTAN POTASSIUM 50 MG PO TABS: 50.0000 mg | ORAL_TABLET | Freq: Every day | ORAL | Status: DC

## 2013-04-21 NOTE — Telephone Encounter (Signed)
Left message on machine for pt to contact the office.  STOP Lisinopril, START Losartan 50mg  daily.  Rx already sent to [facility].

## 2013-04-21 NOTE — Telephone Encounter (Signed)
I would reduce the dose back to Lisinopril 5 mg daily and he continues to have symptoms, he should let us know and we can change to an ARB. Thanks, chris

## 2013-04-21 NOTE — Telephone Encounter (Signed)
Pt aware of instructions

## 2013-04-21 NOTE — Telephone Encounter (Signed)
The pt did have similar symptoms with the Lisinopril 5mg  but they were not as bad on lower dose. Do you want to continue with your plan?

## 2013-04-21 NOTE — Telephone Encounter (Signed)
The pt was in the office today for an Echo and lab appointment. The pt asked to speak with a nurse about side effects from his Lisinopril.  Per 04/03/13 visit with Dr Graciela Husbands this medication was increased to 10mg  daily. The pt states that this medication makes him feel like "he has a mass in his head", "head feels full", headache and off balance. The pt said he did have similar symptoms when taking 5mg  daily but they are now more noticeable on higher dose.  At this time the pt will decrease Lisinopril back to 5mg  and if an alternative can be provided then the pt would like his medication changed.  Currently Dr Graciela Husbands is out of the office this week and I will forward this message to the pt's primary cardiologist Dr Clifton James to address.

## 2013-04-21 NOTE — Telephone Encounter (Signed)
Why don't we change to Cozaar 50 mg po Qdaily. Thanks, chris

## 2013-04-21 NOTE — Progress Notes (Signed)
Echocardiogram performed.  

## 2013-04-23 ENCOUNTER — Ambulatory Visit (HOSPITAL_BASED_OUTPATIENT_CLINIC_OR_DEPARTMENT_OTHER): Payer: Medicare Other | Attending: Internal Medicine | Admitting: Radiology

## 2013-04-23 VITALS — Ht 77.0 in | Wt 278.0 lb

## 2013-04-23 DIAGNOSIS — R259 Unspecified abnormal involuntary movements: Secondary | ICD-10-CM | POA: Insufficient documentation

## 2013-04-23 DIAGNOSIS — G4733 Obstructive sleep apnea (adult) (pediatric): Secondary | ICD-10-CM | POA: Insufficient documentation

## 2013-04-23 DIAGNOSIS — I4949 Other premature depolarization: Secondary | ICD-10-CM | POA: Insufficient documentation

## 2013-04-30 ENCOUNTER — Telehealth: Payer: Self-pay | Admitting: *Deleted

## 2013-04-30 DIAGNOSIS — G4733 Obstructive sleep apnea (adult) (pediatric): Secondary | ICD-10-CM

## 2013-04-30 DIAGNOSIS — G4761 Periodic limb movement disorder: Secondary | ICD-10-CM

## 2013-04-30 DIAGNOSIS — I4949 Other premature depolarization: Secondary | ICD-10-CM

## 2013-04-30 MED ORDER — LISINOPRIL 2.5 MG PO TABS: 2.5000 mg | ORAL_TABLET | Freq: Every day | ORAL | Status: DC

## 2013-04-30 NOTE — Procedures (Signed)
NAME:  Dennis Hill, Dennis Hill NO.:  [id number]  MEDICAL RECORD NO.:  [id number]          PATIENT TYPE:  OUT  LOCATION:  SLEEP CENTER                 FACILITY:  [facility]  PHYSICIAN:  Barbaraann Share, MD,FCCPDATE OF BIRTH:  03-08-1944  DATE OF STUDY:  04/23/2013                           NOCTURNAL POLYSOMNOGRAM  REFERRING PHYSICIAN:  Duke Salvia, MD, [facility]  INDICATION FOR STUDY:  Hypersomnia with sleep apnea.  EPWORTH SLEEPINESS SCORE:  6.  MEDICATIONS:  SLEEP ARCHITECTURE:  The patient had a total sleep time of 328 minutes with no slow-wave sleep and only 32 minutes of REM.  Sleep onset latency was normal at 2 minutes and REM onset was very prolonged at 225 minutes. Sleep efficiency was moderately decreased at 77%.  RESPIRATORY DATA:  The patient was found to have 23 obstructive and central apneas, as well as 51 obstructive hypopneas, giving him an apnea- hypopnea index of 14 events per hour.  The events occurred in all body positions, and there was moderate snoring noted throughout.  The patient did not meet split night protocol secondary to his small numbers of events.  OXYGEN DATA:  There was transient desaturation as low as 67% during his events with REM.  CARDIAC DATA:  PVCs were noted throughout.  MOVEMENT-PARASOMNIA:  The patient was found to have 635 leg jerks with 7 per hour resulting in arousal or awakening.  There were no abnormal behaviors noted.  IMPRESSIONS-RECOMMENDATIONS: 1. Mild obstructive sleep apnea/hypopnea syndrome, with an AHI of 14     events per hour and oxygen desaturation transiently as low as 67%     during REM.  Treatment for this degree of sleep apnea can include a     trial of weight loss, upper airway surgery, dental appliance, and     also CPAP.  Clinical correlation is suggested. 2. PVCs noted throughout the night. 3. 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  It is unclear how much  of this is associated     with his sleep-disordered breathing, or whether he may have a     concomitant primary     movement disorder of sleep.  My suspicion is the latter, given his     large numbers of limb movements.  Clinical correlation suggested.     Barbaraann Share, MD,FCCP Diplomate, American Board of Sleep Medicine    [facility]MED[facility]  D:  04/30/2013 08:50:13  T:  04/30/2013 13:37:41  Job:  [id number]

## 2013-04-30 NOTE — Telephone Encounter (Signed)
Spoke with pt, he is aware of echo results. He reports he cont to have dizziness on the losartan. He would like to go back to the lisinopril at a lower dose to see if he tolerates it better. Pt will take lisinopril 2.5 mg daily and increase as tolerated. He will check his bp and let us know how he is doing.

## 2013-06-23 ENCOUNTER — Ambulatory Visit: Payer: Medicare Other | Admitting: Internal Medicine

## 2013-06-24 ENCOUNTER — Ambulatory Visit: Payer: Medicare Other | Admitting: Internal Medicine

## 2013-07-08 ENCOUNTER — Ambulatory Visit (INDEPENDENT_AMBULATORY_CARE_PROVIDER_SITE_OTHER): Payer: Medicare Other | Admitting: Internal Medicine

## 2013-07-08 ENCOUNTER — Encounter: Payer: Self-pay | Admitting: Internal Medicine

## 2013-07-08 VITALS — BP 128/70 | HR 67 | Temp 97.0°F | Ht 77.0 in | Wt 279.5 lb

## 2013-07-08 DIAGNOSIS — E785 Hyperlipidemia, unspecified: Secondary | ICD-10-CM

## 2013-07-08 DIAGNOSIS — Z0000 Encounter for general adult medical examination without abnormal findings: Secondary | ICD-10-CM

## 2013-07-08 DIAGNOSIS — I428 Other cardiomyopathies: Secondary | ICD-10-CM

## 2013-07-08 DIAGNOSIS — I420 Dilated cardiomyopathy: Secondary | ICD-10-CM

## 2013-07-08 DIAGNOSIS — E119 Type 2 diabetes mellitus without complications: Secondary | ICD-10-CM

## 2013-07-08 NOTE — Assessment & Plan Note (Signed)
stable overall by history and exam, recent data reviewed with pt, and pt to continue medical treatment as before,  to f/u any worsening symptoms or concerns Lab Results  Component Value Date   LDLCALC 101* 12/09/2012   \

## 2013-07-08 NOTE — Assessment & Plan Note (Signed)
stable overall by history and exam, recent data reviewed with pt, and pt to continue medical treatment as before,  to f/u any worsening symptoms or concerns Lab Results  Component Value Date   HGBA1C 6.7* 12/10/2012

## 2013-07-08 NOTE — Progress Notes (Signed)
Subjective:    Patient ID: Dennis Hill, male    DOB: 1945/10/03, 67 y.o.   MRN: [id number]  HPI  Here to f/u; overall doing ok,  Pt denies chest pain, increased sob or doe, wheezing, orthopnea, PND, increased LE swelling, palpitations, dizziness or syncope.  Pt denies polydipsia, polyuria, or low sugar symptoms such as weakness or confusion improved with po intake.  Pt denies new neurological symptoms such as new headache, or facial or extremity weakness or numbness.   Pt states overall good compliance with meds, has been trying to follow lower cholesterol diet, with wt overall stable,  but little exercise however.  S/p ablation June 2014, has done well since then.  Pt denies fever, wt loss, night sweats, loss of appetite, or other constitutional symptoms.  Has bilat knee pain only with increased exercise over usual walking during the day.  Still essentially working full time at 4 days per wk, wants to retire later this yr.  Gets dizzy with any ACE more than current dosing Past Medical History  Diagnosis Date  . DIABETES MELLITUS, TYPE II 01/28/2010    diet controlled  . DEPRESSION 03/15/2009  . GERD 03/15/2009  . DIVERTICULOSIS, COLON 03/15/2009  . 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 FOOT PAIN, LEFT 04/05/2010  . RUQ PAIN 01/28/2010  . Hx of adenomatous colonic polyps 03/15/2009  . NEPHROLITHIASIS, HX OF 01/28/2010  . Anxiety   . Chronic insomnia   . Fatty liver   . Internal hemorrhoids   . Hyperlipidemia 02/10/2011  . Atrial bigeminy 2008    Dr. Graciela Husbands  . Atrial fibrillation   . Atrial flutter   . Pneumonia ~ 1950  . Exertional shortness of breath     "lately" (03/06/2013)  . Sleep apnea   . WUJWJXBJ(478.2)     "monthly" (03/06/2013)  . 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 BPH (benign prostatic hypertrophy) 03/21/2013   Past Surgical History  Procedure Laterality Date  . Appendectomy    . Cholecystectomy  2006  . Tonsillectomy and adenoidectomy  ~ 1952  . Inguinal hernia repair  Right 1986  . Cardioversion N/A 02/06/2013    Procedure: CARDIOVERSION;  Surgeon: Marinus Maw, MD;  Location: [facility] ENDOSCOPY;  Service: Cardiovascular;  Laterality: N/A;  . Tee without cardioversion N/A 03/05/2013    Procedure: TRANSESOPHAGEAL ECHOCARDIOGRAM (TEE);  Surgeon: Laurey Morale, MD;  Location: [facility] ENDOSCOPY;  Service: Cardiovascular;  Laterality: N/A;  Elita Quick Fawn Kirk  . Atrial flutter ablation  03/06/2013    reports that he quit smoking about 33 years ago. His smoking use included Cigarettes. He has a 13 pack-year smoking history. He has never used smokeless tobacco. He reports that he drinks about 0.6 ounces of alcohol per week. He reports that he does not use illicit drugs. family history includes Colon cancer in his mother; Lung cancer (age of onset: 75) in his mother. There is no history of CAD. No Known Allergies Current Outpatient Prescriptions on File Prior to Visit  Medication Sig Dispense Refill  . acetaminophen (TYLENOL) 500 MG tablet Take 500 mg by mouth every 6 (six) hours as needed for pain.      Marland Kitchen esomeprazole (NEXIUM) 40 MG capsule Take 40 mg by mouth daily as needed (for stomach acid).      . fexofenadine (ALLEGRA) 180 MG tablet Take 1 tablet (180 mg total) by mouth daily.  30 tablet  2  . fluticasone (FLONASE) 50 MCG/ACT nasal spray Place 2 sprays into the nose daily.  16 g  2  . lisinopril (PRINIVIL,ZESTRIL) 2.5 MG tablet Take 1 tablet (2.5 mg total) by mouth daily.  30 tablet  12  . tamsulosin (FLOMAX) 0.4 MG CAPS Take 1 capsule (0.4 mg total) by mouth daily.  90 capsule  3   No current facility-administered medications on file prior to visit.    Review of Systems  Constitutional: Negative for unexpected weight change, or unusual diaphoresis  HENT: Negative for tinnitus.   Eyes: Negative for photophobia and visual disturbance.  Respiratory: Negative for choking and stridor.   Gastrointestinal: Negative for vomiting and blood in stool.  Genitourinary: Negative for  hematuria and decreased urine volume.  Musculoskeletal: Negative for acute joint swelling Skin: Negative for color change and wound.  Neurological: Negative for tremors and numbness other than noted  Psychiatric/Behavioral: Negative for decreased concentration or  hyperactivity.       Objective:   Physical Exam BP 128/70  Pulse 67  Temp(Src) 97 F (36.1 C) (Oral)  Ht 6\' 5"  (1.956 m)  Wt 279 lb 8 oz (126.78 kg)  BMI 33.14 kg/m2  SpO2 95% VS noted,  Constitutional: Pt appears well-developed and well-nourished.  HENT: Head: NCAT.  Right Ear: External ear normal.  Left Ear: External ear normal.  Eyes: Conjunctivae and EOM are normal. Pupils are equal, round, and reactive to light.  Neck: Normal range of motion. Neck supple.  Cardiovascular: Normal rate and regular rhythm.   Pulmonary/Chest: Effort normal and breath sounds normal.  Abd:  Soft, NT, non-distended, + BS Neurological: Pt is alert. Not confused  Skin: Skin is warm. No erythema.  Psychiatric: Pt behavior is normal. Thought content normal.  Bilat knee crepitus    Assessment & Plan:

## 2013-07-08 NOTE — Assessment & Plan Note (Signed)
stable overall by history and exam, recent data reviewed with pt, and pt to continue medical treatment as before,  to f/u any worsening symptoms or concerns Lab Results  Component Value Date   WBC 7.6 03/04/2013   HGB 14.8 03/04/2013   HCT 44.6 03/04/2013   PLT 219.0 03/04/2013   GLUCOSE 109* 04/21/2013   CHOL 145 12/09/2012   TRIG 76.0 12/09/2012   HDL 29.30* 12/09/2012   LDLCALC 101* 12/09/2012   ALT 24 02/22/2013   AST 22 02/22/2013   NA 140 04/21/2013   K 4.4 04/21/2013   CL 106 04/21/2013   CREATININE 1.3 04/21/2013   BUN 17 04/21/2013   CO2 29 04/21/2013   TSH 2.21 12/09/2012   PSA 3.40 12/09/2012   INR 1.1* 03/04/2013   HGBA1C 6.7* 12/10/2012   MICROALBUR 0.5 01/28/2010

## 2013-07-08 NOTE — Patient Instructions (Addendum)
Please consider an elliptical machine or stationary bike for "non weight bearing" excercises, or swimming Please continue all other medications as before, and refills have been done if requested. Please have the pharmacy call with any other refills you may need. Please go to the LAB in the Basement (turn left off the elevator) for the tests to be done today You will be contacted by phone if any changes need to be made immediately.  Otherwise, you will receive a letter about your results with an explanation, but please check with MyChart first.  Please remember to sign up for My Chart if you have not done so, as this will be important to you in the future with finding out test results, communicating by private email, and scheduling acute appointments online when needed.  Please return in 6 months, or sooner if needed, with Lab testing done 3-5 days before

## 2013-07-24 ENCOUNTER — Other Ambulatory Visit: Payer: Self-pay

## 2013-08-12 ENCOUNTER — Ambulatory Visit (INDEPENDENT_AMBULATORY_CARE_PROVIDER_SITE_OTHER): Payer: Medicare Other | Admitting: Family Medicine

## 2013-08-12 ENCOUNTER — Other Ambulatory Visit (INDEPENDENT_AMBULATORY_CARE_PROVIDER_SITE_OTHER): Payer: Medicare Other

## 2013-08-12 ENCOUNTER — Encounter: Payer: Self-pay | Admitting: Family Medicine

## 2013-08-12 ENCOUNTER — Ambulatory Visit (INDEPENDENT_AMBULATORY_CARE_PROVIDER_SITE_OTHER)
Admission: RE | Admit: 2013-08-12 | Discharge: 2013-08-12 | Disposition: A | Discharge status: Home / Self Care | Payer: Medicare Other | Source: Ambulatory Visit | Attending: Family Medicine | Admitting: Family Medicine

## 2013-08-12 VITALS — BP 140/86 | HR 68 | Wt 286.0 lb

## 2013-08-12 DIAGNOSIS — M25562 Pain in left knee: Secondary | ICD-10-CM

## 2013-08-12 DIAGNOSIS — M23302 Other meniscus derangements, unspecified lateral meniscus, unspecified knee: Secondary | ICD-10-CM

## 2013-08-12 DIAGNOSIS — M1712 Unilateral primary osteoarthritis, left knee: Secondary | ICD-10-CM | POA: Insufficient documentation

## 2013-08-12 DIAGNOSIS — M25569 Pain in unspecified knee: Secondary | ICD-10-CM

## 2013-08-12 DIAGNOSIS — M23209 Derangement of unspecified meniscus due to old tear or injury, unspecified knee: Secondary | ICD-10-CM | POA: Insufficient documentation

## 2013-08-12 DIAGNOSIS — M1710 Unilateral primary osteoarthritis, unspecified knee: Secondary | ICD-10-CM

## 2013-08-12 DIAGNOSIS — M23201 Derangement of unspecified lateral meniscus due to old tear or injury, left knee: Secondary | ICD-10-CM

## 2013-08-12 MED ORDER — MELOXICAM 15 MG PO TABS: 15.0000 mg | ORAL_TABLET | Freq: Every day | ORAL | Status: DC

## 2013-08-12 NOTE — Progress Notes (Signed)
Pre-visit discussion using our clinic review tool. No additional management support is needed unless otherwise documented below in the visit note.  

## 2013-08-12 NOTE — Assessment & Plan Note (Signed)
Brace given today. Fitted by me. Patient will do home exercises, meloxicam daily for 10 days, as well as icing. Patient will try these interventions and an come back again in 4 weeks. At that time if he continues to have pain I would do a corticosteroid injection.

## 2013-08-12 NOTE — Progress Notes (Signed)
I'm seeing this patient by the request  of:  Oliver Barre, MD  CC: Knee pain  HPI: Patient is a pleasant 67 year old gentleman coming in with knee pain. Patient states that the pain seems to be bilateral. Patient states there is worse with activity. Patient states at the end of the day he has more pain. Patient describes as more of a dull aching sensation. Patient states unfortunately of the course last 2 weeks it seems to be getting worse the left side. Patient states that is has a sharp pain with certain movements especially with angulation for a long amount of time. Patient also feels that his knee has almost given him up him previously. Patient denies any clicking or popping or any radiation of the pain. Patient also denies any numbness or any related back pain. Patient has noticed he is starting to augment his regular activities secondary to the pain. Patient has tried some over-the-counter medications with minimal benefit. Patient is a severity a 7/10.   Past medical, surgical, family and social history reviewed. Medications reviewed all in the electronic medical record.   Review of Systems: No headache, visual changes, nausea, vomiting, diarrhea, constipation, dizziness, abdominal pain, skin rash, fevers, chills, night sweats, weight loss, swollen lymph nodes, body aches, joint swelling, muscle aches, chest pain, shortness of breath, mood changes.   Objective:    Blood pressure 140/86, pulse 68, weight 286 lb (129.729 kg), SpO2 96.00%.   General: No apparent distress alert and oriented x3 mood and affect normal, dressed appropriately.  HEENT: Pupils equal, extraocular movements intact Respiratory: Patient's speak in full sentences and does not appear short of breath Cardiovascular: No lower extremity edema, non tender, no erythema Skin: Warm dry intact with no signs of infection or rash on extremities or on axial skeleton. Abdomen: Soft nontender Neuro: Cranial nerves II through XII are  intact, neurovascularly intact in all extremities with 2+ DTRs and 2+ pulses. Lymph: No lymphadenopathy of posterior or anterior cervical chain or axillae bilaterally.  Gait normal with good balance and coordination.  MSK: Non tender with full range of motion and good stability and symmetric strength and tone of shoulders, elbows, wrist, hip, and ankles bilaterally.  Knee: Left Inspection shows the patient does have osteophytic changes of the knees bilaterally left greater than right Patient does have tenderness to palpation over the medial joint line. ROM full in flexion and extension and lower leg rotation. Ligaments with solid consistent endpoints including ACL, PCL, LCL, MCL. Positive Mcmurray's, Apley's, and Thessalonian tests. Painful patellar compression. Patellar glide with significant crepitus. Patellar and quadriceps tendons unremarkable. Hamstring and quadriceps strength is normal.   X-rays were ordered reviewed and interpreted by me today. X-rays bilateral standing tissues patient does have severe osteophytic changes in the medial compartments bilaterally left greater than right  MSK US performed of: Left knee This study was ordered, performed, and interpreted by Terrilee Files D.O.  Knee: All structures visualized. Patient does have what appears to be a displaced anterior medial meniscus without any true tear. In addition to this patient has a acute on chronic tear of the posterior medial as well as medial midpole position of the meniscus. This does cause some mild displacement. Patient also at its found to have severe osteophytic changes of the joint space bilaterally. Patellar Tendon unremarkable on long and transverse views without effusion. No abnormality of prepatellar bursa. LCL and MCL unremarkable on long and transverse views. No abnormality of origin of medial or lateral head of the  gastrocnemius.  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   Impression and Recommendations:     This case required medical decision making of moderate complexity.

## 2013-08-12 NOTE — Assessment & Plan Note (Signed)
Patient likely has moderate to severe arthritis changes.  We will get x-rays from baseline. We discussed some over-the-counter medications but will need a list later. Home exercise program was given and discussed weight loss.

## 2013-08-12 NOTE — Patient Instructions (Addendum)
Very nice to meet you Wear the brace with activity. Ice 20 minutes 2 times a day Try elliptical, biking or swimming.  If walking wear good tennis shoes.  Meloxicam daily for 10 days then as needed.  Exercises daily.  Come back and see me again in 4 weeks.

## 2013-08-20 ENCOUNTER — Ambulatory Visit (INDEPENDENT_AMBULATORY_CARE_PROVIDER_SITE_OTHER): Payer: Medicare Other | Admitting: Internal Medicine

## 2013-08-20 ENCOUNTER — Encounter: Payer: Self-pay | Admitting: Internal Medicine

## 2013-08-20 VITALS — BP 122/82 | HR 87 | Temp 98.6°F | Ht 77.0 in | Wt 284.4 lb

## 2013-08-20 DIAGNOSIS — T783XXA Angioneurotic edema, initial encounter: Secondary | ICD-10-CM

## 2013-08-20 DIAGNOSIS — I872 Venous insufficiency (chronic) (peripheral): Secondary | ICD-10-CM | POA: Insufficient documentation

## 2013-08-20 DIAGNOSIS — R609 Edema, unspecified: Secondary | ICD-10-CM

## 2013-08-20 DIAGNOSIS — M1710 Unilateral primary osteoarthritis, unspecified knee: Secondary | ICD-10-CM

## 2013-08-20 DIAGNOSIS — M1712 Unilateral primary osteoarthritis, left knee: Secondary | ICD-10-CM

## 2013-08-20 MED ORDER — PREDNISONE 10 MG PO TABS: ORAL_TABLET | ORAL | Status: DC

## 2013-08-20 NOTE — Patient Instructions (Signed)
Please take all new medication as prescribed - the prednisone OK to stop the Lisinopril, and the meloxicam Please continue all other medications as before Please have the pharmacy call with any other refills you may need.  Please go to the LAB in the Basement (turn left off the elevator) for the tests to be done at your convenience  Please remember to sign up for My Chart if you have not done so, as this will be important to you in the future with finding out test results, communicating by private email, and scheduling acute appointments online when needed.

## 2013-08-20 NOTE — Progress Notes (Signed)
Subjective:    Patient ID: Dennis Hill, male    DOB: 11/16/1945, 67 y.o.   MRN: [id number]  HPI  Here with acute on chronic LLE swelling, mild but hard to put on shoe ths am, worse  Without pain for 5 days; has known mild CKD and LE varicosities, and also recent left knee effusion with pain better after NSAID use; also incidentally with tongue thickness and talking funny for 5 days despite stopping the ACE 2 days ago.  No rash, but has felt some wheezing yesterday, not today. No sob, other swelling.   Pt denies fever, wt loss, night sweats, loss of appetite, or other constitutional symptoms Past Medical History  Diagnosis Date  . DIABETES MELLITUS, TYPE II 01/28/2010    diet controlled  . DEPRESSION 03/15/2009  . GERD 03/15/2009  . DIVERTICULOSIS, COLON 03/15/2009  . 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 FOOT PAIN, LEFT 04/05/2010  . RUQ PAIN 01/28/2010  . Hx of adenomatous colonic polyps 03/15/2009  . NEPHROLITHIASIS, HX OF 01/28/2010  . Anxiety   . Chronic insomnia   . Fatty liver   . Internal hemorrhoids   . Hyperlipidemia 02/10/2011  . Atrial bigeminy 2008    Dr. Graciela Husbands  . Atrial fibrillation   . Atrial flutter   . Pneumonia ~ 1950  . Exertional shortness of breath     "lately" (03/06/2013)  . Sleep apnea   . XBMWUXLK(440.1)     "monthly" (03/06/2013)  . 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 BPH (benign prostatic hypertrophy) 03/21/2013   Past Surgical History  Procedure Laterality Date  . Appendectomy    . Cholecystectomy  2006  . Tonsillectomy and adenoidectomy  ~ 1952  . Inguinal hernia repair Right 1986  . Cardioversion N/A 02/06/2013    Procedure: CARDIOVERSION;  Surgeon: Marinus Maw, MD;  Location: [facility] ENDOSCOPY;  Service: Cardiovascular;  Laterality: N/A;  . Tee without cardioversion N/A 03/05/2013    Procedure: TRANSESOPHAGEAL ECHOCARDIOGRAM (TEE);  Surgeon: Laurey Morale, MD;  Location: [facility] ENDOSCOPY;  Service: Cardiovascular;  Laterality: N/A;  Elita Quick Fawn Kirk  . Atrial  flutter ablation  03/06/2013    reports that he quit smoking about 33 years ago. His smoking use included Cigarettes. He has a 13 pack-year smoking history. He has never used smokeless tobacco. He reports that he drinks about 0.6 ounces of alcohol per week. He reports that he does not use illicit drugs. family history includes Colon cancer in his mother; Lung cancer (age of onset: 50) in his mother. There is no history of CAD. Allergies  Allergen Reactions  . Lisinopril Other (See Comments)    Tongue swelling   Current Outpatient Prescriptions on File Prior to Visit  Medication Sig Dispense Refill  . acetaminophen (TYLENOL) 500 MG tablet Take 500 mg by mouth every 6 (six) hours as needed for pain.      Marland Kitchen esomeprazole (NEXIUM) 40 MG capsule Take 40 mg by mouth daily as needed (for stomach acid).      . fexofenadine (ALLEGRA) 180 MG tablet Take 1 tablet (180 mg total) by mouth daily.  30 tablet  2  . fluticasone (FLONASE) 50 MCG/ACT nasal spray Place 2 sprays into the nose daily.  16 g  2  . tamsulosin (FLOMAX) 0.4 MG CAPS Take 1 capsule (0.4 mg total) by mouth daily.  90 capsule  3   No current facility-administered medications on file prior to visit.    Review of Systems  Constitutional: Negative for unexpected weight change,  or unusual diaphoresis  HENT: Negative for tinnitus.   Eyes: Negative for photophobia and visual disturbance.  Respiratory: Negative for choking and stridor.   Gastrointestinal: Negative for vomiting and blood in stool.  Genitourinary: Negative for hematuria and decreased urine volume.  Musculoskeletal: Negative for acute joint swelling Skin: Negative for color change and wound.  Neurological: Negative for tremors and numbness other than noted  Psychiatric/Behavioral: Negative for decreased concentration or  hyperactivity.       Objective:   Physical Exam BP 122/82  Pulse 87  Temp(Src) 98.6 F (37 C) (Oral)  Ht 6\' 5"  (1.956 m)  Wt 284 lb 6 oz (128.992 kg)   BMI 33.72 kg/m2  SpO2 94% VS noted,  Constitutional: Pt appears well-developed and well-nourished.  HENT: Head: NCAT.  Right Ear: External ear normal.  Left Ear: External ear normal.  ? Mild tongue swelling nontender, pharynx benign Eyes: Conjunctivae and EOM are normal. Pupils are equal, round, and reactive to light.  Neck: Normal range of motion. Neck supple.  Cardiovascular: Normal rate and regular rhythm.   Pulmonary/Chest: Effort normal and breath sounds decreased with no wheezes.  Abd:  Soft, NT, non-distended, + BS Neurological: Pt is alert. Not confused  Skin: Skin is warm. No erythema. Has mult varicosities to LE with 1+ edema LLE above ankle, also trace right ankle edema Psychiatric: Pt behavior is normal. Thought content normal. ? Mild depressed affect Left knee with crepitus, 2+ effusion    Assessment & Plan:

## 2013-08-20 NOTE — Assessment & Plan Note (Signed)
Mild to mod, for benadryl 50 mg po prn, also predpack asd,,  to f/u any worsening symptoms or concerns

## 2013-08-20 NOTE — Progress Notes (Signed)
Pre-visit discussion using our clinic review tool. No additional management support is needed unless otherwise documented below in the visit note.  

## 2013-08-20 NOTE — Assessment & Plan Note (Addendum)
Left > right, likely multifactorial, including nsaid use, CKD, venous stasis, and cardiac related, for low salt, leg elevation, current meds  Note:  Total time for pt hx, exam, review of record with pt in the room, determination of diagnoses and plan for further eval and tx is > 40 min, with over 50% spent in coordination and counseling of patient

## 2013-08-20 NOTE — Assessment & Plan Note (Signed)
Ok to use tylenol prn, avoid mobic for now with recent worsening LE edema

## 2013-09-05 ENCOUNTER — Other Ambulatory Visit (INDEPENDENT_AMBULATORY_CARE_PROVIDER_SITE_OTHER): Payer: Medicare Other

## 2013-09-05 DIAGNOSIS — E119 Type 2 diabetes mellitus without complications: Secondary | ICD-10-CM

## 2013-09-05 LAB — BASIC METABOLIC PANEL
CO2: 29 mEq/L (ref 19–32)
Chloride: 107 mEq/L (ref 96–112)
Creatinine, Ser: 1.1 mg/dL (ref 0.4–1.5)
Sodium: 140 mEq/L (ref 135–145)

## 2013-09-05 LAB — HEPATIC FUNCTION PANEL
ALT: 23 U/L (ref 0–53)
Alkaline Phosphatase: 80 U/L (ref 39–117)
Bilirubin, Direct: 0.1 mg/dL (ref 0.0–0.3)
Total Protein: 6.5 g/dL (ref 6.0–8.3)

## 2013-09-05 LAB — HEMOGLOBIN A1C: Hgb A1c MFr Bld: 6.8 % — ABNORMAL HIGH (ref 4.6–6.5)

## 2013-09-05 LAB — LDL CHOLESTEROL, DIRECT: Direct LDL: 87.2 mg/dL

## 2013-09-05 LAB — LIPID PANEL
Cholesterol: 135 mg/dL (ref 0–200)
Total CHOL/HDL Ratio: 5

## 2013-09-15 ENCOUNTER — Ambulatory Visit (INDEPENDENT_AMBULATORY_CARE_PROVIDER_SITE_OTHER): Payer: Medicare Other | Admitting: Family Medicine

## 2013-09-15 ENCOUNTER — Encounter: Payer: Self-pay | Admitting: Family Medicine

## 2013-09-15 VITALS — BP 122/78 | HR 73

## 2013-09-15 DIAGNOSIS — M1710 Unilateral primary osteoarthritis, unspecified knee: Secondary | ICD-10-CM | POA: Insufficient documentation

## 2013-09-15 DIAGNOSIS — M1712 Unilateral primary osteoarthritis, left knee: Secondary | ICD-10-CM

## 2013-09-15 DIAGNOSIS — M25569 Pain in unspecified knee: Secondary | ICD-10-CM

## 2013-09-15 DIAGNOSIS — M25562 Pain in left knee: Secondary | ICD-10-CM

## 2013-09-15 NOTE — Progress Notes (Signed)
Pre-visit discussion using our clinic review tool. No additional management support is needed unless otherwise documented below in the visit note.  

## 2013-09-15 NOTE — Progress Notes (Signed)
  I'm seeing this patient by the request  of:  Oliver Barre, MD  CC: Knee pain follow up  HPI: Patient is a 67 year old gentleman coming in for followup of his osteoarthritis of the left knee. Patient was given Mobic but did have a allergic reaction and now is only taking Tylenol. 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 Patient was given home exercise program, discussed different crosstraining methods that could be more beneficial. Patient states he is doing significantly better with the meloxicam. Unfortunately now the pain has returned to get to stop. Patient is doing the exercises fairly recently. Patient still having pain mostly on the medial aspect of the knee and states that his contralateral knee seems to be worsening because he is compensating. States that the swelling is better. No new symptoms other than the contralateral knee.  Past medical, surgical, family and social history reviewed. Medications reviewed all in the electronic medical record.   Review of Systems: No headache, visual changes, nausea, vomiting, diarrhea, constipation, dizziness, abdominal pain, skin rash, fevers, chills, night sweats, weight loss, swollen lymph nodes, body aches, joint swelling, muscle aches, chest pain, shortness of breath, mood changes.   Objective:    Blood pressure 122/78, pulse 73, SpO2 94.00%.   General: No apparent distress alert and oriented x3 mood and affect normal, dressed appropriately.  HEENT: Pupils equal, extraocular movements intact Respiratory: Patient's speak in full sentences and does not appear short of breath Cardiovascular: No lower extremity edema, non tender, no erythema Skin: Warm dry intact with no signs of infection or rash on extremities or on axial skeleton. Abdomen: Soft nontender Neuro: Cranial nerves II through XII are intact, neurovascularly intact in all extremities with 2+ DTRs and 2+ pulses. Lymph: No lymphadenopathy of posterior or  anterior cervical chain or axillae bilaterally.  Gait normal with good balance and coordination.  MSK: Non tender with full range of motion and good stability and symmetric strength and tone of shoulders, elbows, wrist, hip, and ankles bilaterally.  Knee: Left Inspection shows the patient does have osteoarthritic changes of the knees bilaterally left greater than right Patient does have tenderness to palpation over the medial joint line. ROM full in flexion and extension and lower leg rotation. Ligaments with solid consistent endpoints including ACL, PCL, LCL, MCL. Positive Mcmurray's, Apley's, and Thessalonian tests. Painful patellar compression. Patellar glide with significant crepitus. Patellar and quadriceps tendons unremarkable. Hamstring and quadriceps strength is normal.   After informed written and verbal consent, patient was seated on exam table. Left knee was prepped with alcohol swab and utilizing anterolateral approach, patient's left knee space was injected with 4:1  marcaine 0.5%: Kenalog 40mg /dL. Patient tolerated the procedure well without immediate complications.  Impression and Recommendations:     This case required medical decision making of moderate complexity.

## 2013-09-15 NOTE — Assessment & Plan Note (Signed)
Patient had steroid injection today and tolerated the procedure well with good relief. Patient does have an acute on chronic medial meniscal tear. Patient will try this as well as going to formal physical therapy. He'll alert home exercise program Discuss continued over-the-counter medications as well as icing Patient will come back in 4 weeks. If he continues to have pain he would be a perfect candidate for viscous supplementation.

## 2013-09-15 NOTE — Patient Instructions (Signed)
Good to see you and Happy New Year! Continue the exercises.  Lets go to physical therapy 1 time to make sure we are doing the exercises correctly. They will call you You do have arthritis and would continue the tylenol, vitamin D, fish oil for sure.  Come back again in 3 weeks and make sure you doing well.

## 2013-10-03 ENCOUNTER — Ambulatory Visit: Payer: Medicare Other | Attending: Internal Medicine | Admitting: Physical Therapy

## 2013-10-03 DIAGNOSIS — M25569 Pain in unspecified knee: Secondary | ICD-10-CM | POA: Insufficient documentation

## 2013-10-03 DIAGNOSIS — IMO0001 Reserved for inherently not codable concepts without codable children: Secondary | ICD-10-CM | POA: Insufficient documentation

## 2013-10-03 DIAGNOSIS — J45909 Unspecified asthma, uncomplicated: Secondary | ICD-10-CM | POA: Insufficient documentation

## 2013-10-10 ENCOUNTER — Ambulatory Visit: Payer: Medicare Other | Admitting: Physical Therapy

## 2013-10-15 ENCOUNTER — Other Ambulatory Visit (INDEPENDENT_AMBULATORY_CARE_PROVIDER_SITE_OTHER): Payer: Medicare Other

## 2013-10-15 ENCOUNTER — Ambulatory Visit (INDEPENDENT_AMBULATORY_CARE_PROVIDER_SITE_OTHER): Payer: Medicare Other | Admitting: Internal Medicine

## 2013-10-15 ENCOUNTER — Encounter: Payer: Self-pay | Admitting: Internal Medicine

## 2013-10-15 VITALS — BP 108/72 | HR 77 | Temp 98.9°F | Ht 77.0 in | Wt 279.5 lb

## 2013-10-15 DIAGNOSIS — R040 Epistaxis: Secondary | ICD-10-CM

## 2013-10-15 DIAGNOSIS — E119 Type 2 diabetes mellitus without complications: Secondary | ICD-10-CM

## 2013-10-15 DIAGNOSIS — E785 Hyperlipidemia, unspecified: Secondary | ICD-10-CM

## 2013-10-15 DIAGNOSIS — J309 Allergic rhinitis, unspecified: Secondary | ICD-10-CM

## 2013-10-15 LAB — CBC WITH DIFFERENTIAL/PLATELET
BASOS PCT: 1 % (ref 0.0–3.0)
Basophils Absolute: 0.1 10*3/uL (ref 0.0–0.1)
EOS PCT: 2.1 % (ref 0.0–5.0)
Eosinophils Absolute: 0.2 10*3/uL (ref 0.0–0.7)
HCT: 47.1 % (ref 39.0–52.0)
Hemoglobin: 15.2 g/dL (ref 13.0–17.0)
LYMPHS PCT: 27.8 % (ref 12.0–46.0)
Lymphs Abs: 2.5 10*3/uL (ref 0.7–4.0)
MCHC: 32.3 g/dL (ref 30.0–36.0)
MCV: 88.8 fl (ref 78.0–100.0)
MONOS PCT: 10.3 % (ref 3.0–12.0)
Monocytes Absolute: 0.9 10*3/uL (ref 0.1–1.0)
NEUTROS PCT: 58.8 % (ref 43.0–77.0)
Neutro Abs: 5.2 10*3/uL (ref 1.4–7.7)
Platelets: 216 10*3/uL (ref 150.0–400.0)
RBC: 5.31 Mil/uL (ref 4.22–5.81)
RDW: 14.1 % (ref 11.5–14.6)
WBC: 8.9 10*3/uL (ref 4.5–10.5)

## 2013-10-15 LAB — BASIC METABOLIC PANEL
BUN: 23 mg/dL (ref 6–23)
CALCIUM: 10.3 mg/dL (ref 8.4–10.5)
CHLORIDE: 106 meq/L (ref 96–112)
CO2: 30 meq/L (ref 19–32)
CREATININE: 1.2 mg/dL (ref 0.4–1.5)
GFR: 62.48 mL/min (ref >60.00)
Glucose, Bld: 108 mg/dL — ABNORMAL HIGH (ref 70–99)
POTASSIUM: 4.9 meq/L (ref 3.5–5.1)
Sodium: 140 mEq/L (ref 135–145)

## 2013-10-15 LAB — PROTIME-INR
INR: 1.1 ratio — ABNORMAL HIGH (ref 0.8–1.0)
Prothrombin Time: 11.2 s (ref 10.2–12.4)

## 2013-10-15 MED ORDER — ASPIRIN EC 81 MG PO TBEC: 81.0000 mg | DELAYED_RELEASE_TABLET | Freq: Every day | ORAL | Status: DC

## 2013-10-15 NOTE — Assessment & Plan Note (Signed)
stable overall by history and exam, recent data reviewed with pt, and pt to continue medical treatment as before,  to f/u any worsening symptoms or concerns Lab Results  Component Value Date   HGBA1C 6.8* 09/05/2013

## 2013-10-15 NOTE — Progress Notes (Signed)
Pre-visit discussion using our clinic review tool. No additional management support is needed unless otherwise documented below in the visit note.  

## 2013-10-15 NOTE — Progress Notes (Signed)
Subjective:    Patient ID: Dennis Hill, male    DOB: 1946/03/20, 68 y.o.   MRN: [id number]  HPI  Here with recurrent daily left nosebleed for 4 days, mostly anterior it seems with bleeding, can tend to drain post with pinching;  Today 2 bleeds, each taking at least 20 min pressure.  Off eliquis for approx 5 mo, and not taking asa at this time. Has not really been taking the flonase, only twice in the past mo.  Estimates 1/4 cup today blood loss, maybe 2-3 cups overall.  Pt denies chest pain, increased sob or doe, wheezing, orthopnea, PND, increased LE swelling, palpitations, syncope but does have some off balance possible with standing and walking "like wind blowing around in the brain."   Pt denies polydipsia, polyuria,  Past Medical History  Diagnosis Date  . DIABETES MELLITUS, TYPE II 01/28/2010    diet controlled  . DEPRESSION 03/15/2009  . GERD 03/15/2009  . DIVERTICULOSIS, COLON 03/15/2009  . 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 FOOT PAIN, LEFT 04/05/2010  . RUQ PAIN 01/28/2010  . Hx of adenomatous colonic polyps 03/15/2009  . NEPHROLITHIASIS, HX OF 01/28/2010  . Anxiety   . Chronic insomnia   . Fatty liver   . Internal hemorrhoids   . Hyperlipidemia 02/10/2011  . Atrial bigeminy 2008    Dr. Caryl Comes  . Atrial fibrillation   . Atrial flutter   . Pneumonia ~ 1950  . Exertional shortness of breath     "lately" (03/06/2013)  . Sleep apnea   . JOACZYSA(630.1)     "monthly" (03/06/2013)  . 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 BPH (benign prostatic hypertrophy) 03/21/2013   Past Surgical History  Procedure Laterality Date  . Appendectomy    . Cholecystectomy  2006  . Tonsillectomy and adenoidectomy  ~ 1952  . Inguinal hernia repair Right 1986  . Cardioversion N/A 02/06/2013    Procedure: CARDIOVERSION;  Surgeon: Evans Lance, MD;  Location: [place];  Service: Cardiovascular;  Laterality: N/A;  . Tee without cardioversion N/A 03/05/2013    Procedure: TRANSESOPHAGEAL  ECHOCARDIOGRAM (TEE);  Surgeon: Larey Dresser, MD;  Location: [place];  Service: Cardiovascular;  Laterality: N/A;  Jeannene Patella Greggory Brandy  . Atrial flutter ablation  03/06/2013    reports that he quit smoking about 33 years ago. His smoking use included Cigarettes. He has a 13 pack-year smoking history. He has never used smokeless tobacco. He reports that he drinks about 0.6 ounces of alcohol per week. He reports that he does not use illicit drugs. family history includes Colon cancer in his mother; Lung cancer (age of onset: 30) in his mother. There is no history of CAD. Allergies  Allergen Reactions  . Lisinopril Other (See Comments)    Tongue swelling   Current Outpatient Prescriptions on File Prior to Visit  Medication Sig Dispense Refill  . acetaminophen (TYLENOL) 500 MG tablet Take 500 mg by mouth every 6 (six) hours as needed for pain.      Marland Kitchen esomeprazole (NEXIUM) 40 MG capsule Take 40 mg by mouth daily as needed (for stomach acid).      . tamsulosin (FLOMAX) 0.4 MG CAPS Take 1 capsule (0.4 mg total) by mouth daily.  90 capsule  3   No current facility-administered medications on file prior to visit.   Review of Systems  Constitutional: Negative for unexpected weight change, or unusual diaphoresis  HENT: Negative for tinnitus.   Eyes: Negative for photophobia and visual disturbance.  Respiratory: Negative  for choking and stridor.   Gastrointestinal: Negative for vomiting and blood in stool.  Genitourinary: Negative for hematuria and decreased urine volume.  Musculoskeletal: Negative for acute joint swelling Skin: Negative for color change and wound.  Neurological: Negative for tremors and numbness other than noted  Psychiatric/Behavioral: Negative for decreased concentration or  hyperactivity.       Objective:   Physical Exam BP 108/72  Pulse 77  Temp(Src) 98.9 F (37.2 C) (Oral)  Ht 6\' 5"  (1.956 m)  Wt 279 lb 8 oz (126.78 kg)  BMI 33.14 kg/m2  SpO2 92% VS noted, + mild  orthostatic - see documentation extended vitals, had little dizziness with standing with this Constitutional: Pt appears well-developed and well-nourished.  HENT: Head: NCAT.  Right Ear: External ear normal.  Left Ear: External ear normal.  Eyes: Conjunctivae and EOM are normal. Pupils are equal, round, and reactive to light.  Neck: Normal range of motion. Neck supple.  Some dried blood about the left nares, o/w nasal passages wtihout red, swelling, blood or other drainage; no obvious mass or sinus tenderness Cardiovascular: Normal rate and regular rhythm.   Pulmonary/Chest: Effort normal and breath sounds normal.  Abd:  Soft, NT, non-distended, + BS Neurological: Pt is alert. Not confused  Skin: Skin is warm. No erythema.  Psychiatric: Pt behavior is normal. Thought content normal.      Assessment & Plan:

## 2013-10-15 NOTE — Patient Instructions (Addendum)
Your Blood pressures did tend to fall with standing today, which could indicate a bit more blood loss than we suspected from your history  Please stop the flonase for now  Please go to the LAB in the Basement (turn left off the elevator) for the tests to be done today - the cbc Please drink more fluids in the next few days  Please continue all other medications as before, and refills have been done if requested.  You will be contacted regarding the referral for: ENT - to see [facility] now  You should start Aspirin 81 mg at the point where bleeding from the nose is no longer likely, after treatment per ENT

## 2013-10-15 NOTE — Assessment & Plan Note (Signed)
As above, to start asa 81 mg, now s/p a flutter ablation, no longer on eliquis

## 2013-10-15 NOTE — Assessment & Plan Note (Addendum)
With mild orthostasis on exam, mild dizziness, not on anticoag or antiplt at this time, not currently bleeding, I think not necessary for hosp or ER eval at this time,  To encourage drink fluids, monitor for further blood loss, check cbc/labs now to r/o significant anemia, hold flonase though this appears to not be a major factor today, and refer to ENT today if possible for further tx, I suspect would likely benefit from cautery to left anterior nasal plexus, cont all other meds, and in fact should start ASA 81 mg when current episode has resolved for general CV prophlyaxis  Note:  Total time for pt hx, exam, review of record with pt in the room, determination of diagnoses and plan for further eval and tx is > 40 min, with over 50% spent in coordination and counseling of patient

## 2013-10-15 NOTE — Assessment & Plan Note (Signed)
To hold flonase as above

## 2013-10-17 ENCOUNTER — Ambulatory Visit: Payer: Medicare Other | Admitting: Family Medicine

## 2013-10-23 ENCOUNTER — Ambulatory Visit (INDEPENDENT_AMBULATORY_CARE_PROVIDER_SITE_OTHER): Payer: Medicare Other | Admitting: Family Medicine

## 2013-10-23 ENCOUNTER — Encounter: Payer: Self-pay | Admitting: Family Medicine

## 2013-10-23 VITALS — BP 152/87 | HR 89 | Temp 99.4°F | Wt 280.0 lb

## 2013-10-23 DIAGNOSIS — J069 Acute upper respiratory infection, unspecified: Secondary | ICD-10-CM

## 2013-10-23 MED ORDER — FLUTICASONE PROPIONATE 50 MCG/ACT NA SUSP: 2.0000 | Freq: Every day | NASAL | Status: DC

## 2013-10-23 MED ORDER — LORATADINE 10 MG PO TABS: 10.0000 mg | ORAL_TABLET | Freq: Every day | ORAL | Status: DC

## 2013-10-23 MED ORDER — AZELASTINE-FLUTICASONE 137-50 MCG/ACT NA SUSP: 1.0000 | Freq: Two times a day (BID) | NASAL | Status: DC

## 2013-10-23 MED ORDER — GUAIFENESIN-CODEINE 100-10 MG/5ML PO SYRP: ORAL_SOLUTION | ORAL | Status: DC

## 2013-10-23 NOTE — Patient Instructions (Signed)

## 2013-10-23 NOTE — Progress Notes (Signed)
  Subjective:     Dennis Hill is a 68 y.o. male who presents for evaluation of symptoms of a URI. Symptoms include nasal congestion, non productive cough and post nasal drip. Onset of symptoms was 2 days ago, and has been gradually worsening since that time. Treatment to date: none.  The following portions of the patient's history were reviewed and updated as appropriate: allergies, current medications, past family history, past medical history, past social history, past surgical history and problem list.  Review of Systems Pertinent items are noted in HPI.   Objective:    BP 152/87  Pulse 89  Temp(Src) 99.4 F (37.4 C) (Oral)  Wt 280 lb (127.007 kg)  SpO2 96% General appearance: alert, cooperative, appears stated age and no distress Ears: normal TM's and external ear canals both ears Nose: clear discharge, moderate congestion, turbinates red, swollen, no sinus tenderness Throat: abnormal findings: pnd Neck: no adenopathy, supple, symmetrical, trachea midline and thyroid not enlarged, symmetric, no tenderness/mass/nodules Lungs: clear to auscultation bilaterally Heart: S1, S2 normal   Assessment:    viral upper respiratory illness   Plan:    Discussed diagnosis and treatment of URI. Suggested symptomatic OTC remedies. Nasal saline spray for congestion. Nasal steroids per orders. Follow up as needed.

## 2013-10-23 NOTE — Progress Notes (Signed)
Pre visit review using our clinic review tool, if applicable. No additional management support is needed unless otherwise documented below in the visit note. 

## 2013-10-27 ENCOUNTER — Telehealth: Payer: Self-pay | Admitting: *Deleted

## 2013-10-27 MED ORDER — TAMSULOSIN HCL 0.4 MG PO CAPS: 0.4000 mg | ORAL_CAPSULE | Freq: Every day | ORAL | Status: DC

## 2013-10-27 NOTE — Telephone Encounter (Signed)
Last OV with PCP 10/15/13.  Patient phoned requesting refill on flomax.  Refilled per protocol and patient notified.

## 2014-01-09 ENCOUNTER — Encounter: Payer: Self-pay | Admitting: Internal Medicine

## 2014-01-09 ENCOUNTER — Ambulatory Visit (INDEPENDENT_AMBULATORY_CARE_PROVIDER_SITE_OTHER): Payer: Medicare Other | Admitting: Internal Medicine

## 2014-01-09 VITALS — BP 140/80 | HR 64 | Temp 97.7°F | Ht 77.0 in | Wt 277.6 lb

## 2014-01-09 DIAGNOSIS — Z23 Encounter for immunization: Secondary | ICD-10-CM

## 2014-01-09 DIAGNOSIS — Z0001 Encounter for general adult medical examination with abnormal findings: Secondary | ICD-10-CM | POA: Insufficient documentation

## 2014-01-09 DIAGNOSIS — Z0000 Encounter for general adult medical examination without abnormal findings: Secondary | ICD-10-CM

## 2014-01-09 DIAGNOSIS — E119 Type 2 diabetes mellitus without complications: Secondary | ICD-10-CM

## 2014-01-09 NOTE — Addendum Note (Signed)
Addended by: Julieta Bellini on: 01/09/2014 09:31 AM   Modules accepted: Orders

## 2014-01-09 NOTE — Patient Instructions (Addendum)
You had the new Prevnar pneumonia shot today  Please continue all other medications as before, and refills have been done if requested. Please have the pharmacy call with any other refills you may need.  Please continue your efforts at being more active, low cholesterol diet, and weight control.  You are otherwise up to date with prevention measures today.  Please go to the LAB in the Basement (turn left off the elevator) for the tests to be done today You will be contacted by phone if any changes need to be made immediately.  Otherwise, you will receive a letter about your results with an explanation, but please check with MyChart first.  Please keep your appointments with your specialists as you have planned  Please return in 6 months, or sooner if needed

## 2014-01-09 NOTE — Assessment & Plan Note (Signed)
stable overall by history and exam, recent data reviewed with pt, and pt to continue medical treatment as before,  to f/u any worsening symptoms or concerns Lab Results  Component Value Date   HGBA1C 6.8* 09/05/2013    

## 2014-01-09 NOTE — Progress Notes (Signed)
Subjective:    Patient ID: Dennis Hill, male    DOB: 06-04-1946, 68 y.o.   MRN: [id number]  HPI Here for wellness and f/u;  Overall doing ok;  Pt denies CP, worsening SOB, DOE, wheezing, orthopnea, PND, worsening LE edema, palpitations, dizziness or syncope.  Pt denies neurological change such as new headache, facial or extremity weakness.  Pt denies polydipsia, polyuria, or low sugar symptoms. Pt states overall good compliance with treatment and medications, good tolerability, and has been trying to follow lower cholesterol diet.  Pt denies worsening depressive symptoms, suicidal ideation or panic. No fever, night sweats, wt loss, loss of appetite, or other constitutional symptoms.  Pt states good ability with ADL's, has low fall risk, home safety reviewed and adequate, no other significant changes in hearing or vision, and only occasionally active with exercise.  Hard to lose wt. No current complaints Past Medical History  Diagnosis Date  . DIABETES MELLITUS, TYPE II 01/28/2010    diet controlled  . DEPRESSION 03/15/2009  . GERD 03/15/2009  . DIVERTICULOSIS, COLON 03/15/2009  . 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 FOOT PAIN, LEFT 04/05/2010  . RUQ PAIN 01/28/2010  . Hx of adenomatous colonic polyps 03/15/2009  . NEPHROLITHIASIS, HX OF 01/28/2010  . Anxiety   . Chronic insomnia   . Fatty liver   . Internal hemorrhoids   . Hyperlipidemia 02/10/2011  . Atrial bigeminy 2008    Dr. Caryl Comes  . Atrial fibrillation   . Atrial flutter   . Pneumonia ~ 1950  . Exertional shortness of breath     "lately" (03/06/2013)  . Sleep apnea   . WUJWJXBJ(478.2)     "monthly" (03/06/2013)  . 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 BPH (benign prostatic hypertrophy) 03/21/2013   Past Surgical History  Procedure Laterality Date  . Appendectomy    . Cholecystectomy  2006  . Tonsillectomy and adenoidectomy  ~ 1952  . Inguinal hernia repair Right 1986  . Cardioversion N/A 02/06/2013    Procedure: CARDIOVERSION;   Surgeon: Evans Lance, MD;  Location: [place];  Service: Cardiovascular;  Laterality: N/A;  . Tee without cardioversion N/A 03/05/2013    Procedure: TRANSESOPHAGEAL ECHOCARDIOGRAM (TEE);  Surgeon: Larey Dresser, MD;  Location: [place];  Service: Cardiovascular;  Laterality: N/A;  Jeannene Patella Greggory Brandy  . Atrial flutter ablation  03/06/2013    reports that he quit smoking about 34 years ago. His smoking use included Cigarettes. He has a 13 pack-year smoking history. He has never used smokeless tobacco. He reports that he drinks about .6 ounces of alcohol per week. He reports that he does not use illicit drugs. family history includes Colon cancer in his mother; Lung cancer (age of onset: 81) in his mother. There is no history of CAD. Allergies  Allergen Reactions  . Lisinopril Other (See Comments)    Tongue swelling   Current Outpatient Prescriptions on File Prior to Visit  Medication Sig Dispense Refill  . acetaminophen (TYLENOL) 500 MG tablet Take 500 mg by mouth every 6 (six) hours as needed for pain.      Marland Kitchen esomeprazole (NEXIUM) 40 MG capsule Take 40 mg by mouth daily as needed (for stomach acid).      Marland Kitchen aspirin EC 81 MG tablet Take 1 tablet (81 mg total) by mouth daily.  90 tablet  11   No current facility-administered medications on file prior to visit.   Review of Systems Constitutional: Negative for increased diaphoresis, other activity, appetite or other siginficant weight  change  HENT: Negative for worsening hearing loss, ear pain, facial swelling, mouth sores and neck stiffness.   Eyes: Negative for other worsening pain, redness or visual disturbance.  Respiratory: Negative for shortness of breath and wheezing.   Cardiovascular: Negative for chest pain and palpitations.  Gastrointestinal: Negative for diarrhea, blood in stool, abdominal distention or other pain Genitourinary: Negative for hematuria, flank pain or change in urine volume.  Musculoskeletal: Negative for myalgias or  other joint complaints.  Skin: Negative for color change and wound.  Neurological: Negative for syncope and numbness. other than noted Hematological: Negative for adenopathy. or other swelling Psychiatric/Behavioral: Negative for hallucinations, self-injury, decreased concentration or other worsening agitation.      Objective:   Physical Exam BP 140/80  Pulse 64  Temp(Src) 97.7 F (36.5 C) (Oral)  Ht 6\' 5"  (1.956 m)  Wt 277 lb 9.6 oz (125.919 kg)  BMI 32.91 kg/m2  SpO2 98% VS noted,  Constitutional: Pt is oriented to person, place, and time. Appears well-developed and well-nourished.  Head: Normocephalic and atraumatic.  Right Ear: External ear normal.  Left Ear: External ear normal.  Nose: Nose normal.  Mouth/Throat: Oropharynx is clear and moist.  Eyes: Conjunctivae and EOM are normal. Pupils are equal, round, and reactive to light.  Neck: Normal range of motion. Neck supple. No JVD present. No tracheal deviation present.  Cardiovascular: Normal rate, regular rhythm, normal heart sounds and intact distal pulses.   Pulmonary/Chest: Effort normal and breath sounds without rales or wheezing  Abdominal: Soft. Bowel sounds are normal. NT. No HSM  Musculoskeletal: Normal range of motion. Exhibits no edema.  Lymphadenopathy:  Has no cervical adenopathy.  Neurological: Pt is alert and oriented to person, place, and time. Pt has normal reflexes. No cranial nerve deficit. Motor grossly intact Skin: Skin is warm and dry. No rash noted.  Psychiatric:  Has normal mood and affect. Behavior is normal.     Assessment & Plan:

## 2014-01-09 NOTE — Assessment & Plan Note (Signed)

## 2014-01-09 NOTE — Progress Notes (Signed)
Pre visit review using our clinic review tool, if applicable. No additional management support is needed unless otherwise documented below in the visit note. 

## 2014-04-15 LAB — HM DIABETES EYE EXAM

## 2014-05-11 ENCOUNTER — Encounter: Payer: Self-pay | Admitting: Internal Medicine

## 2014-05-29 ENCOUNTER — Ambulatory Visit (INDEPENDENT_AMBULATORY_CARE_PROVIDER_SITE_OTHER): Payer: Medicare Other | Admitting: Internal Medicine

## 2014-05-29 ENCOUNTER — Telehealth: Payer: Self-pay

## 2014-05-29 ENCOUNTER — Other Ambulatory Visit: Payer: Self-pay | Admitting: Internal Medicine

## 2014-05-29 ENCOUNTER — Ambulatory Visit (INDEPENDENT_AMBULATORY_CARE_PROVIDER_SITE_OTHER)
Admission: RE | Admit: 2014-05-29 | Discharge: 2014-05-29 | Disposition: A | Discharge status: Home / Self Care | Payer: Medicare Other | Source: Ambulatory Visit | Attending: Internal Medicine | Admitting: Internal Medicine

## 2014-05-29 ENCOUNTER — Other Ambulatory Visit (INDEPENDENT_AMBULATORY_CARE_PROVIDER_SITE_OTHER): Payer: Medicare Other

## 2014-05-29 ENCOUNTER — Encounter: Payer: Self-pay | Admitting: Internal Medicine

## 2014-05-29 VITALS — BP 110/82 | HR 73 | Temp 98.3°F | Ht 77.0 in | Wt 273.0 lb

## 2014-05-29 DIAGNOSIS — E785 Hyperlipidemia, unspecified: Secondary | ICD-10-CM

## 2014-05-29 DIAGNOSIS — R062 Wheezing: Secondary | ICD-10-CM

## 2014-05-29 DIAGNOSIS — N400 Enlarged prostate without lower urinary tract symptoms: Secondary | ICD-10-CM

## 2014-05-29 DIAGNOSIS — R079 Chest pain, unspecified: Secondary | ICD-10-CM | POA: Insufficient documentation

## 2014-05-29 DIAGNOSIS — IMO0001 Reserved for inherently not codable concepts without codable children: Secondary | ICD-10-CM

## 2014-05-29 DIAGNOSIS — Z0000 Encounter for general adult medical examination without abnormal findings: Secondary | ICD-10-CM

## 2014-05-29 DIAGNOSIS — E1165 Type 2 diabetes mellitus with hyperglycemia: Principal | ICD-10-CM

## 2014-05-29 DIAGNOSIS — J309 Allergic rhinitis, unspecified: Secondary | ICD-10-CM

## 2014-05-29 DIAGNOSIS — R0789 Other chest pain: Secondary | ICD-10-CM

## 2014-05-29 DIAGNOSIS — E079 Disorder of thyroid, unspecified: Secondary | ICD-10-CM

## 2014-05-29 LAB — CBC WITH DIFFERENTIAL/PLATELET
BASOS ABS: 0.1 10*3/uL (ref 0.0–0.1)
Basophils Relative: 1.1 % (ref 0.0–3.0)
EOS PCT: 3.5 % (ref 0.0–5.0)
Eosinophils Absolute: 0.3 10*3/uL (ref 0.0–0.7)
HEMATOCRIT: 46.2 % (ref 39.0–52.0)
Hemoglobin: 15.4 g/dL (ref 13.0–17.0)
Lymphocytes Relative: 29 % (ref 12.0–46.0)
Lymphs Abs: 2.2 10*3/uL (ref 0.7–4.0)
MCHC: 33.3 g/dL (ref 30.0–36.0)
MCV: 88.8 fl (ref 78.0–100.0)
MONO ABS: 0.8 10*3/uL (ref 0.1–1.0)
MONOS PCT: 10.2 % (ref 3.0–12.0)
NEUTROS PCT: 56.2 % (ref 43.0–77.0)
Neutro Abs: 4.2 10*3/uL (ref 1.4–7.7)
PLATELETS: 217 10*3/uL (ref 150.0–400.0)
RBC: 5.2 Mil/uL (ref 4.22–5.81)
RDW: 13.6 % (ref 11.5–15.5)
WBC: 7.5 10*3/uL (ref 4.0–10.5)

## 2014-05-29 LAB — URINALYSIS, ROUTINE W REFLEX MICROSCOPIC
BILIRUBIN URINE: NEGATIVE
Hgb urine dipstick: NEGATIVE
KETONES UR: NEGATIVE
LEUKOCYTES UA: NEGATIVE
NITRITE: NEGATIVE
PH: 6 (ref 5.0–8.0)
Specific Gravity, Urine: 1.015 (ref 1.000–1.030)
Total Protein, Urine: NEGATIVE
UROBILINOGEN UA: 1 (ref 0.0–1.0)
Urine Glucose: NEGATIVE

## 2014-05-29 LAB — BASIC METABOLIC PANEL
BUN: 18 mg/dL (ref 6–23)
CO2: 31 mEq/L (ref 19–32)
Calcium: 10 mg/dL (ref 8.4–10.5)
Chloride: 103 mEq/L (ref 96–112)
Creatinine, Ser: 1.2 mg/dL (ref 0.4–1.5)
GFR: 61.21 mL/min (ref >60.00)
Glucose, Bld: 107 mg/dL — ABNORMAL HIGH (ref 70–99)
POTASSIUM: 4.7 meq/L (ref 3.5–5.1)
SODIUM: 139 meq/L (ref 135–145)

## 2014-05-29 LAB — TSH: TSH: 1.8 u[IU]/mL (ref 0.35–4.50)

## 2014-05-29 LAB — HEPATIC FUNCTION PANEL
ALBUMIN: 4.2 g/dL (ref 3.5–5.2)
ALK PHOS: 69 U/L (ref 39–117)
ALT: 19 U/L (ref 0–53)
AST: 19 U/L (ref 0–37)
Bilirubin, Direct: 0.1 mg/dL (ref 0.0–0.3)
Total Bilirubin: 0.6 mg/dL (ref 0.2–1.2)
Total Protein: 7 g/dL (ref 6.0–8.3)

## 2014-05-29 LAB — MICROALBUMIN / CREATININE URINE RATIO
Creatinine,U: 159.6 mg/dL
Microalb Creat Ratio: 1.1 mg/g (ref 0.0–30.0)
Microalb, Ur: 1.7 mg/dL (ref 0.0–1.9)

## 2014-05-29 LAB — PSA: PSA: 3.12 ng/mL (ref 0.10–4.00)

## 2014-05-29 LAB — HEMOGLOBIN A1C: HEMOGLOBIN A1C: 6.6 % — AB (ref 4.6–6.5)

## 2014-05-29 LAB — LIPID PANEL
CHOLESTEROL: 148 mg/dL (ref 0–200)
HDL: 31.1 mg/dL — ABNORMAL LOW (ref >39.00)
LDL CALC: 102 mg/dL — AB (ref 0–99)
NonHDL: 116.9
TRIGLYCERIDES: 76 mg/dL (ref 0.0–149.0)
Total CHOL/HDL Ratio: 5
VLDL: 15.2 mg/dL (ref 0.0–40.0)

## 2014-05-29 LAB — TROPONIN I: Troponin I: <0.01 ng/mL (ref <0.06)

## 2014-05-29 MED ORDER — ESOMEPRAZOLE MAGNESIUM 40 MG PO CPDR: 40.0000 mg | DELAYED_RELEASE_CAPSULE | Freq: Every day | ORAL | Status: DC | PRN

## 2014-05-29 MED ORDER — CETIRIZINE HCL 10 MG PO TABS: 10.0000 mg | ORAL_TABLET | Freq: Every day | ORAL | Status: DC

## 2014-05-29 MED ORDER — FLUTICASONE PROPIONATE 50 MCG/ACT NA SUSP: 2.0000 | Freq: Every day | NASAL | Status: DC

## 2014-05-29 MED ORDER — ALBUTEROL SULFATE HFA 108 (90 BASE) MCG/ACT IN AERS: 2.0000 | INHALATION_SPRAY | Freq: Four times a day (QID) | RESPIRATORY_TRACT | Status: DC | PRN

## 2014-05-29 MED ORDER — METHYLPREDNISOLONE ACETATE 80 MG/ML IJ SUSP
80.0000 mg | Freq: Once | INTRAMUSCULAR | Status: AC
  Administered 2014-05-29: 80 mg via INTRAMUSCULAR

## 2014-05-29 NOTE — Progress Notes (Signed)
Subjective:    Patient ID: Dennis Hill, male    DOB: 1945-12-17, 68 y.o.   MRN: [id number]  HPI  Here to f/u; overall doing ok,  Pt denies orthopnea, PND, increased LE swelling, palpitations, dizziness or syncope, but does have cough and wheeze for several wks, actually improved with going up a flight of stairs and breathes more deeply. Does have several wks ongoing nasal allergy symptoms with clearish congestion, itch and sneezing, without fever, pain, ST,swelling, has flonase at home old bottle, not using.  Has not had to try inhalers before.   Pt denies polydipsia, polyuria, or low sugar symptoms such as weakness or confusion improved with po intake.  Pt denies new neurological symptoms such as new headache, or facial or extremity weakness or numbness.   Pt states overall good compliance with meds, has been trying to follow lower cholesterol, diabetic diet, with wt overall stable,  but little exercise however. Lost wt  7 lbs with with less sugar, less pasta since last visit.. Wt Readings from Last 3 Encounters:  05/29/14 273 lb (123.832 kg)  01/09/14 277 lb 9.6 oz (125.919 kg)  10/23/13 280 lb (127.007 kg)  Does have severe itch for some reason to scalp, arms, across the chest for over 1 mo, incidentally noted no itching when out of town for a few days.  Itchng re-started with coming home.  No rash or swelling.  Not tried any OTC  Also menitons an episode of mid chest pain with radiation to the neck, left arm and back. Had similar episode in past but more severe and never had radiated to throat previously. None in last 76mo mo Was propped in bed, half lying down, Not eating or drinking. Onset approx 10 pm, last 15 min, moderate pain, no dipaphoreiss, but had to breathe deeper and "calm my system down."  Has hx of ablation, no palp or heart racing.  Nothing seemed to make better or worse.  No pain since then, none today.  Back on flomax, now urinating better, take qod.  Declined flu  shot Past Medical History  Diagnosis Date  . DIABETES MELLITUS, TYPE II 01/28/2010    diet controlled  . DEPRESSION 03/15/2009  . GERD 03/15/2009  . DIVERTICULOSIS, COLON 03/15/2009  . 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 FOOT PAIN, LEFT 04/05/2010  . RUQ PAIN 01/28/2010  . Hx of adenomatous colonic polyps 03/15/2009  . NEPHROLITHIASIS, HX OF 01/28/2010  . Anxiety   . Chronic insomnia   . Fatty liver   . Internal hemorrhoids   . Hyperlipidemia 02/10/2011  . Atrial bigeminy 2008    Dr. Caryl Comes  . Atrial fibrillation   . Atrial flutter   . Pneumonia ~ 1950  . Exertional shortness of breath     "lately" (03/06/2013)  . Sleep apnea   . WUJWJXBJ(478.2)     "monthly" (03/06/2013)  . 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 BPH (benign prostatic hypertrophy) 03/21/2013   Past Surgical History  Procedure Laterality Date  . Appendectomy    . Cholecystectomy  2006  . Tonsillectomy and adenoidectomy  ~ 1952  . Inguinal hernia repair Right 1986  . Cardioversion N/A 02/06/2013    Procedure: CARDIOVERSION;  Surgeon: Evans Lance, MD;  Location: [facility];  Service: Cardiovascular;  Laterality: N/A;  . Tee without cardioversion N/A 03/05/2013    Procedure: TRANSESOPHAGEAL ECHOCARDIOGRAM (TEE);  Surgeon: Larey Dresser, MD;  Location: [facility];  Service: Cardiovascular;  Laterality: N/A;  Jeannene Patella Greggory Brandy  . Atrial  flutter ablation  03/06/2013    reports that he quit smoking about 34 years ago. His smoking use included Cigarettes. He has a 13 pack-year smoking history. He has never used smokeless tobacco. He reports that he drinks about .6 ounces of alcohol per week. He reports that he does not use illicit drugs. family history includes Colon cancer in his mother; Lung cancer (age of onset: 49) in his mother. There is no history of CAD. Allergies  Allergen Reactions  . Lisinopril Other (See Comments)    Tongue swelling   Current Outpatient Prescriptions on File Prior to Visit  Medication Sig Dispense  Refill  . acetaminophen (TYLENOL) 500 MG tablet Take 500 mg by mouth every 6 (six) hours as needed for pain.      Marland Kitchen aspirin EC 81 MG tablet Take 1 tablet (81 mg total) by mouth daily.  90 tablet  11  . Azelastine-Fluticasone 137-50 MCG/ACT SUSP Place 1 spray into the nose as needed.      Marland Kitchen esomeprazole (NEXIUM) 40 MG capsule Take 40 mg by mouth daily as needed (for stomach acid).       No current facility-administered medications on file prior to visit.    Review of Systems  Constitutional: Negative for unusual diaphoresis or other sweats  HENT: Negative for ringing in ear Eyes: Negative for double vision or worsening visual disturbance.  Respiratory: Negative for choking and stridor.   Gastrointestinal: Negative for vomiting or other signifcant bowel change Genitourinary: Negative for hematuria or decreased urine volume.  Musculoskeletal: Negative for other MSK pain or swelling Skin: Negative for color change and worsening wound.  Neurological: Negative for tremors and numbness other than noted  Psychiatric/Behavioral: Negative for decreased concentration or agitation other than above       Objective:   Physical Exam BP 110/82  Pulse 73  Temp(Src) 98.3 F (36.8 C) (Oral)  Ht 6\' 5"  (1.956 m)  Wt 273 lb (123.832 kg)  BMI 32.37 kg/m2  SpO2 97% VS noted,  Constitutional: Pt appears well-developed, well-nourished. Annabell Sabal HENT: Head: NCAT.  Right Ear: External ear normal.  Left Ear: External ear normal.  Eyes: . Pupils are equal, round, and reactive to light. Conjunctivae and EOM are normal Neck: Normal range of motion. Neck supple. with ? Firm nodular area to right thyroid vs lyphadenopathy? Cardiovascular: Normal rate and regular rhythm.   Pulmonary/Chest: Effort normal and breath sounds normal.  Abd:  Soft, NT, ND, + BS, no HSM Neurological: Pt is alert. Not confused , motor grossly intact Skin: Skin is warm. No rash Psychiatric: Pt behavior is normal. No agitation.      Assessment & Plan:

## 2014-05-29 NOTE — Progress Notes (Signed)
Pre visit review using our clinic review tool, if applicable. No additional management support is needed unless otherwise documented below in the visit note. 

## 2014-05-29 NOTE — Telephone Encounter (Signed)
lab

## 2014-05-29 NOTE — Assessment & Plan Note (Addendum)
Atypical, no significant prior hx of cad, is s/p ablation as documented/Dr Caryl Comes; ? Esophageal spasm - to increase the nexium from prn to daily, ECG reviewed as per emr, also for troponin I with labs today, cxr, stress testing and card referral  Note:  Total time for pt hx, exam, review of record with pt in the room, determination of diagnoses and plan for further eval and tx is > 40 min, with over 50% spent in coordination and counseling of patient

## 2014-05-29 NOTE — Patient Instructions (Addendum)
You had the steroid shot today  Your EKG was [facility] today  Please take all new medication as prescribed - the zyrtec, and flonase daily for allergies, and the Albuterol Inhaler for wheezing and breathing as needed  Please also take the nexium daily instead of "as needed"  Please continue all other medications as before, and refills have been done if requested.  Please have the pharmacy call with any other refills you may need.  Please keep your appointments with your specialists as you may have planned  You will be contacted regarding the referral for: stress test, and cardiology referral, as well as an ultrasound for the neck  Please go to the XRAY Department in the Basement (go straight as you get off the elevator) for the x-ray testing  Please go to the LAB in the Basement (turn left off the elevator) for the tests to be done today  Please return in 6 months, or sooner if needed

## 2014-05-30 DIAGNOSIS — E079 Disorder of thyroid, unspecified: Secondary | ICD-10-CM | POA: Insufficient documentation

## 2014-05-30 DIAGNOSIS — R062 Wheezing: Secondary | ICD-10-CM | POA: Insufficient documentation

## 2014-05-30 NOTE — Assessment & Plan Note (Signed)
?   Thyroid vs other neck mass - for thryoid u/s

## 2014-05-30 NOTE — Assessment & Plan Note (Signed)
?   Mild asthma, for tx as per allergies above, also alb MDI prn

## 2014-05-30 NOTE — Assessment & Plan Note (Signed)
Mild to mod, for depomedrol IM, zyrtec/flonase,   to f/u any worsening symptoms or concerns

## 2014-06-04 ENCOUNTER — Ambulatory Visit (INDEPENDENT_AMBULATORY_CARE_PROVIDER_SITE_OTHER): Payer: Medicare Other | Admitting: Physician Assistant

## 2014-06-04 ENCOUNTER — Encounter: Payer: Self-pay | Admitting: Physician Assistant

## 2014-06-04 VITALS — BP 128/84 | HR 57 | Ht 77.0 in | Wt 272.4 lb

## 2014-06-04 DIAGNOSIS — I4892 Unspecified atrial flutter: Secondary | ICD-10-CM

## 2014-06-04 DIAGNOSIS — I428 Other cardiomyopathies: Secondary | ICD-10-CM

## 2014-06-04 DIAGNOSIS — R079 Chest pain, unspecified: Secondary | ICD-10-CM

## 2014-06-04 DIAGNOSIS — E785 Hyperlipidemia, unspecified: Secondary | ICD-10-CM

## 2014-06-04 DIAGNOSIS — G4733 Obstructive sleep apnea (adult) (pediatric): Secondary | ICD-10-CM

## 2014-06-04 DIAGNOSIS — E119 Type 2 diabetes mellitus without complications: Secondary | ICD-10-CM

## 2014-06-04 DIAGNOSIS — I429 Cardiomyopathy, unspecified: Secondary | ICD-10-CM

## 2014-06-04 MED ORDER — ASPIRIN EC 81 MG PO TBEC: 81.0000 mg | DELAYED_RELEASE_TABLET | Freq: Every day | ORAL | Status: DC

## 2014-06-04 NOTE — Patient Instructions (Signed)
Your physician has recommended you make the following change in your medication:  START ASPIRIN 81 MG DAILY  Your physician has requested that you have en exercise stress myoview. For further information please visit [email or web address]. Please follow instruction sheet, as given.

## 2014-06-04 NOTE — Progress Notes (Signed)
Cardiology Office Note    Date:  06/04/2014   ID:  JAHMIR SALO, DOB 1945-12-03, MRN [id number]  PCP:  [name], MD  Cardiologist:  Dr. Lauree Chandler  Electrophysiologist:  Dr. [name]    History of Present Illness: Dennis Hill is a 68 y.o. male with a hx of AFib/Flutter s/p AFlutter ablation in 02/2013, cardiomyopathy with EF 35-40% that improved to 45-50% with restoration of NSR, DM2, HL.  Eliquis was DC'd after his ablation.  Patient had fatigue with beta blocker Rx and this was changed to Diltiazem.  CCB was DC'd after ablation as well.  Last seen by Dr. [name] 03/2013.  Sleep study demonstrated just mild OSA.  FU echo demonstrated improved LVF.  He was recently seen by his PCP 05/29/14 with chest pain.  He is referred back for further evaluation.    Patient notes episode of chest tightness at rest last week with radiation to his neck and L arm.  This lasted about 4-5 minutes.  He had no associated symptoms.  PCP drew a troponin and this was negative.  He is fairly active.  He works in [employer] and walks quite a bit during his day.  He uses a push mower to cut his lawn.  He denies any exertional symptoms.  Does note decreased stamina.  Denies significant dyspnea.  He is NYHA 2.  Denies orthopnea, PND, edema.  Denies syncope.     Studies:  - Echo (6/14):  Mild LVH, EF 35-40%, PASP 31 mmHg  - Echo (8/14):  EF 45-50%, diff HK, Gr 1 DD, mildly dilated Ao root (40 mm), mild BAE  - EPS with LA mapping, arrhtymia mapping and RFCA of SVT (03/06/13)   Recent Labs/Images: 09/05/2013: Direct LDL 87.2  05/29/2014: ALT 19; Creatinine 1.2; HDL Cholesterol by NMR 31.10*; Hemoglobin 15.4; LDL (calc) 102*; Potassium 4.7; TSH 1.80   Dg Chest 2 View  05/29/2014   IMPRESSION: No active cardiopulmonary disease.   Electronically Signed   By: [name]   On: 05/29/2014 11:19     Wt Readings from Last 3 Encounters:  05/29/14 273 lb (123.832 kg)  01/09/14 277 lb  9.6 oz (125.919 kg)  10/23/13 280 lb (127.007 kg)     Past Medical History  Diagnosis Date  . DIABETES MELLITUS, TYPE II 01/28/2010    diet controlled  . DEPRESSION 03/15/2009  . GERD 03/15/2009  . DIVERTICULOSIS, COLON 03/15/2009  . 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 FOOT PAIN, LEFT 04/05/2010  . RUQ PAIN 01/28/2010  . Hx of adenomatous colonic polyps 03/15/2009  . NEPHROLITHIASIS, HX OF 01/28/2010  . Anxiety   . Chronic insomnia   . Fatty liver   . Internal hemorrhoids   . Hyperlipidemia 02/10/2011  . Atrial bigeminy 2008    Dr. Caryl Comes  . Atrial fibrillation   . Atrial flutter   . Pneumonia ~ 1950  . Exertional shortness of breath     "lately" (03/06/2013)  . Sleep apnea   . NWGNFAOZ(308.6)     "monthly" (03/06/2013)  . 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 BPH (benign prostatic hypertrophy) 03/21/2013    Current Outpatient Prescriptions  Medication Sig Dispense Refill  . acetaminophen (TYLENOL) 500 MG tablet Take 500 mg by mouth every 6 (six) hours as needed for pain.      Marland Kitchen albuterol (PROVENTIL HFA;VENTOLIN HFA) 108 (90 BASE) MCG/ACT inhaler Inhale 2 puffs into the lungs every 6 (six) hours as needed for wheezing or shortness of  breath.  1 Inhaler  5  . aspirin EC 81 MG tablet Take 1 tablet (81 mg total) by mouth daily.  90 tablet  11  . Azelastine-Fluticasone 137-50 MCG/ACT SUSP Place 1 spray into the nose as needed.      . cetirizine (ZYRTEC) 10 MG tablet Take 1 tablet (10 mg total) by mouth daily.  30 tablet  11  . esomeprazole (NEXIUM) 40 MG capsule Take 1 capsule (40 mg total) by mouth daily as needed (for stomach acid).  90 capsule  3  . fluticasone (FLONASE) 50 MCG/ACT nasal spray Place 2 sprays into both nostrils daily.  16 g  2   No current facility-administered medications for this visit.    Allergies:   Lisinopril   Social History:  The patient  reports that he quit smoking about 34 years ago. His smoking use included Cigarettes. He has a 13 pack-year smoking history.  He has never used smokeless tobacco. He reports that he drinks about .6 ounces of alcohol per week. He reports that he does not use illicit drugs.   Family History:  The patient's family history includes Colon cancer in his mother; Lung cancer (age of onset: 40) in his mother. There is no history of CAD.   ROS:  Please see the history of present illness.      All other systems reviewed and negative.   PHYSICAL EXAM: VS:  BP 128/84  Pulse 57  Ht 6\' 5"  (1.956 m)  Wt 272 lb 6.4 oz (123.56 kg)  BMI 32.30 kg/m2 Well nourished, well developed, in no acute distress HEENT: normal Neck: no JVD Vascular:  No carotid bruits Cardiac:  normal S1, S2; RRR; no murmur Lungs:  clear to auscultation bilaterally, no wheezing, rhonchi or rales Abd: soft, nontender, no hepatomegaly Ext: no edema Skin: warm and dry Neuro:  CNs 2-12 intact, no focal abnormalities noted  EKG:  Sinus brady, HR 57, NSSTTW changes      ASSESSMENT AND PLAN:  1. Chest pain, unspecified chest pain type:  Somewhat atypical, but with some typical features.  He does have a risk equivalent of diabetes that is diet controlled.  Agree with proceeding with ETT-Myoview to further assess.  If this is low risk, no further cardiac workup.   2. Atrial flutter, unspecified:  No recurrence.  Dr. Caryl Comes took him off of anticoagulation after his RFCA.   3. Cardiomyopathy:  He has been intol or beta blockers and ARBs.  There is a question of angioedema with ACEI.  Last echo with improved EF.  Will see what his EF is on nuclear stress test.   4. Hyperlipidemia:  Managed by primary care.   5. Obstructive sleep apnea:  Mild by sleep study last year.  6. Type II or unspecified type diabetes mellitus without mention of complication, not stated as uncontrolled:  Well controlled on diet only.  Last A1c 6.6.  Disposition:  FU with Dr. Lauree Chandler in 6 mos or sooner if Myoview abnormal.     Signed, Versie Starks, MHS 06/04/2014 8:47 AM     [facility], [place], [place]  [address] Phone: [phone]; Fax: [phone]

## 2014-06-05 ENCOUNTER — Ambulatory Visit
Admission: RE | Admit: 2014-06-05 | Discharge: 2014-06-05 | Disposition: A | Discharge status: Home / Self Care | Payer: Medicare Other | Source: Ambulatory Visit | Attending: Internal Medicine | Admitting: Internal Medicine

## 2014-06-05 DIAGNOSIS — E079 Disorder of thyroid, unspecified: Secondary | ICD-10-CM

## 2014-06-10 ENCOUNTER — Ambulatory Visit (HOSPITAL_COMMUNITY): Payer: Medicare Other | Attending: Cardiology | Admitting: Radiology

## 2014-06-10 VITALS — BP 142/102 | HR 75 | Ht 77.0 in | Wt 272.0 lb

## 2014-06-10 DIAGNOSIS — R0789 Other chest pain: Secondary | ICD-10-CM

## 2014-06-10 DIAGNOSIS — R079 Chest pain, unspecified: Secondary | ICD-10-CM | POA: Diagnosis present

## 2014-06-10 DIAGNOSIS — I4892 Unspecified atrial flutter: Secondary | ICD-10-CM

## 2014-06-10 MED ORDER — TECHNETIUM TC 99M SESTAMIBI GENERIC - CARDIOLITE
30.0000 | Freq: Once | INTRAVENOUS | Status: AC | PRN
  Administered 2014-06-10: 30 via INTRAVENOUS

## 2014-06-10 MED ORDER — TECHNETIUM TC 99M SESTAMIBI GENERIC - CARDIOLITE
10.0000 | Freq: Once | INTRAVENOUS | Status: AC | PRN
  Administered 2014-06-10: 10 via INTRAVENOUS

## 2014-06-10 NOTE — Progress Notes (Signed)
[facility] [address] [place], [place] [address] [phone]    Cardiology Nuclear Med Study  Dennis Hill is a 68 y.o. male     MRN : [id number]     DOB: May 02, 1946  Procedure Date: 06/10/2014  Nuclear Med Background Indication for Stress Test:  Evaluation for Ischemia History:  No known CAD, Asthma, Afib Cardiac Risk Factors: N/A  Symptoms:  Chest Pain (last date of chest discomfort was two weeks ago)   Nuclear Pre-Procedure Caffeine/Decaff Intake:  None NPO After: 6pm   Lungs:  clear O2 Sat: 98% on room air. IV 0.9% NS with Angio Cath:  22g  IV Site: R Hand  IV Started by:  Crissie Figures, RN  Chest Size (in):  48 Cup Size: n/a  Height: 6\' 5"  (1.956 m)  Weight:  272 lb (123.378 kg)  BMI:  Body mass index is 32.25 kg/(m^2). Tech Comments:  N/A    Nuclear Med Study 1 or 2 day study: 1 day  Stress Test Type:  Stress  Reading MD: N/A  Order Authorizing Provider:  [name], MD  Resting Radionuclide: Technetium 41m Sestamibi  Resting Radionuclide Dose: 11.0 mCi   Stress Radionuclide:  Technetium 22m Sestamibi  Stress Radionuclide Dose: 33.0 mCi           Stress Protocol Rest HR: 75 Stress HR: 144  Rest BP: 142/102 Stress BP: 172/85  Exercise Time (min): 6:00 METS: 7.0           Dose of Adenosine (mg):  n/a Dose of Lexiscan: n/a mg  Dose of Atropine (mg): n/a Dose of Dobutamine: n/a mcg/kg/min (at max HR)  Stress Test Technologist: Glade Lloyd, BS-ES  Nuclear Technologist:  Earl Many, CNMT     Rest Procedure:  Myocardial perfusion imaging was performed at rest 45 minutes following the intravenous administration of Technetium 57m Sestamibi. Rest ECG: NSR with non-specific ST-T wave changes  Stress Procedure:  The patient exercised on the treadmill utilizing the Bruce Protocol for 6:00 minutes. The patient stopped due to fatigue and denied any chest pain.  Technetium 21m Sestamibi was injected at peak exercise and  myocardial perfusion imaging was performed after a brief delay. Stress ECG: Inferolateral T wave changes and PVCS  QPS Raw Data Images:  Normal; no motion artifact; normal heart/lung ratio. Stress Images:  There is decreased uptake in the inferior wall. Rest Images:  There is decreased uptake in the inferior wall. Subtraction (SDS):  There is a fixed defect that is most consistent with a previous infarction. Transient Ischemic Dilatation (Normal <1.22):  1.03 Lung/Heart Ratio (Normal <0.45):  0.30  Quantitative Gated Spect Images QGS EDV:  130 ml QGS ESV:  76 ml  Impression Exercise Capacity:  Fair exercise capacity. BP Response:  Normal blood pressure response. Clinical Symptoms:  No significant symptoms noted. 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  LV Ejection Fraction: 41%.  LV Wall Motion:  Normal Wall Motion or Inferior and apical wall hypokinesis   Jenkins Rouge

## 2014-06-11 ENCOUNTER — Encounter: Payer: Self-pay | Admitting: Internal Medicine

## 2014-06-11 ENCOUNTER — Encounter: Payer: Self-pay | Admitting: Physician Assistant

## 2014-06-12 ENCOUNTER — Telehealth: Payer: Self-pay | Admitting: Internal Medicine

## 2014-06-12 NOTE — Telephone Encounter (Signed)
Pt request phone call from the assistant concern about the test that he had done. Please call him

## 2014-06-12 NOTE — Telephone Encounter (Signed)
Called pt and gave only the preliminary results.

## 2014-06-16 ENCOUNTER — Ambulatory Visit (INDEPENDENT_AMBULATORY_CARE_PROVIDER_SITE_OTHER): Payer: Medicare Other | Admitting: Physician Assistant

## 2014-06-16 ENCOUNTER — Encounter: Payer: Self-pay | Admitting: Physician Assistant

## 2014-06-16 ENCOUNTER — Encounter: Payer: Self-pay | Admitting: *Deleted

## 2014-06-16 VITALS — BP 120/82 | HR 77 | Ht 77.0 in | Wt 271.0 lb

## 2014-06-16 DIAGNOSIS — R079 Chest pain, unspecified: Secondary | ICD-10-CM

## 2014-06-16 DIAGNOSIS — I4892 Unspecified atrial flutter: Secondary | ICD-10-CM

## 2014-06-16 DIAGNOSIS — E119 Type 2 diabetes mellitus without complications: Secondary | ICD-10-CM

## 2014-06-16 DIAGNOSIS — R9439 Abnormal result of other cardiovascular function study: Secondary | ICD-10-CM

## 2014-06-16 DIAGNOSIS — I428 Other cardiomyopathies: Secondary | ICD-10-CM

## 2014-06-16 DIAGNOSIS — E785 Hyperlipidemia, unspecified: Secondary | ICD-10-CM

## 2014-06-16 DIAGNOSIS — I429 Cardiomyopathy, unspecified: Secondary | ICD-10-CM

## 2014-06-16 LAB — CBC WITH DIFFERENTIAL/PLATELET
BASOS ABS: 0.1 10*3/uL (ref 0.0–0.1)
Basophils Relative: 0.9 % (ref 0.0–3.0)
EOS ABS: 0.3 10*3/uL (ref 0.0–0.7)
EOS PCT: 3.2 % (ref 0.0–5.0)
HCT: 46.3 % (ref 39.0–52.0)
Hemoglobin: 15.2 g/dL (ref 13.0–17.0)
LYMPHS ABS: 2.1 10*3/uL (ref 0.7–4.0)
Lymphocytes Relative: 25.8 % (ref 12.0–46.0)
MCHC: 32.7 g/dL (ref 30.0–36.0)
MCV: 89.4 fl (ref 78.0–100.0)
MONO ABS: 0.8 10*3/uL (ref 0.1–1.0)
Monocytes Relative: 9.5 % (ref 3.0–12.0)
Neutro Abs: 5 10*3/uL (ref 1.4–7.7)
Neutrophils Relative %: 60.6 % (ref 43.0–77.0)
PLATELETS: 213 10*3/uL (ref 150.0–400.0)
RBC: 5.19 Mil/uL (ref 4.22–5.81)
RDW: 14.1 % (ref 11.5–15.5)
WBC: 8.2 10*3/uL (ref 4.0–10.5)

## 2014-06-16 LAB — BASIC METABOLIC PANEL
BUN: 19 mg/dL (ref 6–23)
CHLORIDE: 103 meq/L (ref 96–112)
CO2: 24 mEq/L (ref 19–32)
Calcium: 10.2 mg/dL (ref 8.4–10.5)
Creatinine, Ser: 1.2 mg/dL (ref 0.4–1.5)
GFR: 65.45 mL/min (ref >60.00)
GLUCOSE: 103 mg/dL — AB (ref 70–99)
Potassium: 4.1 mEq/L (ref 3.5–5.1)
Sodium: 139 mEq/L (ref 135–145)

## 2014-06-16 LAB — PROTIME-INR
INR: 1 ratio (ref 0.8–1.0)
Prothrombin Time: 10.9 s (ref 9.6–13.1)

## 2014-06-16 MED ORDER — NITROGLYCERIN 0.4 MG SL SUBL: 0.4000 mg | SUBLINGUAL_TABLET | SUBLINGUAL | Status: DC | PRN

## 2014-06-16 NOTE — H&P (Signed)
History and Physical    Date:  06/16/2014   ID:  Dennis Hill, DOB Dec 30, 1945, MRN [id number]  PCP:  [name], MD  Cardiologist:  Dr. Lauree Chandler  Electrophysiologist:  Dr. [name]    History of Present Illness: Dennis Hill is a 68 y.o. male with a hx of AFib/Flutter s/p AFlutter ablation in 02/2013, cardiomyopathy with EF 35-40% that improved to 45-50% with restoration of NSR, DM2, HL.  Eliquis was DC'd after his ablation.  Patient had fatigue with beta blocker Rx and this was changed to Diltiazem.  CCB was DC'd after ablation as well.  Last seen by Dr. [name] 03/2013.  Sleep study demonstrated just mild OSA.  FU echo demonstrated improved LVF.  He was recently seen by his PCP 05/29/14 with chest pain and referred back for further evaluation.  A Troponin was checked and this was normal.   I saw him 9/17.  He had one episode of chest heaviness with assoc L arm and neck discomfort.  I arranged a stress test and this returned abnormal with inferior scar and EF 41% (intermediate risk). He was brought back today to discuss proceeding with cardiac cath.  Since last seen, he has had one other episode of chest discomfort at rest.  No exertional symptoms.  He felt that making himself cough helped the symptoms resolve.  He denies dyspnea, syncope.     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 Inferior and apical HK   Recent Labs/Images: 09/05/2013: Direct LDL 87.2  05/29/2014: ALT 19; Creatinine 1.2; HDL Cholesterol by NMR 31.10*; Hemoglobin 15.4; LDL (calc) 102*; Potassium 4.7; TSH 1.80   Dg Chest 2 View  05/29/2014    IMPRESSION: No active cardiopulmonary disease.   Electronically Signed   By: [name]   On: 05/29/2014 11:19     Wt Readings from Last 3 Encounters:  06/16/14 271 lb (122.925 kg)  06/10/14 272 lb (123.378 kg)  06/04/14 272 lb 6.4 oz (123.56 kg)     Past Medical History  Diagnosis Date  . DIABETES MELLITUS, TYPE II 01/28/2010    diet controlled  . DEPRESSION 03/15/2009  . GERD 03/15/2009  . DIVERTICULOSIS, COLON 03/15/2009  . 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 FOOT PAIN, LEFT 04/05/2010  . RUQ PAIN 01/28/2010  . Hx of adenomatous colonic polyps 03/15/2009  . NEPHROLITHIASIS, HX OF 01/28/2010  . Anxiety   . Chronic insomnia   . Fatty liver   . Internal hemorrhoids   . Hyperlipidemia 02/10/2011  . Atrial bigeminy 2008    Dr. Caryl Comes  . Atrial fibrillation   . Atrial flutter   . Pneumonia ~ 1950  . Exertional shortness of breath     "lately" (03/06/2013)  . Sleep apnea   . RSWNIOEV(035.0)     "monthly" (03/06/2013)  . 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 BPH (benign prostatic hypertrophy) 03/21/2013  . Hx of cardiovascular stress test     ETT-Myoview (9/15):  Inferior infarct, no ischemia, EF 41% - Intermediate Risk    Current Outpatient Prescriptions  Medication Sig Dispense Refill  .  acetaminophen (TYLENOL) 500 MG tablet Take 500 mg by mouth every 6 (six) hours as needed for pain.      Marland Kitchen albuterol (PROVENTIL HFA;VENTOLIN HFA) 108 (90 BASE) MCG/ACT inhaler Inhale 2 puffs into the lungs every 6 (six) hours as needed for wheezing or shortness of breath.  1 Inhaler  5  . aspirin EC 81 MG tablet Take 1 tablet (81 mg total) by mouth daily.      . Azelastine-Fluticasone 137-50 MCG/ACT SUSP Place 1 spray into the nose as needed.      . cetirizine (ZYRTEC) 10 MG tablet Take 1 tablet (10 mg total) by mouth daily.  30 tablet  11  . esomeprazole (NEXIUM) 40 MG capsule Take 1 capsule (40 mg total) by mouth daily as needed (for stomach acid).  90 capsule  3  . fluticasone (FLONASE) 50  MCG/ACT nasal spray Place 2 sprays into both nostrils daily.  16 g  2  . tamsulosin (FLOMAX) 0.4 MG CAPS capsule        No current facility-administered medications for this visit.    Allergies:   Lisinopril and Xarelto   Social History:  The patient  reports that he quit smoking about 34 years ago. His smoking use included Cigarettes. He has a 13 pack-year smoking history. He has never used smokeless tobacco. He reports that he drinks about .6 ounces of alcohol per week. He reports that he does not use illicit drugs.   Family History:  The patient's family history includes Colon cancer in his mother; Lung cancer (age of onset: 23) in his mother. There is no history of CAD, Heart attack, or Stroke.   ROS:  Please see the history of present illness.    All other systems reviewed and negative.   PHYSICAL EXAM: VS:  BP 120/82  Pulse 77  Ht 6\' 5"  (1.956 m)  Wt 271 lb (122.925 kg)  BMI 32.13 kg/m2  SpO2 98% Well nourished, well developed, in no acute distress HEENT: normal Neck: no JVD Vascular:  No carotid bruits Cardiac:  normal S1, S2; RRR; no murmur Lungs:  clear to auscultation bilaterally, no wheezing, rhonchi or rales Abd: soft, nontender, no hepatomegaly Ext: no edema Skin: warm and dry Neuro:  CNs 2-12 intact, no focal abnormalities noted      ASSESSMENT AND PLAN:  1. Chest pain, unspecified chest pain type:  Symptoms with typical and atypical features.  Recent Myoview intermediate risk with inferior scar.  He has a risk equivalent of diabetes and reduced EF.  I have already reviewed with Dr. Lauree Chandler.  I have recommended Cardiac Cath.  Dr. Angelena Form agreed.  Risks and benefits of cardiac catheterization have been discussed with the patient.  These include bleeding, infection, kidney damage, stroke, heart attack, death.  The patient understands these risks and is willing to proceed.  Continue ASA.  Will give NTG prn Rx.  Consider statin based upon LHC results.     2. Atrial flutter, unspecified:  No recurrence.  Dr. Caryl Comes took him off of anticoagulation after his RFCA.   3. Cardiomyopathy:  EF was improved by recent echo.  Now EF down on Myoview.  Will wait for cath results.  May need to repeat echo.   4. Hyperlipidemia:  Managed by primary care.   5. Obstructive sleep apnea:  Mild by sleep study last year.  6. Type II or unspecified type diabetes mellitus without mention of complication, not stated as uncontrolled:  Well controlled on diet only.  Last A1c 6.6.  Disposition:  FU with me 2 weeks after cardiac cath.     Signed, Versie Starks, MHS 06/16/2014 10:31 AM    [facility] [place], [place],   [address] Phone: [phone]; Fax: [phone]     I have discussed this patient with Richardson Dopp, PA-C. Cath planned as above.  Graceanne Guin 06/17/2014 7:28 AM

## 2014-06-16 NOTE — Progress Notes (Signed)
Cardiology Office Note    Date:  06/16/2014   ID:  DELMAR ARRIAGA, DOB 09-02-1946, MRN [id number]  PCP:  [name], MD  Cardiologist:  Dr. Lauree Chandler  Electrophysiologist:  Dr. [name]    History of Present Illness: Dennis Hill is a 68 y.o. male with a hx of AFib/Flutter s/p AFlutter ablation in 02/2013, cardiomyopathy with EF 35-40% that improved to 45-50% with restoration of NSR, DM2, HL.  Eliquis was DC'd after his ablation.  Patient had fatigue with beta blocker Rx and this was changed to Diltiazem.  CCB was DC'd after ablation as well.  Last seen by Dr. [name] 03/2013.  Sleep study demonstrated just mild OSA.  FU echo demonstrated improved LVF.  He was recently seen by his PCP 05/29/14 with chest pain and referred back for further evaluation.  A Troponin was checked and this was normal.   I saw him 9/17.  He had one episode of chest heaviness with assoc L arm and neck discomfort.  I arranged a stress test and this returned abnormal with inferior scar and EF 41% (intermediate risk). He was brought back today to discuss proceeding with cardiac cath.  Since last seen, he has had one other episode of chest discomfort at rest.  No exertional symptoms.  He felt that making himself cough helped the symptoms resolve.  He denies dyspnea, syncope.     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 Inferior and apical HK   Recent Labs/Images: 09/05/2013: Direct LDL 87.2  05/29/2014: ALT 19; Creatinine 1.2; HDL Cholesterol by NMR 31.10*; Hemoglobin 15.4; LDL (calc) 102*; Potassium 4.7; TSH 1.80   Dg Chest 2 View   05/29/2014   IMPRESSION: No active cardiopulmonary disease.   Electronically Signed   By: [name]   On: 05/29/2014 11:19     Wt Readings from Last 3 Encounters:  06/16/14 271 lb (122.925 kg)  06/10/14 272 lb (123.378 kg)  06/04/14 272 lb 6.4 oz (123.56 kg)     Past Medical History  Diagnosis Date  . DIABETES MELLITUS, TYPE II 01/28/2010    diet controlled  . DEPRESSION 03/15/2009  . GERD 03/15/2009  . DIVERTICULOSIS, COLON 03/15/2009  . 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 FOOT PAIN, LEFT 04/05/2010  . RUQ PAIN 01/28/2010  . Hx of adenomatous colonic polyps 03/15/2009  . NEPHROLITHIASIS, HX OF 01/28/2010  . Anxiety   . Chronic insomnia   . Fatty liver   . Internal hemorrhoids   . Hyperlipidemia 02/10/2011  . Atrial bigeminy 2008    Dr. Caryl Comes  . Atrial fibrillation   . Atrial flutter   . Pneumonia ~ 1950  . Exertional shortness of breath     "lately" (03/06/2013)  . Sleep apnea   . IZTIWPYK(998.3)     "monthly" (03/06/2013)  . 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 BPH (benign prostatic hypertrophy) 03/21/2013  . Hx of cardiovascular stress test     ETT-Myoview (9/15):  Inferior infarct, no ischemia, EF 41% - Intermediate Risk    Current Outpatient Prescriptions  Medication Sig Dispense Refill  .  acetaminophen (TYLENOL) 500 MG tablet Take 500 mg by mouth every 6 (six) hours as needed for pain.      Marland Kitchen albuterol (PROVENTIL HFA;VENTOLIN HFA) 108 (90 BASE) MCG/ACT inhaler Inhale 2 puffs into the lungs every 6 (six) hours as needed for wheezing or shortness of breath.  1 Inhaler  5  . aspirin EC 81 MG tablet Take 1 tablet (81 mg total) by mouth daily.      . Azelastine-Fluticasone 137-50 MCG/ACT SUSP Place 1 spray into the nose as needed.      . cetirizine (ZYRTEC) 10 MG tablet Take 1 tablet (10 mg total) by mouth daily.  30 tablet  11  . esomeprazole (NEXIUM) 40 MG capsule Take 1 capsule (40 mg total) by mouth daily as needed (for stomach acid).  90 capsule  3  . fluticasone  (FLONASE) 50 MCG/ACT nasal spray Place 2 sprays into both nostrils daily.  16 g  2  . tamsulosin (FLOMAX) 0.4 MG CAPS capsule        No current facility-administered medications for this visit.    Allergies:   Lisinopril and Xarelto   Social History:  The patient  reports that he quit smoking about 34 years ago. His smoking use included Cigarettes. He has a 13 pack-year smoking history. He has never used smokeless tobacco. He reports that he drinks about .6 ounces of alcohol per week. He reports that he does not use illicit drugs.   Family History:  The patient's family history includes Colon cancer in his mother; Lung cancer (age of onset: 21) in his mother. There is no history of CAD, Heart attack, or Stroke.   ROS:  Please see the history of present illness.    All other systems reviewed and negative.   PHYSICAL EXAM: VS:  BP 120/82  Pulse 77  Ht 6\' 5"  (1.956 m)  Wt 271 lb (122.925 kg)  BMI 32.13 kg/m2  SpO2 98% Well nourished, well developed, in no acute distress HEENT: normal Neck: no JVD Vascular:  No carotid bruits Cardiac:  normal S1, S2; RRR; no murmur Lungs:  clear to auscultation bilaterally, no wheezing, rhonchi or rales Abd: soft, nontender, no hepatomegaly Ext: no edema Skin: warm and dry Neuro:  CNs 2-12 intact, no focal abnormalities noted      ASSESSMENT AND PLAN:  1. Chest pain, unspecified chest pain type:  Symptoms with typical and atypical features.  Recent Myoview intermediate risk with inferior scar.  He has a risk equivalent of diabetes and reduced EF.  I have already reviewed with Dr. Lauree Chandler.  I have recommended Cardiac Cath.  Dr. Angelena Form agreed.  Risks and benefits of cardiac catheterization have been discussed with the patient.  These include bleeding, infection, kidney damage, stroke, heart attack, death.  The patient understands these risks and is willing to proceed.  Continue ASA.  Will give NTG prn Rx.  Consider statin based upon LHC  results.    2. Atrial flutter, unspecified:  No recurrence.  Dr. Caryl Comes took him off of anticoagulation after his RFCA.   3. Cardiomyopathy:  EF was improved by recent echo.  Now EF down on Myoview.  Will wait for cath results.  May need to repeat echo.   4. Hyperlipidemia:  Managed by primary care.   5. Obstructive sleep apnea:  Mild by sleep study last year.  6. Type II or unspecified type diabetes mellitus without mention of complication, not stated as uncontrolled:  Well controlled on diet only.  Last A1c 6.6.  Disposition:  FU with me 2 weeks after cardiac cath.     Signed, Versie Starks, MHS 06/16/2014 10:31 AM    [facility] [place], [place], [place]  [address] Phone: [phone]; Fax: [phone]

## 2014-06-16 NOTE — Patient Instructions (Signed)
LAB WORK TODAY; BMET, CBC W/DIFF, PT/INR  YOU HAVE BEEN SCHEDULED FOR A LEFT HEART CATH WITH DR. Angelena Form 06/19/14 7:30 AM  AN RX FOR NTG HAS BEEN SENT INTO YOUR PHARMACY   YOU HAVE BEEN SCHEDULED FOR A FOLLOW UP 07/09/14 8:30 WITH [place], PAC

## 2014-06-17 ENCOUNTER — Encounter (HOSPITAL_COMMUNITY): Payer: Self-pay | Admitting: Pharmacy Technician

## 2014-06-17 ENCOUNTER — Telehealth: Payer: Self-pay | Admitting: *Deleted

## 2014-06-17 NOTE — Telephone Encounter (Signed)
pt notified about lab results ok for cath

## 2014-06-19 ENCOUNTER — Ambulatory Visit (HOSPITAL_COMMUNITY)
Admission: RE | Admit: 2014-06-19 | Discharge: 2014-06-19 | Disposition: A | Discharge status: Home / Self Care | Payer: Medicare Other | Source: Ambulatory Visit | Attending: Cardiovascular Disease | Admitting: Cardiovascular Disease

## 2014-06-19 ENCOUNTER — Encounter (HOSPITAL_COMMUNITY)
Admission: RE | Disposition: A | Discharge status: Home / Self Care | Payer: Self-pay | Source: Ambulatory Visit | Attending: Cardiovascular Disease

## 2014-06-19 DIAGNOSIS — R9439 Abnormal result of other cardiovascular function study: Secondary | ICD-10-CM | POA: Diagnosis present

## 2014-06-19 DIAGNOSIS — E119 Type 2 diabetes mellitus without complications: Secondary | ICD-10-CM | POA: Diagnosis not present

## 2014-06-19 DIAGNOSIS — R079 Chest pain, unspecified: Secondary | ICD-10-CM

## 2014-06-19 DIAGNOSIS — N528 Other male erectile dysfunction: Secondary | ICD-10-CM | POA: Insufficient documentation

## 2014-06-19 DIAGNOSIS — Z7982 Long term (current) use of aspirin: Secondary | ICD-10-CM | POA: Insufficient documentation

## 2014-06-19 DIAGNOSIS — Z87891 Personal history of nicotine dependence: Secondary | ICD-10-CM | POA: Insufficient documentation

## 2014-06-19 DIAGNOSIS — G4733 Obstructive sleep apnea (adult) (pediatric): Secondary | ICD-10-CM | POA: Diagnosis not present

## 2014-06-19 DIAGNOSIS — E785 Hyperlipidemia, unspecified: Secondary | ICD-10-CM | POA: Insufficient documentation

## 2014-06-19 DIAGNOSIS — I2510 Atherosclerotic heart disease of native coronary artery without angina pectoris: Secondary | ICD-10-CM | POA: Insufficient documentation

## 2014-06-19 DIAGNOSIS — I429 Cardiomyopathy, unspecified: Secondary | ICD-10-CM | POA: Insufficient documentation

## 2014-06-19 DIAGNOSIS — N400 Enlarged prostate without lower urinary tract symptoms: Secondary | ICD-10-CM | POA: Insufficient documentation

## 2014-06-19 DIAGNOSIS — K219 Gastro-esophageal reflux disease without esophagitis: Secondary | ICD-10-CM | POA: Diagnosis not present

## 2014-06-19 HISTORY — PX: LEFT HEART CATHETERIZATION WITH CORONARY ANGIOGRAM: SHX5451

## 2014-06-19 SURGERY — LEFT HEART CATHETERIZATION WITH CORONARY ANGIOGRAM
Anesthesia: LOCAL

## 2014-06-19 MED ORDER — FENTANYL CITRATE 0.05 MG/ML IJ SOLN
INTRAMUSCULAR | Status: AC
  Filled 2014-06-19: qty 2

## 2014-06-19 MED ORDER — SODIUM CHLORIDE 0.9 % IV SOLN
INTRAVENOUS | Status: DC
  Administered 2014-06-19: 06:00:00 via INTRAVENOUS

## 2014-06-19 MED ORDER — HEPARIN (PORCINE) IN NACL 2-0.9 UNIT/ML-% IJ SOLN
INTRAMUSCULAR | Status: AC
  Filled 2014-06-19: qty 500

## 2014-06-19 MED ORDER — ASPIRIN 81 MG PO CHEW
CHEWABLE_TABLET | ORAL | Status: AC
  Filled 2014-06-19: qty 1

## 2014-06-19 MED ORDER — SODIUM CHLORIDE 0.9 % IJ SOLN: 3.0000 mL | Freq: Two times a day (BID) | INTRAMUSCULAR | Status: DC

## 2014-06-19 MED ORDER — LIDOCAINE HCL (PF) 1 % IJ SOLN
INTRAMUSCULAR | Status: AC
  Filled 2014-06-19: qty 30

## 2014-06-19 MED ORDER — VERAPAMIL HCL 2.5 MG/ML IV SOLN
INTRAVENOUS | Status: AC
  Filled 2014-06-19: qty 2

## 2014-06-19 MED ORDER — SODIUM CHLORIDE 0.9 % IV SOLN: INTRAVENOUS | Status: AC

## 2014-06-19 MED ORDER — MIDAZOLAM HCL 2 MG/2ML IJ SOLN
INTRAMUSCULAR | Status: AC
  Filled 2014-06-19: qty 2

## 2014-06-19 MED ORDER — ASPIRIN 81 MG PO CHEW: 81.0000 mg | CHEWABLE_TABLET | ORAL | Status: DC

## 2014-06-19 MED ORDER — SODIUM CHLORIDE 0.9 % IJ SOLN: 3.0000 mL | INTRAMUSCULAR | Status: DC | PRN

## 2014-06-19 MED ORDER — HEPARIN SODIUM (PORCINE) 1000 UNIT/ML IJ SOLN
INTRAMUSCULAR | Status: AC
  Filled 2014-06-19: qty 1

## 2014-06-19 MED ORDER — SODIUM CHLORIDE 0.9 % IV SOLN: 250.0000 mL | INTRAVENOUS | Status: DC | PRN

## 2014-06-19 NOTE — Discharge Instructions (Signed)
Radial Site Care °Refer to this sheet in the next few weeks. These instructions provide you with information on caring for yourself after your procedure. Your caregiver may also give you more specific instructions. Your treatment has been planned according to current medical practices, but problems sometimes occur. Call your caregiver if you have any problems or questions after your procedure. °HOME CARE INSTRUCTIONS °· You may shower the day after the procedure. Remove the bandage (dressing) and gently wash the site with plain soap and water. Gently pat the site dry. °· Do not apply powder or lotion to the site. °· Do not submerge the affected site in water for 3 to 5 days. °· Inspect the site at least twice daily. °· Do not flex or bend the affected arm for 24 hours. °· No lifting over 5 pounds (2.3 kg) for 5 days after your procedure. °· Do not drive home if you are discharged the same day of the procedure. Have someone else drive you. °· You may drive 24 hours after the procedure unless otherwise instructed by your caregiver. °· Do not operate machinery or power tools for 24 hours. °· A responsible adult should be with you for the first 24 hours after you arrive home. °What to expect: °· Any bruising will usually fade within 1 to 2 weeks. °· Blood that collects in the tissue (hematoma) may be painful to the touch. It should usually decrease in size and tenderness within 1 to 2 weeks. °SEEK IMMEDIATE MEDICAL CARE IF: °· You have unusual pain at the radial site. °· You have redness, warmth, swelling, or pain at the radial site. °· You have drainage (other than a small amount of blood on the dressing). °· You have chills. °· You have a fever or persistent symptoms for more than 72 hours. °· You have a fever and your symptoms suddenly get worse. °· Your arm becomes pale, cool, tingly, or numb. °· You have heavy bleeding from the site. Hold pressure on the site. °Document Released: 10/07/2010 Document Revised:  11/27/2011 Document Reviewed: 10/07/2010 °ExitCare® Patient Information ©2015 [employer]. This information is not intended to replace advice given to you by your health care provider. Make sure you discuss any questions you have with your health care provider. ° °

## 2014-06-19 NOTE — CV Procedure (Signed)
      Cardiac Catheterization Operative Report  Dennis Hill [id number] 10/2/201510:01 AM [name], MD  Procedure Performed:  1. Left Heart Catheterization 2. Selective Coronary Angiography 3. Left ventricular angiogram  Operator: Lauree Chandler, MD  Arterial access site:  Right radial artery.   Indication: 68 yo male with history of atrial fib/flutter, DM, HLD, non-ischemic cardiomyopathy with recent chest pain. Stress myoview with possible inferior wall ischemia. LVEF known to be around 40%.                                       Procedure Details: The risks, benefits, complications, treatment options, and expected outcomes were discussed with the patient. The patient and/or family concurred with the proposed plan, giving informed consent. The patient was brought to the cath lab after IV hydration was begun and oral premedication was given. The patient was further sedated with Versed and Fentanyl. The right wrist was assessed with a reverse Allens test which was positive. The right wrist was prepped and draped in a sterile fashion. 1% lidocaine was used for local anesthesia. Using the modified Seldinger access technique, a 5 French sheath was placed in the right radial artery. 3 mg Verapamil was given through the sheath. 6000 units IV heparin was given. Standard diagnostic catheters were used to perform selective coronary angiography. A pigtail catheter was used to perform a left ventricular angiogram. The sheath was removed from the right radial artery and a Terumo hemostasis band was applied at the arteriotomy site on the right wrist.    There were no immediate complications. The patient was taken to the recovery area in stable condition.   Hemodynamic Findings: Central aortic pressure: 145/83 Left ventricular pressure: 135/6/9  Angiographic Findings:  Left main: No obstructive disease.   Left Anterior Descending Artery: Moderate caliber vessel that courses to the  apex. Proximal 20% stenosis. Luminal irregularities in the mid and distal vessel.   Circumflex Artery: Moderate caliber vessel with small caliber obtuse marginal branch. 20% mid stenosis. Moderate caliber intermediate branch with no obstructive disease.   Right Coronary Artery: Large dominant vessel with 20% stenoses in the proximal, mid and distal vessel. The PDA is small in caliber with 20% stenosis.   Left Ventricular Angiogram: LVEF=40%.   Impression: 1. Mild non-obstructive CAD 2. Mild LV systolic dysfunction  Recommendations: Medical management of CAD. Will start low dose statin.        Complications:  None. The patient tolerated the procedure well.

## 2014-06-19 NOTE — Interval H&P Note (Signed)
History and Physical Interval Note:  06/19/2014 9:37 AM  Dennis Hill  has presented today for surgery, with the diagnosis of abnormal stress/cp  The various methods of treatment have been discussed with the patient and family. After consideration of risks, benefits and other options for treatment, the patient has consented to  Procedure(s): LEFT HEART CATHETERIZATION WITH CORONARY ANGIOGRAM (N/A) as a surgical intervention .  The patient's history has been reviewed, patient examined, no change in status, stable for surgery.  I have reviewed the patient's chart and labs.  Questions were answered to the patient's satisfaction.    Cath Lab Visit (complete for each Cath Lab visit)  Clinical Evaluation Leading to the Procedure:   ACS: No.  Non-ACS:    Anginal Classification: CCS II  Anti-ischemic medical therapy: No Therapy  Non-Invasive Test Results: Intermediate-risk stress test findings: cardiac mortality 1-3%/year  Prior CABG: No previous CABG        [name]

## 2014-06-29 ENCOUNTER — Telehealth: Payer: Self-pay

## 2014-06-29 NOTE — Telephone Encounter (Signed)
Pt stated that he has had an eye exam this year at [facility] and that it was okay to request a copy of eye exam.  Called the [facility] and they stated that they would fax over a copy once the patient agrees over the phone that the report can be faxed.  Called pt and made him aware. He stated that he would call and have them fax it.

## 2014-07-09 ENCOUNTER — Encounter: Payer: Self-pay | Admitting: Physician Assistant

## 2014-07-09 ENCOUNTER — Ambulatory Visit (INDEPENDENT_AMBULATORY_CARE_PROVIDER_SITE_OTHER): Payer: Medicare Other | Admitting: Physician Assistant

## 2014-07-09 VITALS — BP 142/82 | HR 66 | Ht 77.0 in | Wt 274.0 lb

## 2014-07-09 DIAGNOSIS — I2510 Atherosclerotic heart disease of native coronary artery without angina pectoris: Secondary | ICD-10-CM

## 2014-07-09 DIAGNOSIS — I4892 Unspecified atrial flutter: Secondary | ICD-10-CM

## 2014-07-09 DIAGNOSIS — I429 Cardiomyopathy, unspecified: Secondary | ICD-10-CM

## 2014-07-09 DIAGNOSIS — G4733 Obstructive sleep apnea (adult) (pediatric): Secondary | ICD-10-CM

## 2014-07-09 DIAGNOSIS — E785 Hyperlipidemia, unspecified: Secondary | ICD-10-CM

## 2014-07-09 MED ORDER — ATORVASTATIN CALCIUM 10 MG PO TABS: ORAL_TABLET | ORAL | Status: DC

## 2014-07-09 MED ORDER — METOPROLOL SUCCINATE ER 25 MG PO TB24: 12.5000 mg | ORAL_TABLET | Freq: Every day | ORAL | Status: DC

## 2014-07-09 NOTE — Progress Notes (Signed)
Cardiology Office Note   Date:  07/09/2014   ID:  Dennis Hill, DOB [birth year], MRN [id number]  PCP:  [name], MD  Cardiologist:   Dr. [name]    History of Present Illness: [name] is a 68 y.o. male with a hx of AFib/Flutter s/p AFlutter ablation in 02/2013, cardiomyopathy with EF 35-40% that improved to 45-50% with restoration of NSR, DM2, HL.  Eliquis was DC'd after his ablation.  Patient had fatigue with beta blocker Rx and this was changed to Diltiazem.  CCB was DC'd after ablation as well.  Last seen by Dr. [name] 03/2013.  Sleep study demonstrated just mild OSA.  FU echo demonstrated improved LVF.  He was recently seen by his PCP with chest pain and referred back for further evaluation.  A stress test returned abnormal with inferior scar and EF 41% (intermediate risk).  He was therefore set up for cardiac cath.  This demonstrated mild non-obstructive CAD, EF 40%.  He was placed on low dose statin. He returns for FU.    He has had SEs to the Lipitor since starting with myalgias, fatigue, tongue thickness.  He has some DOE with more extreme activities.  He is NYHA 2.  He denies orthopnea, PND, edema.  No syncope.   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 Inferior and apical HK  - LHC (10/15):  pLAD 20%, mCFX 20%, pRCA 20%, PDA 20%, EF 40%   Recent Labs/Images: 09/05/2013: Direct LDL 87.2  05/29/2014: ALT 19; HDL Cholesterol by NMR 31.10*; LDL (calc) 102*; TSH 1.80  06/16/2014: Creatinine 1.2; Hemoglobin 15.2; Potassium 4.1   Dg Chest 2 View  05/29/2014   IMPRESSION: No active cardiopulmonary disease.    Electronically Signed   By: [name]   On: 05/29/2014 11:19     Wt Readings from Last 3 Encounters:  07/09/14 274 lb (124.286 kg)  06/19/14 271 lb (122.925 kg)  06/19/14 271 lb (122.925 kg)     Past Medical History  Diagnosis Date  . DIABETES MELLITUS, TYPE II 01/28/2010    diet controlled  . DEPRESSION 03/15/2009  . GERD 03/15/2009  . DIVERTICULOSIS, COLON 03/15/2009  . 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 FOOT PAIN, LEFT 04/05/2010  . RUQ PAIN 01/28/2010  . Hx of adenomatous colonic polyps 03/15/2009  . NEPHROLITHIASIS, HX OF 01/28/2010  . Anxiety   . Chronic insomnia   . Fatty liver   . Internal hemorrhoids   . Hyperlipidemia 02/10/2011  . Atrial bigeminy 2008    Dr. Caryl Comes  . Atrial fibrillation   . Atrial flutter   . Pneumonia ~ 1950  . Exertional shortness of breath     "lately" (03/06/2013)  . Sleep apnea   . DQQIWLNL(892.1)     "monthly" (03/06/2013)  . 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 BPH (benign prostatic hypertrophy) 03/21/2013  . Hx of cardiovascular stress test     ETT-Myoview (9/15):  Inferior infarct, no ischemia, EF 41% - Intermediate Risk    Current Outpatient Prescriptions  Medication Sig Dispense Refill  . acetaminophen (TYLENOL) 500  MG tablet Take 500 mg by mouth every 6 (six) hours as needed for pain.      Marland Kitchen albuterol (PROVENTIL HFA;VENTOLIN HFA) 108 (90 BASE) MCG/ACT inhaler Inhale 2 puffs into the lungs every 6 (six) hours as needed for wheezing or shortness of breath.  1 Inhaler  5  . aspirin EC 81 MG tablet Take 1 tablet (81 mg total) by mouth daily.      Marland Kitchen atorvastatin (LIPITOR) 10 MG tablet Take 10 mg by mouth daily.       . Cyanocobalamin (VITAMIN B-12 PO) Take 1 tablet by mouth daily.      Marland Kitchen esomeprazole (NEXIUM) 40 MG capsule Take 1 capsule (40 mg total) by mouth daily as needed (for stomach acid).  90 capsule  3  . fluticasone (FLONASE) 50 MCG/ACT nasal spray Place 2 sprays into both nostrils daily.  16 g  2  . nitroGLYCERIN (NITROSTAT)  0.4 MG SL tablet Place 1 tablet (0.4 mg total) under the tongue every 5 (five) minutes as needed for chest pain.  25 tablet  3  . Probiotic Product (PROBIOTIC PO) Take 1 capsule by mouth daily.      . Pyridoxine HCl (B-6 PO) Take 1 tablet by mouth daily.      . SELENIUM PO Take 1 tablet by mouth daily.      . tamsulosin (FLOMAX) 0.4 MG CAPS capsule Take 0.4 mg by mouth every other day.        No current facility-administered medications for this visit.    Allergies:   Lisinopril and Xarelto   Social History:  The patient  reports that he quit smoking about 34 years ago. His smoking use included Cigarettes. He has a 13 pack-year smoking history. He has never used smokeless tobacco. He reports that he drinks about .6 ounces of alcohol per week. He reports that he does not use illicit drugs.   Family History:  The patient's family history includes Cancer in his maternal grandmother and mother; Colon cancer in his mother; Lung cancer (age of onset: 68) in his mother. There is no history of CAD, Heart attack, or Stroke.   ROS:  Please see the history of present illness.    All other systems reviewed and negative.   PHYSICAL EXAM: VS:  BP 142/82  Pulse 66  Ht 6\' 5"  (1.956 m)  Wt 274 lb (124.286 kg)  BMI 32.49 kg/m2 Well nourished, well developed, in no acute distress HEENT: normal Neck: no JVD Cardiac:  normal S1, S2; RRR; no murmur Lungs:  clear to auscultation bilaterally, no wheezing, rhonchi or rales Abd: soft, nontender, no hepatomegaly Ext: no edema Skin: warm and dry Neuro:  CNs 2-12 intact, no focal abnormalities noted     EKG:  NSR, HR 66, NSSTTW changes  ASSESSMENT AND PLAN:  CAD:   Non-obstructive disease on cath.  Continue ASA.  Statin was started at cath.   He has had some SEs to this.   Atrial flutter, unspecified:  No recurrence.  Dr. Caryl Comes took him off of anticoagulation after his RFCA.    Cardiomyopathy:  EF 40% by LV-gram at [facility].  He had intol to beta blocker in  the past with fatigue.  He has ACEI listed as an allergy with "tongue swelling."  He is intolerant of several medications.  No evidence of volume excess.    -  Check Echo to get better reading on EF.      -  Try Toprol-XL 12.5 mg QHS.  Can start this 2 weeks after stopping Lipitor.    -  We discussed tx of his cardiomyopathy.  Consider trial of Hydralazine if he can tolerate the Toprol-XL.      Hyperlipidemia:   Hold Lipitor due to SEs.  After he has been on Toprol-XL for a few weeks, he can try Lipitor 3x per week.  Obstructive sleep apnea:  Mild by sleep study last year.   Type II or unspecified type diabetes mellitus without mention of complication, not stated as uncontrolled:  Well controlled on diet only.  Last A1c 6.6.  Disposition:  FU with Dr. [name] 6-8 weeks.      Signed, Versie Starks, MHS 07/09/2014 8:52 AM    [facility] [place], [place], [place]  [address] Phone: [phone]; Fax: [phone]

## 2014-07-09 NOTE — Patient Instructions (Signed)
Your physician has requested that you have an echocardiogram. Echocardiography is a painless test that uses sound waves to create images of your heart. It provides your doctor with information about the size and shape of your heart and how well your heart's chambers and valves are working. This procedure takes approximately one hour. There are no restrictions for this procedure.  Your physician recommends that you schedule a follow-up appointment in: [place]  Your physician has recommended you make the following change in your medication:  1. HOLD LIPITOR 2. START METOPROLOL 25 MG TABLET; TAKE 1/2 TABLET AT BEDTIME = 12.5 MG ; IF DOING OK AFTER 2 WEEKS ON METOPROLOL THEN GO BACK ON LIPITOR 3 TIMES A WEEK

## 2014-07-10 ENCOUNTER — Telehealth: Payer: Self-pay | Admitting: Cardiovascular Disease

## 2014-07-10 ENCOUNTER — Ambulatory Visit: Payer: Medicare Other | Admitting: Internal Medicine

## 2014-07-10 ENCOUNTER — Ambulatory Visit (HOSPITAL_COMMUNITY): Payer: Medicare Other | Attending: Physician Assistant | Admitting: Cardiology

## 2014-07-10 ENCOUNTER — Encounter: Payer: Self-pay | Admitting: Physician Assistant

## 2014-07-10 DIAGNOSIS — I429 Cardiomyopathy, unspecified: Secondary | ICD-10-CM | POA: Diagnosis present

## 2014-07-10 DIAGNOSIS — E785 Hyperlipidemia, unspecified: Secondary | ICD-10-CM | POA: Diagnosis not present

## 2014-07-10 DIAGNOSIS — E119 Type 2 diabetes mellitus without complications: Secondary | ICD-10-CM | POA: Diagnosis not present

## 2014-07-10 DIAGNOSIS — I2510 Atherosclerotic heart disease of native coronary artery without angina pectoris: Secondary | ICD-10-CM

## 2014-07-10 NOTE — Telephone Encounter (Signed)
Spoke with pt and reviewed echo results and recommendations from [place], [place] with him. He has not started Toprol yet but will pick up today at [facility].  Follow up appt made with Richardson Dopp, PA for July 30, 2014 at 8:50.

## 2014-07-10 NOTE — Telephone Encounter (Signed)
New message    Patient calling stating someone called him just now did not leave a name

## 2014-07-10 NOTE — Progress Notes (Signed)
Echo performed. 

## 2014-07-16 ENCOUNTER — Encounter: Payer: Self-pay | Admitting: Internal Medicine

## 2014-07-30 ENCOUNTER — Ambulatory Visit (INDEPENDENT_AMBULATORY_CARE_PROVIDER_SITE_OTHER): Payer: Medicare Other | Admitting: Physician Assistant

## 2014-07-30 ENCOUNTER — Encounter: Payer: Self-pay | Admitting: Physician Assistant

## 2014-07-30 VITALS — BP 121/80 | HR 73 | Ht 77.0 in | Wt 272.0 lb

## 2014-07-30 DIAGNOSIS — G4733 Obstructive sleep apnea (adult) (pediatric): Secondary | ICD-10-CM

## 2014-07-30 DIAGNOSIS — E785 Hyperlipidemia, unspecified: Secondary | ICD-10-CM

## 2014-07-30 DIAGNOSIS — I429 Cardiomyopathy, unspecified: Secondary | ICD-10-CM

## 2014-07-30 DIAGNOSIS — I2510 Atherosclerotic heart disease of native coronary artery without angina pectoris: Secondary | ICD-10-CM

## 2014-07-30 DIAGNOSIS — I428 Other cardiomyopathies: Secondary | ICD-10-CM

## 2014-07-30 DIAGNOSIS — I4892 Unspecified atrial flutter: Secondary | ICD-10-CM

## 2014-07-30 MED ORDER — HYDRALAZINE HCL 10 MG PO TABS: 10.0000 mg | ORAL_TABLET | Freq: Two times a day (BID) | ORAL | Status: DC

## 2014-07-30 MED ORDER — METOPROLOL SUCCINATE ER 25 MG PO TB24: 25.0000 mg | ORAL_TABLET | Freq: Every day | ORAL | Status: DC

## 2014-07-30 NOTE — Patient Instructions (Signed)
Go ahead and try to increase Toprol-XL to 25 mg daily. In 2 weeks, start taking Hydralazine 10 mg twice a day (about 12 hours apart). Keep your follow up with Dr. [name] in December as planned.

## 2014-07-30 NOTE — Progress Notes (Signed)
Cardiology Office Note   Date:  07/30/2014   ID:  Dennis Hill, DOB 12/08/1945, MRN [id number]  PCP:  [name], MD  Cardiologist:   Dr. [name]    History of Present Illness: Dennis Hill is a 68 y.o. male with a hx of AFib/Flutter s/p AFlutter ablation in 02/2013, cardiomyopathy with EF 35-40% that improved to 45-50% with restoration of NSR, DM2, HL.  Eliquis was DC'd after his ablation.  Patient had fatigue with beta blocker Rx and this was changed to Diltiazem.  CCB was DC'd after ablation as well.  Last seen by Dr. [name] 03/2013.  Sleep study demonstrated just mild OSA.  FU echo demonstrated improved LVF.  He was recently seen by his PCP with chest pain and referred back for further evaluation.  A stress test returned abnormal with inferior scar and EF 41% (intermediate risk).  He was therefore set up for cardiac cath which demonstrated mild non-obstructive CAD, EF 40%.    I saw him 10/22 in FU.  Statin was held due to myalgias and fatigue.  Low dose beta blocker was added.  FU echo demonstrated EF 35-40%.  He returns for further medication titration for NICM.  He is doing well.  He has DOE with more extreme activities.  He is NYHA 2.  He denies orthopnea, PND, edema.  No chest pain.  No syncope.    Studies:  - Echo (6/14):  EF 35-40%   - Echo (8/14):  EF 45-50%, mildly dilated Ao root (40 mm)  - Echo (10/15):  Mild LVH.  EF 35% to 40%.  Diffuse HK   - EPS with LA mapping, arrhtymia mapping and RFCA of SVT (03/06/13)  - Myoview (06/11/14):   Intermediate risk stress nuclear study Moderate sized inferior wall infarct from apex to base with decreased EF and ECG changes with exercise:  EF 41%. Inferior and apical HK  - LHC (10/15):  pLAD 20%, mCFX 20%, pRCA 20%, PDA 20%, EF 40%   Recent Labs/Images: 09/05/2013: Direct LDL 87.2 05/29/2014: ALT 19; HDL Cholesterol by NMR 31.10*; LDL (calc) 102*; TSH 1.80 06/16/2014: Creatinine 1.2; Hemoglobin 15.2; Potassium 4.1    Wt Readings from Last 3 Encounters:  07/09/14 274 lb (124.286 kg)  06/19/14 271 lb (122.925 kg)  06/16/14 271 lb (122.925 kg)     Past Medical History  Diagnosis Date  . DIABETES MELLITUS, TYPE II 01/28/2010    diet controlled  . DEPRESSION 03/15/2009  . GERD 03/15/2009  . DIVERTICULOSIS, COLON 03/15/2009  . 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 Hx of adenomatous colonic polyps 03/15/2009  . NEPHROLITHIASIS, HX OF 01/28/2010  . Anxiety   . Chronic insomnia   . Fatty liver   . Internal hemorrhoids   . Hyperlipidemia 02/10/2011  . Atrial bigeminy 2008    Dr. Caryl Comes  . Atrial fibrillation   . Atrial flutter   . Pneumonia ~ 1950  . Sleep apnea   . GUYQIHKV(425.9)     "monthly" (03/06/2013)  . BPH (benign prostatic hypertrophy) 03/21/2013  . Hx of cardiovascular stress test     ETT-Myoview (9/15):  Inferior infarct, no ischemia, EF 41% - Intermediate Risk  . CAD (coronary artery disease)     LHC (10/15):  pLAD 20%, mCFX 20%, pRCA 20%, PDA 20%, EF 40%  . NICM (nonischemic cardiomyopathy)     Echo (10/15):  Mild LVH, EF 35-40%, diff HK    Current Outpatient Prescriptions  Medication Sig Dispense Refill  .  acetaminophen (TYLENOL) 500 MG tablet Take 500 mg by mouth every 6 (six) hours as needed for pain.    Marland Kitchen albuterol (PROVENTIL HFA;VENTOLIN HFA) 108 (90 BASE) MCG/ACT inhaler Inhale 2 puffs into the lungs every 6 (six) hours as needed for wheezing or shortness of breath. 1 Inhaler 5  . aspirin EC 81 MG tablet Take 1 tablet (81 mg total) by mouth daily.    Marland Kitchen atorvastatin (LIPITOR) 10 MG tablet HOLD    . Cyanocobalamin (VITAMIN B-12 PO) Take 1 tablet by mouth daily.    Marland Kitchen esomeprazole (NEXIUM) 40 MG capsule Take 1 capsule (40 mg total) by mouth daily as needed (for stomach acid). 90 capsule 3  . fluticasone (FLONASE) 50 MCG/ACT nasal spray Place 2 sprays into both nostrils daily. 16 g 2  . metoprolol succinate (TOPROL-XL) 25 MG 24 hr tablet Take 0.5 tablets (12.5 mg total) by mouth  daily. 30 tablet 11  . nitroGLYCERIN (NITROSTAT) 0.4 MG SL tablet Place 1 tablet (0.4 mg total) under the tongue every 5 (five) minutes as needed for chest pain. 25 tablet 3  . Probiotic Product (PROBIOTIC PO) Take 1 capsule by mouth daily.    . Pyridoxine HCl (B-6 PO) Take 1 tablet by mouth daily.    . SELENIUM PO Take 1 tablet by mouth daily.    . tamsulosin (FLOMAX) 0.4 MG CAPS capsule Take 0.4 mg by mouth every other day.      No current facility-administered medications for this visit.    Allergies:   Lisinopril and Xarelto   Social History:  The patient  reports that he quit smoking about 34 years ago. His smoking use included Cigarettes. He has a 13 pack-year smoking history. He has never used smokeless tobacco. He reports that he drinks about 0.6 oz of alcohol per week. He reports that he does not use illicit drugs.   Family History:  The patient's family history includes Cancer in his maternal grandmother and mother; Colon cancer in his mother; Lung cancer (age of onset: 65) in his mother. There is no history of CAD, Heart attack, or Stroke.   ROS:  Please see the history of present illness. He has a NP cough.  He uses prn Albuterol for wheezing.   All other systems reviewed and negative.   PHYSICAL EXAM: VS:  BP 121/80 mmHg  Pulse 73  Ht 6\' 5"  (1.956 m)  Wt 272 lb (123.378 kg)  BMI 32.25 kg/m2 Well nourished, well developed, in no acute distress HEENT: normal Neck: no JVD Cardiac:  normal S1, S2;  RRR; no  Murmur  Lungs:   clear to auscultation bilaterally, no wheezing, rhonchi or rales Abd: soft, nontender, no hepatomegaly Ext:  no edema Skin: warm and dry Neuro:  CNs 2-12 intact, no focal abnormalities noted     EKG:  NSR , HR 73, normal axis, NSSTTW changes  ASSESSMENT AND PLAN:  1.  NICM:  EF 35-40% by echo.  He is tolerating the Toprol.  He is NYHA 2.  No signs of CHF.  He would like to increase this to 25 mg QD.     -  Increase Toprol-XL to 25 mg QD.    -  Try  Hydralazine 10 mg bid - start in 2 weeks.     -  Eventually try to start nitrates (he does not take PDE-5 inhibitors).  Consider Spironolactone. 2.  CAD:   Non-obstructive disease on cath.  Continue ASA.  Eventually try to resume statin. 3.  Atrial flutter, unspecified:   No recurrence.  Dr. Caryl Comes took him off of anticoagulation after his RFCA.   4.  Hyperlipidemia:   He is holding his statin due to side effects.  He is somewhat sensitive to medications.  I would suggest resuming this at 2-3 x a week after his HF medications are titrated. 5.  Obstructive sleep apnea:  Mild by sleep study last year.  6.  Type II or unspecified type diabetes mellitus without mention of complication, not stated as uncontrolled:  Well controlled on diet only.  Last A1c 6.6.  Disposition:  FU with Dr. [name] December as planned.      Signed, Versie Starks, MHS 07/30/2014 8:00 AM    [facility] [place], [place], [place]  [address] Phone: [phone]; Fax: [phone]

## 2014-08-27 ENCOUNTER — Encounter (HOSPITAL_COMMUNITY): Payer: Self-pay | Admitting: Internal Medicine

## 2014-08-28 ENCOUNTER — Ambulatory Visit: Payer: Medicare Other | Admitting: Internal Medicine

## 2014-09-15 ENCOUNTER — Encounter: Payer: Self-pay | Admitting: Internal Medicine

## 2014-10-08 ENCOUNTER — Ambulatory Visit (INDEPENDENT_AMBULATORY_CARE_PROVIDER_SITE_OTHER): Payer: Medicare Other | Admitting: Internal Medicine

## 2014-10-08 ENCOUNTER — Encounter: Payer: Self-pay | Admitting: Internal Medicine

## 2014-10-08 VITALS — BP 118/78 | HR 63 | Ht 77.0 in | Wt 274.0 lb

## 2014-10-08 DIAGNOSIS — R410 Disorientation, unspecified: Secondary | ICD-10-CM

## 2014-10-08 DIAGNOSIS — I428 Other cardiomyopathies: Secondary | ICD-10-CM

## 2014-10-08 DIAGNOSIS — I429 Cardiomyopathy, unspecified: Secondary | ICD-10-CM

## 2014-10-08 DIAGNOSIS — I4892 Unspecified atrial flutter: Secondary | ICD-10-CM

## 2014-10-08 NOTE — Patient Instructions (Signed)
Your physician has recommended you make the following change in your medication:  1) STOP Metoprolol  Your physician recommends that you schedule a follow-up appointment in: 3 months with Dr. Caryl Comes

## 2014-10-08 NOTE — Progress Notes (Signed)
Patient Care Team: Biagio Borg, MD as PCP - General   HPI  Dennis Hill is a 69 y.o. male  seen in followup for a flutter ablation undertaking June 2014. His ejection fraction was 35-45%. He is much improved following restoration of sinus rhythm with improved energy.  He has a history of snoring. He has daytime somnolence. He has daytime fatigue  sllepstudy had an AHI 13.5  Myoview scanning 9/15 demonstrated a prior inferior wall infarct with an LVEF of 41%. Because of chest pain and associated ischemia, he underwent catheterization 10/5. LVEF was also 40% no obstructive coronary disease was noted.  He notices that his clarity of thought is variable. He missed his metoprolol for a couple of days and seemed to be much better.          Past Medical History  Diagnosis Date  . DIABETES MELLITUS, TYPE II 01/28/2010    diet controlled  . DEPRESSION 03/15/2009  . GERD 03/15/2009  . DIVERTICULOSIS, COLON 03/15/2009  . 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 Hx of adenomatous colonic polyps 03/15/2009  . NEPHROLITHIASIS, HX OF 01/28/2010  . Anxiety   . Chronic insomnia   . Fatty liver   . Internal hemorrhoids   . Hyperlipidemia 02/10/2011  . Atrial bigeminy 2008    Dr. Caryl Comes  . Atrial fibrillation   . Atrial flutter   . Pneumonia ~ 1950  . Sleep apnea   . DGLOVFIE(332.9)     "monthly" (03/06/2013)  . BPH (benign prostatic hypertrophy) 03/21/2013  . Hx of cardiovascular stress test     ETT-Myoview (9/15):  Inferior infarct, no ischemia, EF 41% - Intermediate Risk  . CAD (coronary artery disease)     LHC (10/15):  pLAD 20%, mCFX 20%, pRCA 20%, PDA 20%, EF 40%  . NICM (nonischemic cardiomyopathy)     Echo (10/15):  Mild LVH, EF 35-40%, diff HK    Past Surgical History  Procedure Laterality Date  . Appendectomy    . Cholecystectomy  2006  . Tonsillectomy and adenoidectomy  ~ 1952  . Inguinal hernia repair Right 1986  . Cardioversion N/A 02/06/2013    Procedure:  CARDIOVERSION;  Surgeon: Evans Lance, MD;  Location: [facility];  Service: Cardiovascular;  Laterality: N/A;  . Tee without cardioversion N/A 03/05/2013    Procedure: TRANSESOPHAGEAL ECHOCARDIOGRAM (TEE);  Surgeon: Larey Dresser, MD;  Location: [facility];  Service: Cardiovascular;  Laterality: N/A;  Jeannene Patella Greggory Brandy  . Atrial flutter ablation  03/06/2013  . Atrial flutter ablation N/A 03/06/2013    Procedure: ATRIAL FLUTTER ABLATION;  Surgeon: Deboraha Sprang, MD;  Location: [facility] CATH LAB;  Service: Cardiovascular;  Laterality: N/A;  . Left heart catheterization with coronary angiogram N/A 06/19/2014    Procedure: LEFT HEART CATHETERIZATION WITH CORONARY ANGIOGRAM;  Surgeon: Burnell Blanks, MD;  Location: [facility] C[facility] LAB;  Service: Cardiovascular;  Laterality: N/A;    Current Outpatient Prescriptions  Medication Sig Dispense Refill  . acetaminophen (TYLENOL) 500 MG tablet Take 500 mg by mouth every 6 (six) hours as needed for pain.    Marland Kitchen albuterol (PROVENTIL HFA;VENTOLIN HFA) 108 (90 BASE) MCG/ACT inhaler Inhale 2 puffs into the lungs every 6 (six) hours as needed for wheezing or shortness of breath. 1 Inhaler 5  . atorvastatin (LIPITOR) 10 MG tablet HOLD    . Cyanocobalamin (VITAMIN B-12 PO) Take 1 tablet by mouth daily.    Marland Kitchen esomeprazole (NEXIUM) 40 MG capsule Take 1 capsule (40 mg total) by mouth  daily as needed (for stomach acid). 90 capsule 3  . fluticasone (FLONASE) 50 MCG/ACT nasal spray Place 2 sprays into both nostrils daily. 16 g 2  . metoprolol succinate (TOPROL-XL) 25 MG 24 hr tablet Take 1 tablet (25 mg total) by mouth daily. (Patient taking differently: Take 12.5 mg by mouth daily. ) 30 tablet 0  . nitroGLYCERIN (NITROSTAT) 0.4 MG SL tablet Place 1 tablet (0.4 mg total) under the tongue every 5 (five) minutes as needed for chest pain. 25 tablet 3  . Probiotic Product (PROBIOTIC PO) Take 1 capsule by mouth daily.    . Pyridoxine HCl (B-6 PO) Take 1 tablet by mouth daily.    . SELENIUM  PO Take 1 tablet by mouth daily.    . tamsulosin (FLOMAX) 0.4 MG CAPS capsule Take 0.4 mg by mouth every other day.      No current facility-administered medications for this visit.    Allergies  Allergen Reactions  . Lisinopril Other (See Comments)    Tongue swelling  . Xarelto [Rivaroxaban]     Tongue swelling    Review of Systems negative except from HPI and PMH  Physical Exam BP 118/78 mmHg  Pulse 63  Ht 6\' 5"  (1.956 m)  Wt 274 lb (124.286 kg)  BMI 32.49 kg/m2 Well developed and nourished in no acute distress HENT normal Neck supple with JVP-flat Carotids brisk and full without bruits Clear Regular rate and rhythm, no murmurs or gallops Abd-soft with active BS without hepatomegaly No Clubbing cyanosis edema Skin-warm and dry A & Oriented  Grossly normal sensory and motor function  ECG demonstrates sinus rhythm at 66 Interval 17/10/37 Axis is8 Nonspecific ST changes  Assessment and  Plan Nonischemic cardio myopathy  Congestive heart failure-chronic-systolic  Mental fogginess  He wonders whether his mental fogginess is related to his medications. We had a long discussion regarding the relative value of Ace inhibitors and beta blockers in left ventricular dysfunction greater than 40%.  For right now we will stop his metoprolol. With his history of angioedema we will avoid Ace inhibitors and ARB's. There may be a role for Aldactone. He was unable to tolerate hydralazine.  He is euvolemic.   We spent more than 50% of our >25 min visit in face to face counseling regarding the above

## 2014-10-08 NOTE — Addendum Note (Signed)
Addended by: Stanton Kidney on: 10/08/2014 01:31 PM   Modules accepted: Orders, Medications, Level of Service

## 2014-10-09 ENCOUNTER — Ambulatory Visit: Payer: Medicare Other | Admitting: Internal Medicine

## 2014-11-06 ENCOUNTER — Telehealth: Payer: Self-pay | Admitting: Internal Medicine

## 2014-11-06 NOTE — Telephone Encounter (Signed)
Patient asking for a refill of   nitroGLYCERIN (NITROSTAT) 0.4 MG SL tablet      . He has an RX, and it was left in his laundry and washed. The pills melted so now he is without. Advised patient that he may need visit.

## 2014-11-09 ENCOUNTER — Other Ambulatory Visit: Payer: Self-pay

## 2014-11-09 ENCOUNTER — Other Ambulatory Visit: Payer: Self-pay | Admitting: Internal Medicine

## 2014-11-09 DIAGNOSIS — I4892 Unspecified atrial flutter: Secondary | ICD-10-CM

## 2014-11-09 MED ORDER — NITROGLYCERIN 0.4 MG SL SUBL: 0.4000 mg | SUBLINGUAL_TABLET | SUBLINGUAL | Status: DC | PRN

## 2014-11-16 NOTE — Telephone Encounter (Signed)
Ok to refill 

## 2014-11-24 ENCOUNTER — Encounter: Payer: Self-pay | Admitting: Family Medicine

## 2014-11-24 ENCOUNTER — Ambulatory Visit (INDEPENDENT_AMBULATORY_CARE_PROVIDER_SITE_OTHER): Payer: Medicare Other | Admitting: Family Medicine

## 2014-11-24 VITALS — BP 122/72 | HR 86 | Wt 273.0 lb

## 2014-11-24 DIAGNOSIS — M216X2 Other acquired deformities of left foot: Secondary | ICD-10-CM | POA: Diagnosis not present

## 2014-11-24 DIAGNOSIS — M216X1 Other acquired deformities of right foot: Secondary | ICD-10-CM

## 2014-11-24 DIAGNOSIS — M1710 Unilateral primary osteoarthritis, unspecified knee: Secondary | ICD-10-CM | POA: Diagnosis not present

## 2014-11-24 NOTE — Patient Instructions (Addendum)
Good to see you Ice is still your friend up to 20 minutes 2 times daily Try the pennsaid twice daily when you need it Try the brace Vitamin D 2000 IU daily See me again in 3 weeks and consider the other injections if we need them.

## 2014-11-24 NOTE — Assessment & Plan Note (Signed)
Patient is given bilateral injections today and was given a medial unloader brace for his left knee. We discussed topical anti-inflammatories and over-the-counter medications that might be beneficial. Patient learn home exercises again to try to increase his range of motion and as well as foot and progression. Due to patient's over pronation of his feet we will also set patient up for custom orthotics that will help with alignment and slow down the progression. Patient and will see me again in 3-4 weeks for further evaluation. Patient continues to have pain he would be a candidate for viscous supplementation.

## 2014-11-24 NOTE — Progress Notes (Signed)
  I'm seeing this patient by the request  of:  [name], MD  CC: Knee pain follow up  HPI: Patient is a 69 year old gentleman coming in for followup of his osteoarthritis of the left knee.  Patient has known osteoporosis of the left knee that is severe in severity. Patient did have an injection in his knee back in December 2014. Patient states since then she's been doing fairly well until the last couple months. Patient states that he is having a marked dull aching pain of the knees bilaterally. Patient states that it seems to keep him up at night. Has tried over-the-counter medicines with minimal benefit. Patient rates the severity of pain a 7 out of 10.  Past medical, surgical, family and social history reviewed. Medications reviewed all in the electronic medical record.   Review of Systems: No headache, visual changes, nausea, vomiting, diarrhea, constipation, dizziness, abdominal pain, skin rash, fevers, chills, night sweats, weight loss, swollen lymph nodes, body aches, joint swelling, muscle aches, chest pain, shortness of breath, mood changes.   Objective:    Blood pressure 122/72, pulse 86, weight 273 lb (123.832 kg), SpO2 97 %.   General: No apparent distress alert and oriented x3 mood and affect normal, dressed appropriately.  HEENT: Pupils equal, extraocular movements intact Respiratory: Patient's speak in full sentences and does not appear short of breath Cardiovascular: No lower extremity edema, non tender, no erythema Skin: Warm dry intact with no signs of infection or rash on extremities or on axial skeleton. Abdomen: Soft nontender Neuro: Cranial nerves II through XII are intact, neurovascularly intact in all extremities with 2+ DTRs and 2+ pulses. Lymph: No lymphadenopathy of posterior or anterior cervical chain or axillae bilaterally.  Gait normal with good balance and coordination.  MSK: Non tender with full range of motion and good stability and symmetric strength and  tone of shoulders, elbows, wrist, hip, and ankles bilaterally.  Knee: bilateral Inspection shows the patient does have osteoarthritic changes of the knees bilaterally left greater than right Patient does have tenderness to palpation over the medial joint line. ROM full in flexion and extension and lower leg rotation. Ligaments with solid consistent endpoints including ACL, PCL, LCL, MCL. Positive Mcmurray's, Apley's, and Thessalonian tests. Painful patellar compression. Patellar glide with significant crepitus. Patellar and quadriceps tendons unremarkable. Hamstring and quadriceps strength is normal.   Foot exam shows patient does have overpronation of the hindfoot bilaterally with collection of the longitudinal arch. Mild bunion and bunionette formation bilaterally.   After informed written and verbal consent, patient was seated on exam table. Left knee was prepped with alcohol swab and utilizing anterolateral approach, patient's left knee space was injected with 4:1  marcaine 0.5%: Kenalog 40mg /dL. Patient tolerated the procedure well without immediate complications.  After informed written and verbal consent, patient was seated on exam table. Right knee was prepped with alcohol swab and utilizing anterolateral approach, patient's right knee space was injected with 4:1  marcaine 0.5%: Kenalog 40mg /dL. Patient tolerated the procedure well without immediate complications.  Impression and Recommendations:     This case required medical decision making of moderate complexity.

## 2014-11-24 NOTE — Progress Notes (Signed)
Pre visit review using our clinic review tool, if applicable. No additional management support is needed unless otherwise documented below in the visit note. 

## 2014-12-04 DIAGNOSIS — R1314 Dysphagia, pharyngoesophageal phase: Secondary | ICD-10-CM | POA: Diagnosis not present

## 2014-12-07 ENCOUNTER — Ambulatory Visit: Payer: Medicare Other | Admitting: Family Medicine

## 2014-12-21 ENCOUNTER — Encounter: Payer: Self-pay | Admitting: Family Medicine

## 2014-12-21 ENCOUNTER — Ambulatory Visit (INDEPENDENT_AMBULATORY_CARE_PROVIDER_SITE_OTHER): Payer: Medicare Other | Admitting: Family Medicine

## 2014-12-21 DIAGNOSIS — M216X2 Other acquired deformities of left foot: Secondary | ICD-10-CM | POA: Diagnosis not present

## 2014-12-21 DIAGNOSIS — M216X1 Other acquired deformities of right foot: Secondary | ICD-10-CM

## 2014-12-21 NOTE — Assessment & Plan Note (Signed)
Patient was fitted in custom orthotics today. We discussed increasing wear over the course of time. Please see patient instructions for greater detail. Patient follow-up in 2-4 weeks for further evaluation and treatment.

## 2014-12-21 NOTE — Progress Notes (Signed)
Patient was fitted for a : standard, cushioned, semi-rigid orthotic. The orthotic was heated and afterward the patient was in a seated position and the orthotic molded. The patient was positioned in subtalar neutral position and 10 degrees of ankle dorsiflexion in a non-weight bearing stance. After completion of molding, patient did have orthotic management which included instructions on acclimating to the orthotics, signs of ill fit as well as care for the orthotic.   The blank was ground to a stable position for weight bearing. Size: 14 (Igli Active)  Base: Carbon fiber Additional Posting and Padding: The following postings were fitted onto the molded orthotics to help maintain a talar neutral position - Wedge posting for transverse arch: 709/62       Silicone posting for longitudinal arch:  Bilaterally 300/120 + 250/100  The patient ambulated these, and they were very comfortable and supportive.

## 2014-12-21 NOTE — Patient Instructions (Signed)

## 2014-12-29 DIAGNOSIS — Z85828 Personal history of other malignant neoplasm of skin: Secondary | ICD-10-CM | POA: Diagnosis not present

## 2014-12-29 DIAGNOSIS — L570 Actinic keratosis: Secondary | ICD-10-CM | POA: Diagnosis not present

## 2014-12-29 DIAGNOSIS — L728 Other follicular cysts of the skin and subcutaneous tissue: Secondary | ICD-10-CM | POA: Diagnosis not present

## 2014-12-29 DIAGNOSIS — L821 Other seborrheic keratosis: Secondary | ICD-10-CM | POA: Diagnosis not present

## 2015-01-07 ENCOUNTER — Telehealth: Payer: Self-pay | Admitting: Internal Medicine

## 2015-01-07 ENCOUNTER — Encounter: Payer: Self-pay | Admitting: Internal Medicine

## 2015-01-07 ENCOUNTER — Ambulatory Visit (INDEPENDENT_AMBULATORY_CARE_PROVIDER_SITE_OTHER): Payer: Medicare Other | Admitting: Internal Medicine

## 2015-01-07 VITALS — BP 136/82 | HR 66 | Ht 77.0 in | Wt 268.4 lb

## 2015-01-07 DIAGNOSIS — E119 Type 2 diabetes mellitus without complications: Secondary | ICD-10-CM

## 2015-01-07 DIAGNOSIS — E785 Hyperlipidemia, unspecified: Secondary | ICD-10-CM | POA: Diagnosis not present

## 2015-01-07 DIAGNOSIS — I5022 Chronic systolic (congestive) heart failure: Secondary | ICD-10-CM | POA: Diagnosis not present

## 2015-01-07 DIAGNOSIS — I493 Ventricular premature depolarization: Secondary | ICD-10-CM

## 2015-01-07 DIAGNOSIS — Z0000 Encounter for general adult medical examination without abnormal findings: Secondary | ICD-10-CM

## 2015-01-07 NOTE — Telephone Encounter (Signed)
Called pt no answer LMOM md has place labs orders...Dennis Hill

## 2015-01-07 NOTE — Telephone Encounter (Signed)
Orders are done

## 2015-01-07 NOTE — Progress Notes (Signed)
Patient Care Team: Biagio Borg, MD as PCP - General   HPI  Dennis Hill is a 69 y.o. male Seen in follow-up for atrial flutter status post ablation and mild LV dysfunction with an ejection fraction of 40% or so. Myoview scan demonstrated ejection fraction of 41% (9/15) Because of the perfusion defect he underwent catheterization demonstrating nonobstructive disease. (10/15).  He has no complaints of chest pain shortness of breath palpitations weakness or lightheadedness.  We have stopped his beta blocker at his last visit because of cloudiness of thought; this improved significantly.  In the past he has been intolerant of statin therapy because of cloudiness of thought.    Past Medical History  Diagnosis Date  . DIABETES MELLITUS, TYPE II 01/28/2010    diet controlled  . DEPRESSION 03/15/2009  . GERD 03/15/2009  . DIVERTICULOSIS, COLON 03/15/2009  . 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 Hx of adenomatous colonic polyps 03/15/2009  . NEPHROLITHIASIS, HX OF 01/28/2010  . Anxiety   . Chronic insomnia   . Fatty liver   . Internal hemorrhoids   . Hyperlipidemia 02/10/2011  . Atrial bigeminy 2008    Dr. Caryl Comes  . Atrial fibrillation   . Atrial flutter   . Pneumonia ~ 1950  . Sleep apnea   . POEUMPNT(614.4)     "monthly" (03/06/2013)  . BPH (benign prostatic hypertrophy) 03/21/2013  . Hx of cardiovascular stress test     ETT-Myoview (9/15):  Inferior infarct, no ischemia, EF 41% - Intermediate Risk  . CAD (coronary artery disease)     LHC (10/15):  pLAD 20%, mCFX 20%, pRCA 20%, PDA 20%, EF 40%  . NICM (nonischemic cardiomyopathy)     Echo (10/15):  Mild LVH, EF 35-40%, diff HK    Past Surgical History  Procedure Laterality Date  . Appendectomy    . Cholecystectomy  2006  . Tonsillectomy and adenoidectomy  ~ 1952  . Inguinal hernia repair Right 1986  . Cardioversion N/A 02/06/2013    Procedure: CARDIOVERSION;  Surgeon: Evans Lance, MD;  Location: [place];  Service: Cardiovascular;  Laterality: N/A;  . Tee without cardioversion N/A 03/05/2013    Procedure: TRANSESOPHAGEAL ECHOCARDIOGRAM (TEE);  Surgeon: Larey Dresser, MD;  Location: [place];  Service: Cardiovascular;  Laterality: N/A;  Jeannene Patella Greggory Brandy  . Atrial flutter ablation  03/06/2013  . Atrial flutter ablation N/A 03/06/2013    Procedure: ATRIAL FLUTTER ABLATION;  Surgeon: Deboraha Sprang, MD;  Location: [facility] CATH LAB;  Service: Cardiovascular;  Laterality: N/A;  . Left heart catheterization with coronary angiogram N/A 06/19/2014    Procedure: LEFT HEART CATHETERIZATION WITH CORONARY ANGIOGRAM;  Surgeon: Burnell Blanks, MD;  Location: [facility] CATH LAB;  Service: Cardiovascular;  Laterality: N/A;    Current Outpatient Prescriptions  Medication Sig Dispense Refill  . acetaminophen (TYLENOL) 500 MG tablet Take 500 mg by mouth every 6 (six) hours as needed for pain.    Marland Kitchen albuterol (PROVENTIL HFA;VENTOLIN HFA) 108 (90 BASE) MCG/ACT inhaler Inhale 2 puffs into the lungs every 6 (six) hours as needed for wheezing or shortness of breath. 1 Inhaler 5  . Cyanocobalamin (VITAMIN B-12 PO) Take 1 tablet by mouth daily.    Marland Kitchen esomeprazole (NEXIUM) 40 MG capsule Take 40 mg by mouth daily as needed (HEARTBURN).    . fluticasone (FLONASE) 50 MCG/ACT nasal spray Place 2 sprays into both nostrils daily. (Patient taking differently: Place 2 sprays into both nostrils daily as needed for  allergies or rhinitis. ) 16 g 2  . nitroGLYCERIN (NITROSTAT) 0.4 MG SL tablet Place 1 tablet (0.4 mg total) under the tongue every 5 (five) minutes as needed for chest pain. 25 tablet 3  . Probiotic Product (PROBIOTIC PO) Take 1 capsule by mouth daily.    . Pyridoxine HCl (B-6 PO) Take 1 tablet by mouth daily.    . tamsulosin (FLOMAX) 0.4 MG CAPS capsule Take 0.4 mg by mouth every other day.     Marland Kitchen atorvastatin (LIPITOR) 10 MG tablet HOLD (Patient not taking: Reported on 01/07/2015)     No current facility-administered  medications for this visit.    Allergies  Allergen Reactions  . Lisinopril Other (See Comments)    Tongue swelling  . Xarelto [Rivaroxaban]     Tongue swelling    Review of Systems negative except from HPI and PMH  Physical Exam BP 136/82 mmHg  Pulse 66  Ht 6\' 5"  (1.956 m)  Wt 268 lb 6.4 oz (121.745 kg)  BMI 31.82 kg/m2 Well developed and well nourished in no acute distress HENT normal E scleral and icterus clear Neck Supple JVP flat; carotids brisk and full Clear to ausculation  *Regular rate and rhythm, no murmurs gallops or rub Soft with active bowel sounds No clubbing cyanosis  Edema Alert and oriented, grossly normal motor and sensory function Skin Warm and Dry  ECG was ordered today demonstrated sinus rhythm at 66 Interval 17/10/38 PVCs were identified with an inferior axis suggestive of a RVOT focus  Assessment and  Plan  Atrial flutter status post ablation  Nonischemic cardiomyopathy  Ace intolerance  PVCs question frequency  Dyslipidemia with low HDL an 10 year cardiovascular predicted risk   of 20%  The patient is doing quite well. There is no evidence of volume overload; he does have mild dyspnea on exertion  This resolved with ongoing exercise.  We will plan to reassess left ventricular function in 6 months. He has not been intolerant of statins beta blockers or Ace inhibitors; there are no data regarding the use of aldosterone antagonists for the prevention of LV dysfunction deterioration.  The other question that is begged by his ECG today is to whether the PVCs are contributing to his cardiomyopathy. We will assess PVC density by Holter monitoring. He is to have lipids checked in the next week; we have talked about ways to modify his HDL including exercise, knots, and red wine. He will embark on that therapeutic escapade and we will reassess his lipids again in 6 months.  We reviewed his lipids from 2 years ago; it were associated according to the  10 year ACC predictor with a 20% risk which would justify the initiation of statin therapy. He has not tolerated in the past because of cloudiness of thought. If his risk persists, I would be inclined to try him on a statin 2-3 days per week

## 2015-01-07 NOTE — Patient Instructions (Signed)
Medication Instructions:  Your physician recommends that you continue on your current medications as directed. Please refer to the Current Medication list given to you today.  Labwork: Your physician recommends that you return for lab work in: 6 months for lipid profile -- please fast for this labwork   Testing/Procedures: Your physician has recommended that you wear a 48 hour holter monitor. Holter monitors are medical devices that record the heart's electrical activity. Doctors most often use these monitors to diagnose arrhythmias. Arrhythmias are problems with the speed or rhythm of the heartbeat. The monitor is a small, portable device. You can wear one while you do your normal daily activities. This is usually used to diagnose what is causing palpitations/syncope (passing out).  Your physician has requested that you have an echocardiogram in 6 months. Echocardiography is a painless test that uses sound waves to create images of your heart. It provides your doctor with information about the size and shape of your heart and how well your heart's chambers and valves are working. This procedure takes approximately one hour. There are no restrictions for this procedure.   Follow-Up: Your physician wants you to follow-up in: 6 months with Dr. Caryl Comes. You will receive a reminder letter in the mail two months in advance. If you don't receive a letter, please call our office to schedule the follow-up appointment.   Any Other Special Instructions Will Be Listed Below (If Applicable).

## 2015-01-07 NOTE — Telephone Encounter (Signed)
orders are done

## 2015-01-07 NOTE — Telephone Encounter (Signed)
Pt has cpe on the 29th.  He would like the lab orders put in today?

## 2015-01-14 ENCOUNTER — Other Ambulatory Visit (INDEPENDENT_AMBULATORY_CARE_PROVIDER_SITE_OTHER): Payer: Medicare Other

## 2015-01-14 DIAGNOSIS — Z0189 Encounter for other specified special examinations: Secondary | ICD-10-CM

## 2015-01-14 DIAGNOSIS — E785 Hyperlipidemia, unspecified: Secondary | ICD-10-CM | POA: Diagnosis not present

## 2015-01-14 DIAGNOSIS — R310 Gross hematuria: Secondary | ICD-10-CM

## 2015-01-14 DIAGNOSIS — E119 Type 2 diabetes mellitus without complications: Secondary | ICD-10-CM | POA: Diagnosis not present

## 2015-01-14 DIAGNOSIS — Z0000 Encounter for general adult medical examination without abnormal findings: Secondary | ICD-10-CM

## 2015-01-14 LAB — LIPID PANEL
CHOL/HDL RATIO: 4
Cholesterol: 142 mg/dL (ref 0–200)
HDL: 39.8 mg/dL (ref >39.00)
LDL Cholesterol: 86 mg/dL (ref 0–99)
NONHDL: 102.2
Triglycerides: 80 mg/dL (ref 0.0–149.0)
VLDL: 16 mg/dL (ref 0.0–40.0)

## 2015-01-14 LAB — CBC WITH DIFFERENTIAL/PLATELET
BASOS ABS: 0.1 10*3/uL (ref 0.0–0.1)
Basophils Relative: 0.8 % (ref 0.0–3.0)
Eosinophils Absolute: 0.2 10*3/uL (ref 0.0–0.7)
Eosinophils Relative: 2.2 % (ref 0.0–5.0)
HCT: 44.7 % (ref 39.0–52.0)
Hemoglobin: 15 g/dL (ref 13.0–17.0)
Lymphocytes Relative: 24.9 % (ref 12.0–46.0)
Lymphs Abs: 2 10*3/uL (ref 0.7–4.0)
MCHC: 33.6 g/dL (ref 30.0–36.0)
MCV: 87.6 fl (ref 78.0–100.0)
Monocytes Absolute: 0.7 10*3/uL (ref 0.1–1.0)
Monocytes Relative: 8.4 % (ref 3.0–12.0)
NEUTROS ABS: 5.2 10*3/uL (ref 1.4–7.7)
Neutrophils Relative %: 63.7 % (ref 43.0–77.0)
Platelets: 231 10*3/uL (ref 150.0–400.0)
RBC: 5.1 Mil/uL (ref 4.22–5.81)
RDW: 14.3 % (ref 11.5–15.5)
WBC: 8.1 10*3/uL (ref 4.0–10.5)

## 2015-01-14 LAB — BASIC METABOLIC PANEL
BUN: 22 mg/dL (ref 6–23)
CALCIUM: 10.3 mg/dL (ref 8.4–10.5)
CHLORIDE: 106 meq/L (ref 96–112)
CO2: 27 meq/L (ref 19–32)
Creatinine, Ser: 1.22 mg/dL (ref 0.40–1.50)
GFR: 62.26 mL/min (ref >60.00)
GLUCOSE: 125 mg/dL — AB (ref 70–99)
Potassium: 4.3 mEq/L (ref 3.5–5.1)
Sodium: 140 mEq/L (ref 135–145)

## 2015-01-14 LAB — MICROALBUMIN / CREATININE URINE RATIO
CREATININE, U: 107.3 mg/dL
MICROALB UR: 0.7 mg/dL (ref 0.0–1.9)
Microalb Creat Ratio: 0.7 mg/g (ref 0.0–30.0)

## 2015-01-14 LAB — HEPATIC FUNCTION PANEL
ALBUMIN: 4.2 g/dL (ref 3.5–5.2)
ALT: 20 U/L (ref 0–53)
AST: 17 U/L (ref 0–37)
Alkaline Phosphatase: 66 U/L (ref 39–117)
BILIRUBIN DIRECT: 0.1 mg/dL (ref 0.0–0.3)
Total Bilirubin: 0.6 mg/dL (ref 0.2–1.2)
Total Protein: 6.5 g/dL (ref 6.0–8.3)

## 2015-01-14 LAB — URINALYSIS, ROUTINE W REFLEX MICROSCOPIC
Bilirubin Urine: NEGATIVE
Ketones, ur: NEGATIVE
LEUKOCYTES UA: NEGATIVE
NITRITE: NEGATIVE
PH: 5.5 (ref 5.0–8.0)
SPECIFIC GRAVITY, URINE: 1.02 (ref 1.000–1.030)
TOTAL PROTEIN, URINE-UPE24: NEGATIVE
URINE GLUCOSE: NEGATIVE
Urobilinogen, UA: 0.2 (ref 0.0–1.0)

## 2015-01-14 LAB — HEMOGLOBIN A1C: Hgb A1c MFr Bld: 6.5 % (ref 4.6–6.5)

## 2015-01-14 LAB — PSA: PSA: 3.49 ng/mL (ref 0.10–4.00)

## 2015-01-14 LAB — TSH: TSH: 2.19 u[IU]/mL (ref 0.35–4.50)

## 2015-01-15 ENCOUNTER — Ambulatory Visit (INDEPENDENT_AMBULATORY_CARE_PROVIDER_SITE_OTHER): Payer: Medicare Other | Admitting: Internal Medicine

## 2015-01-15 ENCOUNTER — Encounter: Payer: Self-pay | Admitting: Internal Medicine

## 2015-01-15 VITALS — BP 122/78 | HR 70 | Temp 98.6°F | Resp 18 | Ht 77.0 in | Wt 263.0 lb

## 2015-01-15 DIAGNOSIS — E119 Type 2 diabetes mellitus without complications: Secondary | ICD-10-CM | POA: Diagnosis not present

## 2015-01-15 DIAGNOSIS — Z0000 Encounter for general adult medical examination without abnormal findings: Secondary | ICD-10-CM | POA: Diagnosis not present

## 2015-01-15 DIAGNOSIS — R1011 Right upper quadrant pain: Secondary | ICD-10-CM | POA: Diagnosis not present

## 2015-01-15 DIAGNOSIS — R109 Unspecified abdominal pain: Secondary | ICD-10-CM | POA: Insufficient documentation

## 2015-01-15 DIAGNOSIS — E785 Hyperlipidemia, unspecified: Secondary | ICD-10-CM

## 2015-01-15 NOTE — Assessment & Plan Note (Signed)
Unclear etiology, labs not helpful, afeb, no specific GI or GU symtpoms - ? MSK, but cant r/o other - for abd u/s

## 2015-01-15 NOTE — Assessment & Plan Note (Signed)
stable overall by history and exam, recent data reviewed with pt, and pt to continue medical treatment as before,  to f/u any worsening symptoms or concerns  Lab Results  Component Value Date   LDLCALC 86 01/14/2015

## 2015-01-15 NOTE — Progress Notes (Signed)
Pre visit review using our clinic review tool, if applicable. No additional management support is needed unless otherwise documented below in the visit note. 

## 2015-01-15 NOTE — Patient Instructions (Signed)
Please continue all other medications as before, and refills have been done if requested.  Please have the pharmacy call with any other refills you may need.  Please continue your efforts at being more active, low cholesterol diet, and weight control.  You are otherwise up to date with prevention measures today.  Please keep your appointments with your specialists as you may have planned  You will be contacted regarding the referral for: abdomen ultrasound  Please return in 6 months, or sooner if needed, with Lab testing done 3-5 days before

## 2015-01-15 NOTE — Assessment & Plan Note (Signed)
stable overall by history and exam, recent data reviewed with pt, and pt to continue medical treatment as before,  to f/u any worsening symptoms or concerns Lab Results  Component Value Date   HGBA1C 6.5 01/14/2015

## 2015-01-15 NOTE — Progress Notes (Signed)
Subjective:    Patient ID: Dennis Hill, male    DOB: June 26, 1946, 69 y.o.   MRN: [id number]  HPI  Here for wellness and f/u;  Overall doing ok;  Pt denies Chest pain, worsening SOB, DOE, wheezing, orthopnea, PND, worsening LE edema, palpitations, dizziness or syncope.  Pt denies neurological change such as new headache, facial or extremity weakness.  Pt denies polydipsia, polyuria, or low sugar symptoms. Pt states overall good compliance with treatment and medications, good tolerability, and has been trying to follow appropriate diet.  Pt denies worsening depressive symptoms, suicidal ideation or panic. No fever, night sweats, wt loss, loss of appetite, or other constitutional symptoms.  Pt states good ability with ADL's, has low fall risk, home safety reviewed and adequate, no other significant changes in hearing or vision, and occasionally active with exercise. Has lost wt with better diet and motre active. Wt Readings from Last 3 Encounters:  01/15/15 263 lb 0.5 oz (119.31 kg)  01/07/15 268 lb 6.4 oz (121.745 kg)  11/24/14 273 lb (123.832 kg)  co hoarseness by end of days most days, and gets a pain to the laryngeal area with some left sided radiation in the neck with cough and yawn.  Gets off balance occasionaly with trying to turning while walking, also had a significant episode of dizziness /lightheaded with bending on hands and knees to find something then stand up. Also mentions right sded abd aching type discomfort at end of day, raidates to the right flank, worse to lie on right side only a few minutes and has to turn over.   Past Medical History  Diagnosis Date  . DIABETES MELLITUS, TYPE II 01/28/2010    diet controlled  . DEPRESSION 03/15/2009  . GERD 03/15/2009  . DIVERTICULOSIS, COLON 03/15/2009  . 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 Hx of adenomatous colonic polyps 03/15/2009  . NEPHROLITHIASIS, HX OF 01/28/2010  . Anxiety   . Chronic insomnia   . Fatty liver   .  Internal hemorrhoids   . Hyperlipidemia 02/10/2011  . Atrial bigeminy 2008    Dr. Caryl Comes  . Atrial fibrillation   . Atrial flutter   . Pneumonia ~ 1950  . Sleep apnea   . YFVCBSWH(675.9)     "monthly" (03/06/2013)  . BPH (benign prostatic hypertrophy) 03/21/2013  . Hx of cardiovascular stress test     ETT-Myoview (9/15):  Inferior infarct, no ischemia, EF 41% - Intermediate Risk  . CAD (coronary artery disease)     LHC (10/15):  pLAD 20%, mCFX 20%, pRCA 20%, PDA 20%, EF 40%  . NICM (nonischemic cardiomyopathy)     Echo (10/15):  Mild LVH, EF 35-40%, diff HK   Past Surgical History  Procedure Laterality Date  . Appendectomy    . Cholecystectomy  2006  . Tonsillectomy and adenoidectomy  ~ 1952  . Inguinal hernia repair Right 1986  . Cardioversion N/A 02/06/2013    Procedure: CARDIOVERSION;  Surgeon: Evans Lance, MD;  Location: [place];  Service: Cardiovascular;  Laterality: N/A;  . Tee without cardioversion N/A 03/05/2013    Procedure: TRANSESOPHAGEAL ECHOCARDIOGRAM (TEE);  Surgeon: Larey Dresser, MD;  Location: [place];  Service: Cardiovascular;  Laterality: N/A;  Jeannene Patella Greggory Brandy  . Atrial flutter ablation  03/06/2013  . Atrial flutter ablation N/A 03/06/2013    Procedure: ATRIAL FLUTTER ABLATION;  Surgeon: Deboraha Sprang, MD;  Location: [facility] CATH LAB;  Service: Cardiovascular;  Laterality: N/A;  . Left heart catheterization with coronary angiogram  N/A 06/19/2014    Procedure: LEFT HEART CATHETERIZATION WITH CORONARY ANGIOGRAM;  Surgeon: Burnell Blanks, MD;  Location: [facility] CATH LAB;  Service: Cardiovascular;  Laterality: N/A;    reports that he quit smoking about 35 years ago. His smoking use included Cigarettes. He has a 13 pack-year smoking history. He has never used smokeless tobacco. He reports that he drinks about 0.6 oz of alcohol per week. He reports that he does not use illicit drugs. family history includes Cancer in his maternal grandmother and mother; Colon cancer in his  mother; Lung cancer (age of onset: 19) in his mother. There is no history of CAD, Heart attack, or Stroke. Allergies  Allergen Reactions  . Lisinopril Other (See Comments)    Tongue swelling  . Xarelto [Rivaroxaban]     Tongue swelling   Current Outpatient Prescriptions on File Prior to Visit  Medication Sig Dispense Refill  . acetaminophen (TYLENOL) 500 MG tablet Take 500 mg by mouth every 6 (six) hours as needed for pain.    Marland Kitchen albuterol (PROVENTIL HFA;VENTOLIN HFA) 108 (90 BASE) MCG/ACT inhaler Inhale 2 puffs into the lungs every 6 (six) hours as needed for wheezing or shortness of breath. 1 Inhaler 5  . atorvastatin (LIPITOR) 10 MG tablet HOLD    . Cyanocobalamin (VITAMIN B-12 PO) Take 1 tablet by mouth daily.    Marland Kitchen esomeprazole (NEXIUM) 40 MG capsule Take 40 mg by mouth daily as needed (HEARTBURN).    . fluticasone (FLONASE) 50 MCG/ACT nasal spray Place 2 sprays into both nostrils daily. (Patient taking differently: Place 2 sprays into both nostrils daily as needed for allergies or rhinitis. ) 16 g 2  . nitroGLYCERIN (NITROSTAT) 0.4 MG SL tablet Place 1 tablet (0.4 mg total) under the tongue every 5 (five) minutes as needed for chest pain. 25 tablet 3  . Probiotic Product (PROBIOTIC PO) Take 1 capsule by mouth daily.    . Pyridoxine HCl (B-6 PO) Take 1 tablet by mouth daily.    . tamsulosin (FLOMAX) 0.4 MG CAPS capsule Take 0.4 mg by mouth every other day.      No current facility-administered medications on file prior to visit.    Review of Systems Constitutional: Negative for increased diaphoresis, other activity, appetite or siginficant weight change other than noted HENT: Negative for worsening hearing loss, ear pain, facial swelling, mouth sores and neck stiffness.   Eyes: Negative for other worsening pain, redness or visual disturbance.  Respiratory: Negative for shortness of breath and wheezing  Cardiovascular: Negative for chest pain and palpitations.  Gastrointestinal:  Negative for diarrhea, blood in stool, abdominal distention or other pain Genitourinary: Negative for hematuria, flank pain or change in urine volume.  Musculoskeletal: Negative for myalgias or other joint complaints.  Skin: Negative for color change and wound or drainage.  Neurological: Negative for syncope and numbness. other than noted Hematological: Negative for adenopathy. or other swelling Psychiatric/Behavioral: Negative for hallucinations, SI, self-injury, decreased concentration or other worsening agitation.      Objective:   Physical Exam BP 122/78 mmHg  Pulse 70  Temp(Src) 98.6 F (37 C) (Oral)  Resp 18  Ht 6\' 5"  (1.956 m)  Wt 263 lb 0.5 oz (119.31 kg)  BMI 31.18 kg/m2  SpO2 95% VS noted,  Constitutional: Pt is oriented to person, place, and time. Appears well-developed and well-nourished, in no significant distress Head: Normocephalic and atraumatic.  Right Ear: External ear normal.  Left Ear: External ear normal.  Nose: Nose normal.  Mouth/Throat:  Oropharynx is clear and moist.  Eyes: Conjunctivae and EOM are normal. Pupils are equal, round, and reactive to light.  Neck: Normal range of motion. Neck supple. No JVD present. No tracheal deviation present or significant neck LA or mass Cardiovascular: Normal rate, regular rhythm, normal heart sounds and intact distal pulses.   Pulmonary/Chest: Effort normal and breath sounds without rales or wheezing  Abdominal: Soft. Bowel sounds are normal. NT. No HSM  Musculoskeletal: Normal range of motion. Exhibits no edema.  Lymphadenopathy:  Has no cervical adenopathy.  Neurological: Pt is alert and oriented to person, place, and time. Pt has normal reflexes. No cranial nerve deficit. Motor grossly intact Skin: Skin is warm and dry. No rash noted.  Psychiatric:  Has normal mood and affect. Behavior is normal.     Assessment & Plan:

## 2015-01-15 NOTE — Assessment & Plan Note (Signed)

## 2015-01-18 ENCOUNTER — Other Ambulatory Visit: Payer: Self-pay | Admitting: *Deleted

## 2015-01-18 ENCOUNTER — Ambulatory Visit (INDEPENDENT_AMBULATORY_CARE_PROVIDER_SITE_OTHER): Payer: Medicare Other

## 2015-01-18 ENCOUNTER — Encounter: Payer: Self-pay | Admitting: *Deleted

## 2015-01-18 DIAGNOSIS — I493 Ventricular premature depolarization: Secondary | ICD-10-CM | POA: Diagnosis not present

## 2015-01-18 NOTE — Progress Notes (Signed)
Patient ID: Dennis Hill, male   DOB: Feb 16, 1946, 69 y.o.   MRN: [id number] [facility] 48 hour holter monitor applied to patient.

## 2015-01-21 ENCOUNTER — Ambulatory Visit
Admission: RE | Admit: 2015-01-21 | Discharge: 2015-01-21 | Disposition: A | Discharge status: Home / Self Care | Payer: Medicare Other | Source: Ambulatory Visit | Attending: Internal Medicine | Admitting: Internal Medicine

## 2015-01-21 DIAGNOSIS — K760 Fatty (change of) liver, not elsewhere classified: Secondary | ICD-10-CM | POA: Diagnosis not present

## 2015-01-21 DIAGNOSIS — N200 Calculus of kidney: Secondary | ICD-10-CM | POA: Diagnosis not present

## 2015-01-21 DIAGNOSIS — R1011 Right upper quadrant pain: Secondary | ICD-10-CM

## 2015-02-08 ENCOUNTER — Telehealth: Payer: Self-pay | Admitting: Internal Medicine

## 2015-02-08 MED ORDER — TAMSULOSIN HCL 0.4 MG PO CAPS: 0.4000 mg | ORAL_CAPSULE | ORAL | Status: DC

## 2015-02-08 NOTE — Telephone Encounter (Signed)
Notified pt rx sent to [facility] 

## 2015-02-08 NOTE — Telephone Encounter (Signed)
Patient is requesting tamsulosin to be sent to [facility] in [place].  Patient is out.  He would like to know if another physician would be able to approve to send this in.

## 2015-03-15 ENCOUNTER — Other Ambulatory Visit: Payer: Self-pay

## 2015-04-01 ENCOUNTER — Ambulatory Visit (INDEPENDENT_AMBULATORY_CARE_PROVIDER_SITE_OTHER): Payer: Medicare Other | Admitting: Family Medicine

## 2015-04-01 ENCOUNTER — Encounter: Payer: Self-pay | Admitting: Family Medicine

## 2015-04-01 VITALS — BP 126/82 | HR 79 | Ht 77.0 in | Wt 263.0 lb

## 2015-04-01 DIAGNOSIS — M1710 Unilateral primary osteoarthritis, unspecified knee: Secondary | ICD-10-CM | POA: Diagnosis not present

## 2015-04-01 NOTE — Progress Notes (Signed)
  I'm seeing this patient by the request  of:  [name], MD  CC: Knee pain follow up  HPI: Patient is a 69 year old gentleman coming in for followup of his osteoarthritis of the left knee.  Known osteoarthritis of the knees bilaterally. Patient did have sterile injection 3 months ago and did very well until the last 2 weeks. Patient is traveling out of town and will be doing a lot of walking and would like to have his knees feel significantly better. Patient denies any radiation down the leg or any numbness or tingling. Patient is been doing the home exercises but never get the over-the-counter natural supplementations. Patient though is able to do daily activities but states that he would like to have less pain while doing them. Denies any nighttime awakening.  Past medical, surgical, family and social history reviewed. Medications reviewed all in the electronic medical record.   Review of Systems: No headache, visual changes, nausea, vomiting, diarrhea, constipation, dizziness, abdominal pain, skin rash, fevers, chills, night sweats, weight loss, swollen lymph nodes, body aches, joint swelling, muscle aches, chest pain, shortness of breath, mood changes.   Objective:    Blood pressure 126/82, pulse 79, height 6\' 5"  (1.956 m), weight 263 lb (119.296 kg), SpO2 97 %.   General: No apparent distress alert and oriented x3 mood and affect normal, dressed appropriately.  HEENT: Pupils equal, extraocular movements intact Respiratory: Patient's speak in full sentences and does not appear short of breath Cardiovascular: No lower extremity edema, non tender, no erythema Skin: Warm dry intact with no signs of infection or rash on extremities or on axial skeleton. Abdomen: Soft nontender Neuro: Cranial nerves II through XII are intact, neurovascularly intact in all extremities with 2+ DTRs and 2+ pulses. Lymph: No lymphadenopathy of posterior or anterior cervical chain or axillae bilaterally.  Gait  normal with good balance and coordination.  MSK: Non tender with full range of motion and good stability and symmetric strength and tone of shoulders, elbows, wrist, hip, and ankles bilaterally.  Knee: bilateral Inspection shows the patient does have osteoarthritic changes of the knees bilaterally left greater than right Patient does have tenderness to palpation over the medial joint line. Minorly worse than previous exam ROM full in flexion and extension and lower leg rotation. Ligaments with solid consistent endpoints including ACL, PCL, LCL, MCL. Positive Mcmurray's, Apley's, and Thessalonian tests. Painful patellar compression. Patellar glide with significant crepitus. Patellar and quadriceps tendons unremarkable. Hamstring and quadriceps strength is normal.  No significant change from previous exam.    After informed written and verbal consent, patient was seated on exam table. Left knee was prepped with alcohol swab and utilizing anterolateral approach, patient's left knee space was injected with 4:1  marcaine 0.5%: Kenalog 40mg /dL. Patient tolerated the procedure well without immediate complications.  After informed written and verbal consent, patient was seated on exam table. Right knee was prepped with alcohol swab and utilizing anterolateral approach, patient's right knee space was injected with 4:1  marcaine 0.5%: Kenalog 40mg /dL. Patient tolerated the procedure well without immediate complications.  Impression and Recommendations:     This case required medical decision making of moderate complexity.

## 2015-04-01 NOTE — Assessment & Plan Note (Signed)
Patient was given injections today. We discussed icing regimen and home exercises. We discussed what activities to do and which ones to potentially avoid. Patient will try to make these different changes and come back and see me again in 1 month for further evaluation. Patient could be a candidate for viscous supplementation if necessary.

## 2015-04-01 NOTE — Progress Notes (Signed)
Pre visit review using our clinic review tool, if applicable. No additional management support is needed unless otherwise documented below in the visit note. 

## 2015-04-01 NOTE — Patient Instructions (Addendum)
Enjoy atl Continue to stay active OCnsider the other injections if needed See me when you need me.

## 2015-04-13 ENCOUNTER — Telehealth: Payer: Self-pay | Admitting: Internal Medicine

## 2015-04-13 NOTE — Telephone Encounter (Signed)
Pt called in and said that he needs refill on his tamsulosin (FLOMAX) 0.4 MG CAPS capsule [[id number]] .  He needs it to say take 1 daily. He said that they will not refill it because it says take one every other day.    Pharmacy - [facility]

## 2015-04-14 ENCOUNTER — Telehealth: Payer: Self-pay | Admitting: Internal Medicine

## 2015-04-14 DIAGNOSIS — L570 Actinic keratosis: Secondary | ICD-10-CM | POA: Diagnosis not present

## 2015-04-14 DIAGNOSIS — L821 Other seborrheic keratosis: Secondary | ICD-10-CM | POA: Diagnosis not present

## 2015-04-14 MED ORDER — TAMSULOSIN HCL 0.4 MG PO CAPS: 0.4000 mg | ORAL_CAPSULE | Freq: Every day | ORAL | Status: DC

## 2015-04-14 NOTE — Telephone Encounter (Signed)
Patient called again he is very upset.  Pt called in and said that he needs refill on his tamsulosin (FLOMAX) 0.4 MG CAPS capsule [[id number]] . He needs it to say take 1 daily. He said that they will not refill it because it says take one every other day.   Pharmacy - [facility]

## 2015-04-14 NOTE — Telephone Encounter (Signed)
rx corrected, not clear why the original stated qod as this would be very unusual  I will send again erx

## 2015-04-14 NOTE — Telephone Encounter (Signed)
Please advise on the rx sig. It reads one pill every other day, but the patient states that he takes one daily. Needs refill, but need clarification first. Thanks.

## 2015-06-14 DIAGNOSIS — L821 Other seborrheic keratosis: Secondary | ICD-10-CM | POA: Diagnosis not present

## 2015-06-14 DIAGNOSIS — D044 Carcinoma in situ of skin of scalp and neck: Secondary | ICD-10-CM | POA: Diagnosis not present

## 2015-06-14 DIAGNOSIS — Z85828 Personal history of other malignant neoplasm of skin: Secondary | ICD-10-CM | POA: Diagnosis not present

## 2015-07-05 ENCOUNTER — Ambulatory Visit (HOSPITAL_COMMUNITY): Payer: Medicare Other | Attending: Internal Medicine

## 2015-07-05 ENCOUNTER — Other Ambulatory Visit: Payer: Self-pay

## 2015-07-05 ENCOUNTER — Other Ambulatory Visit: Payer: Self-pay | Admitting: Internal Medicine

## 2015-07-05 ENCOUNTER — Other Ambulatory Visit: Payer: Medicare Other | Admitting: *Deleted

## 2015-07-05 DIAGNOSIS — E785 Hyperlipidemia, unspecified: Secondary | ICD-10-CM | POA: Diagnosis not present

## 2015-07-05 DIAGNOSIS — I5022 Chronic systolic (congestive) heart failure: Secondary | ICD-10-CM

## 2015-07-05 DIAGNOSIS — E119 Type 2 diabetes mellitus without complications: Secondary | ICD-10-CM | POA: Diagnosis not present

## 2015-07-05 DIAGNOSIS — I493 Ventricular premature depolarization: Secondary | ICD-10-CM

## 2015-07-05 DIAGNOSIS — I517 Cardiomegaly: Secondary | ICD-10-CM | POA: Diagnosis not present

## 2015-07-05 DIAGNOSIS — I071 Rheumatic tricuspid insufficiency: Secondary | ICD-10-CM | POA: Insufficient documentation

## 2015-07-05 LAB — LIPID PANEL
Cholesterol: 142 mg/dL (ref 125–200)
HDL: 34 mg/dL — AB (ref >=40)
LDL CALC: 88 mg/dL (ref <130)
TRIGLYCERIDES: 100 mg/dL (ref <150)
Total CHOL/HDL Ratio: 4.2 Ratio (ref <=5.0)
VLDL: 20 mg/dL (ref <30)

## 2015-07-05 NOTE — Addendum Note (Signed)
Addended by: Eulis Foster on: 07/05/2015 07:43 AM   Modules accepted: Orders

## 2015-07-13 ENCOUNTER — Ambulatory Visit (INDEPENDENT_AMBULATORY_CARE_PROVIDER_SITE_OTHER): Payer: Medicare Other | Admitting: Internal Medicine

## 2015-07-13 ENCOUNTER — Encounter: Payer: Self-pay | Admitting: Internal Medicine

## 2015-07-13 VITALS — BP 120/68 | HR 78 | Ht 77.0 in | Wt 266.0 lb

## 2015-07-13 DIAGNOSIS — I493 Ventricular premature depolarization: Secondary | ICD-10-CM

## 2015-07-13 NOTE — Progress Notes (Signed)
Patient Care Team: Biagio Borg, MD as PCP - General   HPI  Dennis Hill is a 69 y.o. male Seen in follow-up for atrial flutter status post ablation and mild LV dysfunction with an ejection fraction of 40% or so. Myoview scan demonstrated ejection fraction of 41% (9/15) Because of the perfusion defect he underwent catheterization demonstrating nonobstructive disease. (10/15).  He has no complaints of chest pain shortness of breath palpitations weakness or lightheadedness.  He has significant PVCs. Holter monitor was ordered. This was never consummated;  In the past he has been intolerant of statin therapy and beta blockers in the past because of cloudiness of thought.  His wife is intercurrently been diagnosed with stage IV lung cancer. 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 DIABETES MELLITUS, TYPE II 01/28/2010    diet controlled  . DEPRESSION 03/15/2009  . GERD 03/15/2009  . DIVERTICULOSIS, COLON 03/15/2009  . 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 Hx of adenomatous colonic polyps 03/15/2009  . NEPHROLITHIASIS, HX OF 01/28/2010  . Anxiety   . Chronic insomnia   . Fatty liver   . Internal hemorrhoids   . Hyperlipidemia 02/10/2011  . Atrial bigeminy 2008    Dr. Caryl Comes  . Atrial fibrillation ([place])   . Atrial flutter ([place])   . Pneumonia ~ 1950  . Sleep apnea   . OACZYSAY(301.6)     "monthly" (03/06/2013)  . BPH (benign prostatic hypertrophy) 03/21/2013  . Hx of cardiovascular stress test     ETT-Myoview (9/15):  Inferior infarct, no ischemia, EF 41% - Intermediate Risk  . CAD (coronary artery disease)     LHC (10/15):  pLAD 20%, mCFX 20%, pRCA 20%, PDA 20%, EF 40%  . NICM (nonischemic cardiomyopathy) ([place])     Echo (10/15):  Mild LVH, EF 35-40%, diff HK    Past Surgical History  Procedure Laterality Date  . Appendectomy    .  Cholecystectomy  2006  . Tonsillectomy and adenoidectomy  ~ 1952  . Inguinal hernia repair Right 1986  . Cardioversion N/A 02/06/2013    Procedure: CARDIOVERSION;  Surgeon: Evans Lance, MD;  Location: [facility];  Service: Cardiovascular;  Laterality: N/A;  . Tee without cardioversion N/A 03/05/2013    Procedure: TRANSESOPHAGEAL ECHOCARDIOGRAM (TEE);  Surgeon: Larey Dresser, MD;  Location: [place];  Service: Cardiovascular;  Laterality: N/A;  Jeannene Patella Greggory Brandy  . Atrial flutter ablation  03/06/2013  . Atrial flutter ablation N/A 03/06/2013    Procedure: ATRIAL FLUTTER ABLATION;  Surgeon: Deboraha Sprang, MD;  Location: [facility] C[facility] LAB;  Service: Cardiovascular;  Laterality: N/A;  . Left heart catheterization with coronary angiogram N/A 06/19/2014    Procedure: LEFT HEART CATHETERIZATION WITH CORONARY ANGIOGRAM;  Surgeon: Burnell Blanks, MD;  Location: [facility] CATH LAB;  Service: Cardiovascular;  Laterality: N/A;    Current Outpatient Prescriptions  Medication Sig Dispense Refill  . acetaminophen (TYLENOL) 500 MG tablet Take 500 mg by mouth every 6 (six) hours as needed for pain.    Marland Kitchen albuterol (PROVENTIL HFA;VENTOLIN HFA) 108 (90 BASE) MCG/ACT inhaler Inhale 2 puffs into the lungs every 6 (six) hours as needed for wheezing or shortness of breath. 1 Inhaler 5  . Cyanocobalamin (VITAMIN B-12 PO) Take 1 tablet by mouth  daily.    . esomeprazole (NEXIUM) 40 MG capsule Take 40 mg by mouth daily as needed (HEARTBURN).    . fluticasone (FLONASE) 50 MCG/ACT nasal spray Place 2 sprays into both nostrils daily. (Patient taking differently: Place 2 sprays into both nostrils daily as needed for allergies or rhinitis. ) 16 g 2  . nitroGLYCERIN (NITROSTAT) 0.4 MG SL tablet Place 1 tablet (0.4 mg total) under the tongue every 5 (five) minutes as needed for chest pain. 25 tablet 3  . Probiotic Product (PROBIOTIC PO) Take 1 capsule by mouth daily.    . Pyridoxine HCl (B-6 PO) Take 1 tablet by mouth daily.    .  tamsulosin (FLOMAX) 0.4 MG CAPS capsule Take 1 capsule (0.4 mg total) by mouth daily. 90 capsule 3  . atorvastatin (LIPITOR) 10 MG tablet HOLD (Patient not taking: Reported on 07/13/2015)     No current facility-administered medications for this visit.    Allergies  Allergen Reactions  . Lipitor [Atorvastatin] Other (See Comments)    Mental foggy  . Lisinopril Other (See Comments)    Tongue swelling  . Xarelto [Rivaroxaban]     Tongue swelling    Review of Systems negative except from HPI and PMH  Physical Exam BP 120/68 mmHg  Pulse 78  Ht 6\' 5"  (1.956 m)  Wt 266 lb (120.657 kg)  BMI 31.54 kg/m2 Well developed and well nourished in no acute distress HENT normal E scleral and icterus clear Neck Supple JVP flat; carotids brisk and full Clear to ausculation  *Regular rate and rhythm, no murmurs gallops or rub Soft with active bowel sounds No clubbing cyanosis  Edema Alert and oriented, grossly normal motor and sensory function Skin Warm and Dry  ECG was ordered today demonstrated sinus rhythm at 78 Interval 17/10/38 No PVCs  Assessment and  Plan  Atrial flutter status post ablation  Nonischemic cardiomyopathy  Ace beta blocker and statin intolerance  PVCs question frequency  Dyslipidemia with low HDL an 10 year cardiovascular predicted risk   of 20%  The patient is doing quite well. There is no evidence of volume overload;   We discussed the impact of his wife's cancer on stress and sleep and strategies that might be helpful.

## 2015-07-13 NOTE — Patient Instructions (Signed)
Medication Instructions: - no changes  Labwork: - none  Procedures/Testing: - none  Follow-Up: - Your physician wants you to follow-up in: 1 year with Dr. Klein. You will receive a reminder letter in the mail two months in advance. If you don't receive a letter, please call our office to schedule the follow-up appointment.  Any Additional Special Instructions Will Be Listed Below (If Applicable). - none  

## 2015-07-15 ENCOUNTER — Encounter: Payer: Self-pay | Admitting: Internal Medicine

## 2015-07-15 ENCOUNTER — Ambulatory Visit (INDEPENDENT_AMBULATORY_CARE_PROVIDER_SITE_OTHER): Payer: Medicare Other | Admitting: Internal Medicine

## 2015-07-15 VITALS — BP 118/76 | HR 76 | Temp 97.9°F | Ht 77.0 in | Wt 266.0 lb

## 2015-07-15 DIAGNOSIS — J019 Acute sinusitis, unspecified: Secondary | ICD-10-CM | POA: Insufficient documentation

## 2015-07-15 DIAGNOSIS — E119 Type 2 diabetes mellitus without complications: Secondary | ICD-10-CM

## 2015-07-15 LAB — POCT INFLUENZA A/B: INFLUENZA A, POC: NEGATIVE

## 2015-07-15 MED ORDER — HYDROCODONE-HOMATROPINE 5-1.5 MG/5ML PO SYRP: 5.0000 mL | ORAL_SOLUTION | Freq: Four times a day (QID) | ORAL | Status: DC | PRN

## 2015-07-15 MED ORDER — LEVOFLOXACIN 250 MG PO TABS: 250.0000 mg | ORAL_TABLET | Freq: Every day | ORAL | Status: DC

## 2015-07-15 NOTE — Progress Notes (Signed)
Subjective:    Patient ID: Dennis Hill, male    DOB: 18-May-1946, 69 y.o.   MRN: [id number]  HPI   Here with 2-3 days acute onset fever, facial pain, pressure, headache, general weakness and malaise, and greenish d/c, with mild ST and cough, but pt denies chest pain, wheezing, increased sob or doe, orthopnea, PND, increased LE swelling, palpitations, dizziness or syncope.  No current complaints Past Medical History  Diagnosis Date  . DIABETES MELLITUS, TYPE II 01/28/2010    diet controlled  . DEPRESSION 03/15/2009  . GERD 03/15/2009  . DIVERTICULOSIS, COLON 03/15/2009  . 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 Hx of adenomatous colonic polyps 03/15/2009  . NEPHROLITHIASIS, HX OF 01/28/2010  . Anxiety   . Chronic insomnia   . Fatty liver   . Internal hemorrhoids   . Hyperlipidemia 02/10/2011  . Atrial bigeminy 2008    Dr. Caryl Comes  . Atrial fibrillation ([facility]) flutter   . Pneumonia ~ 1950  . Sleep apnea   . EHOZYYQM(250.0)     "monthly" (03/06/2013)  . BPH (benign prostatic hypertrophy) 03/21/2013  . Hx of cardiovascular stress test     ETT-Myoview (9/15):  Inferior infarct, no ischemia, EF 41% - Intermediate Risk  . CAD (coronary artery disease)     LHC (10/15):  pLAD 20%, mCFX 20%, pRCA 20%, PDA 20%, EF 40%  . NICM (nonischemic cardiomyopathy) ([place])     Echo (10/15):  Mild LVH, EF 35-40%, diff HK   Past Surgical History  Procedure Laterality Date  . Appendectomy    . Cholecystectomy  2006  . Tonsillectomy and adenoidectomy  ~ 1952  . Inguinal hernia repair Right 1986  . Cardioversion N/A 02/06/2013    Procedure: CARDIOVERSION;  Surgeon: Evans Lance, MD;  Location: [place];  Service: Cardiovascular;  Laterality: N/A;  . Tee without cardioversion N/A 03/05/2013    Procedure: TRANSESOPHAGEAL ECHOCARDIOGRAM (TEE);  Surgeon: Larey Dresser, MD;  Location: [place];  Service: Cardiovascular;  Laterality: N/A;  Jeannene Patella Greggory Brandy  . Atrial flutter ablation  03/06/2013  . Atrial  flutter ablation N/A 03/06/2013    Procedure: ATRIAL FLUTTER ABLATION;  Surgeon: Deboraha Sprang, MD;  Location: [facility] CATH LAB;  Service: Cardiovascular;  Laterality: N/A;  . Left heart catheterization with coronary angiogram N/A 06/19/2014    Procedure: LEFT HEART CATHETERIZATION WITH CORONARY ANGIOGRAM;  Surgeon: Burnell Blanks, MD;  Location: [facility] CATH LAB;  Service: Cardiovascular;  Laterality: N/A;    reports that he quit smoking about 35 years ago. His smoking use included Cigarettes. He has a 13 pack-year smoking history. He has never used smokeless tobacco. He reports that he drinks about 0.6 oz of alcohol per week. He reports that he does not use illicit drugs. family history includes Cancer in his maternal grandmother and mother; Colon cancer in his mother; Lung cancer (age of onset: 10) in his mother. There is no history of CAD, Heart attack, or Stroke. Allergies  Allergen Reactions  . Lipitor [Atorvastatin] Other (See Comments)    Mental foggy  . Lisinopril Other (See Comments)    Tongue swelling  . Xarelto [Rivaroxaban]     Tongue swelling   Current Outpatient Prescriptions on File Prior to Visit  Medication Sig Dispense Refill  . acetaminophen (TYLENOL) 500 MG tablet Take 500 mg by mouth every 6 (six) hours as needed for pain.    Marland Kitchen albuterol (PROVENTIL HFA;VENTOLIN HFA) 108 (90 BASE) MCG/ACT inhaler Inhale 2 puffs into the lungs every  6 (six) hours as needed for wheezing or shortness of breath. 1 Inhaler 5  . Cyanocobalamin (VITAMIN B-12 PO) Take 1 tablet by mouth daily.    Marland Kitchen esomeprazole (NEXIUM) 40 MG capsule Take 40 mg by mouth daily as needed (HEARTBURN).    . fluticasone (FLONASE) 50 MCG/ACT nasal spray Place 2 sprays into both nostrils daily. (Patient taking differently: Place 2 sprays into both nostrils daily as needed for allergies or rhinitis. ) 16 g 2  . nitroGLYCERIN (NITROSTAT) 0.4 MG SL tablet Place 1 tablet (0.4 mg total) under the tongue every 5 (five) minutes as  needed for chest pain. 25 tablet 3  . Probiotic Product (PROBIOTIC PO) Take 1 capsule by mouth daily.    . Pyridoxine HCl (B-6 PO) Take 1 tablet by mouth daily.    . tamsulosin (FLOMAX) 0.4 MG CAPS capsule Take 1 capsule (0.4 mg total) by mouth daily. 90 capsule 3  . atorvastatin (LIPITOR) 10 MG tablet HOLD (Patient not taking: Reported on 07/13/2015)     No current facility-administered medications on file prior to visit.   Review of Systems  All otherwise neg per pt     Objective:   Physical Exam BP 118/76 mmHg  Pulse 76  Temp(Src) 97.9 F (36.6 C) (Oral)  Ht 6\' 5"  (1.956 m)  Wt 266 lb (120.657 kg)  BMI 31.54 kg/m2  SpO2 96% VS noted,  Constitutional: Pt appears in no significant distress HENT: Head: NCAT.  Right Ear: External ear normal.  Left Ear: External ear normal.  Eyes: . Pupils are equal, round, and reactive to light. Conjunctivae and EOM are normal Bilat tm's with mild erythema.  Max sinus areas mild tender.  Pharynx with mild erythema, no exudate Neck: Normal range of motion. Neck supple.  Cardiovascular: Normal rate and regular rhythm.   Pulmonary/Chest: Effort normal and breath sounds without rales or wheezing.  Neurological: Pt is alert. Not confused , motor grossly intact Skin: Skin is warm. No rash, no LE edema Psychiatric: Pt behavior is normal. No agitation.     Assessment & Plan:

## 2015-07-15 NOTE — Progress Notes (Signed)
Pre visit review using our clinic review tool, if applicable. No additional management support is needed unless otherwise documented below in the visit note. 

## 2015-07-15 NOTE — Addendum Note (Signed)
Addended by: Lyman Bishop on: 07/15/2015 03:35 PM   Modules accepted: Orders

## 2015-07-15 NOTE — Patient Instructions (Signed)
Your rapid flu test was negative  Please take all new medication as prescribed - the antibiotics, and cough medicine if needed  You can also take Mucinex (or it's generic off brand) for congestion, and tylenol as needed for pain.  Please continue all other medications as before, and refills have been done if requested.  Please have the pharmacy call with any other refills you may need.  Please keep your appointments with your specialists as you may have planned

## 2015-07-15 NOTE — Assessment & Plan Note (Signed)
stable overall by history and exam, recent data reviewed with pt, and pt to continue medical treatment as before,  to f/u any worsening symptoms or concerns Lab Results  Component Value Date   HGBA1C 6.5 01/14/2015    

## 2015-07-28 ENCOUNTER — Ambulatory Visit (INDEPENDENT_AMBULATORY_CARE_PROVIDER_SITE_OTHER): Payer: Medicare Other | Admitting: Family Medicine

## 2015-07-28 ENCOUNTER — Ambulatory Visit
Admission: RE | Admit: 2015-07-28 | Discharge: 2015-07-28 | Disposition: A | Discharge status: Home / Self Care | Payer: Medicare Other | Source: Ambulatory Visit | Attending: Family Medicine | Admitting: Family Medicine

## 2015-07-28 ENCOUNTER — Encounter: Payer: Self-pay | Admitting: Family Medicine

## 2015-07-28 ENCOUNTER — Other Ambulatory Visit (INDEPENDENT_AMBULATORY_CARE_PROVIDER_SITE_OTHER): Payer: Medicare Other

## 2015-07-28 VITALS — BP 118/80 | HR 95 | Ht 77.0 in | Wt 266.0 lb

## 2015-07-28 DIAGNOSIS — M1712 Unilateral primary osteoarthritis, left knee: Secondary | ICD-10-CM | POA: Diagnosis not present

## 2015-07-28 DIAGNOSIS — M25569 Pain in unspecified knee: Secondary | ICD-10-CM

## 2015-07-28 DIAGNOSIS — M1710 Unilateral primary osteoarthritis, unspecified knee: Secondary | ICD-10-CM

## 2015-07-28 NOTE — Patient Instructions (Signed)
Good to see you Started Dennis Hill is your friend especially in 6 hours Start tylenol 325-650mg  3 times daily Vitamin D 2000 IU daily Turmeric 500mg  twice daily See you next week.

## 2015-07-28 NOTE — Progress Notes (Signed)
  I'm seeing this patient by the request  of:  [name], MD  CC: Knee pain follow up  HPI: Patient is a 69 year old gentleman coming in for followup of his osteoarthritis of the left knee.  Known osteoarthritis of the knees bilaterally. Patient did have a cortisone injection in the knees bilaterally 3 weeks ago. States that the knees are starting to be painful again. Patient states that they're affect his daily activities. Feels like he has some swelling on the left knee. Seems to be tighter. Denies any radiation or any significant instability that is worse than previously.  Past medical, surgical, family and social history reviewed. Medications reviewed all in the electronic medical record.   Review of Systems: No headache, visual changes, nausea, vomiting, diarrhea, constipation, dizziness, abdominal pain, skin rash, fevers, chills, night sweats, weight loss, swollen lymph nodes, body aches, joint swelling, muscle aches, chest pain, shortness of breath, mood changes.   Objective:    Blood pressure 118/80, pulse 95, height 6\' 5"  (1.956 m), weight 266 lb (120.657 kg), SpO2 95 %.   General: No apparent distress alert and oriented x3 mood and affect normal, dressed appropriately.  HEENT: Pupils equal, extraocular movements intact Respiratory: Patient's speak in full sentences and does not appear short of breath Cardiovascular: No lower extremity edema, non tender, no erythema Skin: Warm dry intact with no signs of infection or rash on extremities or on axial skeleton. Abdomen: Soft nontender Neuro: Cranial nerves II through XII are intact, neurovascularly intact in all extremities with 2+ DTRs and 2+ pulses. Lymph: No lymphadenopathy of posterior or anterior cervical chain or axillae bilaterally.  Gait normal with good balance and coordination.  MSK: Non tender with full range of motion and good stability and symmetric strength and tone of shoulders, elbows, wrist, hip, and ankles bilaterally.   Knee: bilateral Inspection shows the patient does have osteoarthritic changes of the knees bilaterally left greater than right patient also has an effusion of the left knee. Patient does have tenderness to palpation over the medial joint line. Minorly worse than previous exam ROM full in flexion and extension and lower leg rotation. Ligaments with solid consistent endpoints including ACL, PCL, LCL, MCL. Positive Mcmurray's, Apley's, and Thessalonian tests. Painful patellar compression. Patellar glide with significant crepitus. Patellar and quadriceps tendons unremarkable. Hamstring and quadriceps strength is normal.  No significant change from previous exam.    After informed written and verbal consent, patient was seated on exam table. Left knee was prepped with alcohol swab and utilizing anterolateral approach, patient's left knee space was injected with 15 mg/2.5 mL of Orthovisc (sodium hyaluronate) in a prefilled syringe was injected easily into the knee through a 22-gauge needle. Patient tolerated the procedure well without immediate complications.  After informed written and verbal consent, patient was seated on exam table. Right knee was prepped with alcohol swab and utilizing anterolateral approach, patient's right knee space was injected with 15 mg/2.5 mL of Orthovisc (sodium hyaluronate) in a prefilled syringe was injected easily into the knee through a 22-gauge needle. Patient tolerated the procedure well without immediate complications.  Impression and Recommendations:     This case required medical decision making of moderate complexity.

## 2015-07-28 NOTE — Assessment & Plan Note (Signed)
Patient started the viscous supplementation they. Encourage him to continue the home exercises in the icing pedicle. Next time we will aspirate patient's left knee before the injection. Patient did have trace amount of effusion.  We discussed continuing conservative therapy. We discussed the possibility of a custom brace which patient declined. We'll see patient back again in 1 week for second in a series of 4 injections quite laterally.

## 2015-07-28 NOTE — Progress Notes (Signed)
Pre visit review using our clinic review tool, if applicable. No additional management support is needed unless otherwise documented below in the visit note. 

## 2015-08-05 ENCOUNTER — Ambulatory Visit: Payer: Medicare Other | Admitting: Family Medicine

## 2015-08-09 ENCOUNTER — Telehealth: Payer: Self-pay | Admitting: Family Medicine

## 2015-08-09 NOTE — Telephone Encounter (Signed)
Patient would like to move appointment from tomorrow to today because he is not walking good.  He would like to try to be worked in today or kept in mind if there is a cancellations.

## 2015-08-10 ENCOUNTER — Encounter: Payer: Self-pay | Admitting: Family Medicine

## 2015-08-10 ENCOUNTER — Ambulatory Visit (INDEPENDENT_AMBULATORY_CARE_PROVIDER_SITE_OTHER): Payer: Medicare Other | Admitting: Family Medicine

## 2015-08-10 VITALS — BP 120/82 | HR 74 | Ht 77.0 in | Wt 266.0 lb

## 2015-08-10 DIAGNOSIS — M1712 Unilateral primary osteoarthritis, left knee: Secondary | ICD-10-CM

## 2015-08-10 DIAGNOSIS — M1710 Unilateral primary osteoarthritis, unspecified knee: Secondary | ICD-10-CM | POA: Diagnosis not present

## 2015-08-10 NOTE — Patient Instructions (Signed)
Good to see you Ice is your friend Stay active  we will see you again for 3rd injection in each knee.

## 2015-08-10 NOTE — Progress Notes (Signed)
Pre visit review using our clinic review tool, if applicable. No additional management support is needed unless otherwise documented below in the visit note. 

## 2015-08-10 NOTE — Progress Notes (Signed)
  I'm seeing this patient by the request  of:  [name], MD  CC: Knee pain follow up  HPI: Patient is a 69 year old gentleman coming in for followup of his osteoarthritis of the left knee.  Known osteoarthritis of the knees bilaterally. Patient is here for a second in a series of 4 injections for the viscous supplementation. Patient states after the first one  moderate improvement.  Past medical, surgical, family and social history reviewed. Medications reviewed all in the electronic medical record.   Review of Systems: No headache, visual changes, nausea, vomiting, diarrhea, constipation, dizziness, abdominal pain, skin rash, fevers, chills, night sweats, weight loss, swollen lymph nodes, body aches, joint swelling, muscle aches, chest pain, shortness of breath, mood changes.   Objective:    Blood pressure 120/82, pulse 74, height 6\' 5"  (1.956 m), weight 266 lb (120.657 kg), SpO2 96 %.   General: No apparent distress alert and oriented x3 mood and affect normal, dressed appropriately.  HEENT: Pupils equal, extraocular movements intact Respiratory: Patient's speak in full sentences and does not appear short of breath Cardiovascular: No lower extremity edema, non tender, no erythema Skin: Warm dry intact with no signs of infection or rash on extremities or on axial skeleton. Abdomen: Soft nontender Neuro: Cranial nerves II through XII are intact, neurovascularly intact in all extremities with 2+ DTRs and 2+ pulses. Lymph: No lymphadenopathy of posterior or anterior cervical chain or axillae bilaterally.  Gait normal with good balance and coordination.  MSK: Non tender with full range of motion and good stability and symmetric strength and tone of shoulders, elbows, wrist, hip, and ankles bilaterally.  Knee: bilateral Inspection shows the patient does have osteoarthritic changes of the knees bilaterally left greater than right patient also has an effusion of the left knee. Patient does have  tenderness to palpation over the medial joint line. Minorly worse than previous exam ROM full in flexion and extension and lower leg rotation. Ligaments with solid consistent endpoints including ACL, PCL, LCL, MCL. Positive Mcmurray's, Apley's, and Thessalonian tests. Painful patellar compression. Patellar glide with significant crepitus. Patellar and quadriceps tendons unremarkable. Hamstring and quadriceps strength is normal.  No significant change from previous exam.    After informed written and verbal consent, patient was seated on exam table. Left knee was prepped with alcohol swab and utilizing anterolateral approach, patient's left knee space was injected with 15 mg/2.5 mL of Orthovisc (sodium hyaluronate) in a prefilled syringe was injected easily into the knee through a 22-gauge needle. Patient tolerated the procedure well without immediate complications.  After informed written and verbal consent, patient was seated on exam table. Right knee was prepped with alcohol swab and utilizing anterolateral approach, patient's right knee space was injected with 15 mg/2.5 mL of Orthovisc (sodium hyaluronate) in a prefilled syringe was injected easily into the knee through a 22-gauge needle. Patient tolerated the procedure well without immediate complications.  Impression and Recommendations:     This case required medical decision making of moderate complexity.

## 2015-08-10 NOTE — Assessment & Plan Note (Signed)
Second in a series of 4 injections given today. Continue conservative therapy. Will come back and see [place] again in 1 week for third and no series of 4 injections.

## 2015-08-10 NOTE — Telephone Encounter (Signed)
Pt is being seen by dr Tamala Julian today.

## 2015-08-16 ENCOUNTER — Encounter: Payer: Self-pay | Admitting: Family Medicine

## 2015-08-16 ENCOUNTER — Ambulatory Visit (INDEPENDENT_AMBULATORY_CARE_PROVIDER_SITE_OTHER): Payer: Medicare Other | Admitting: Family Medicine

## 2015-08-16 VITALS — BP 130/84 | HR 83 | Ht 77.0 in | Wt 266.0 lb

## 2015-08-16 DIAGNOSIS — M170 Bilateral primary osteoarthritis of knee: Secondary | ICD-10-CM | POA: Diagnosis not present

## 2015-08-16 DIAGNOSIS — M1710 Unilateral primary osteoarthritis, unspecified knee: Secondary | ICD-10-CM | POA: Diagnosis not present

## 2015-08-16 NOTE — Progress Notes (Signed)
  I'm seeing this patient by the request  of:  [name], MD  CC: Knee pain follow up  HPI: Patient is a 69 year old gentleman coming in for followup of his osteoarthritis of the left knee.  Known osteoarthritis of the knees bilaterally. Patient is here for a 3rd in a series of 4 injections for the viscous supplementation. Patient states that his right knee is feeling significantly better. Continuing to have some pain in the left knee with swelling still.  Past medical, surgical, family and social history reviewed. Medications reviewed all in the electronic medical record.   Review of Systems: No headache, visual changes, nausea, vomiting, diarrhea, constipation, dizziness, abdominal pain, skin rash, fevers, chills, night sweats, weight loss, swollen lymph nodes, body aches, joint swelling, muscle aches, chest pain, shortness of breath, mood changes.   Objective:    Blood pressure 130/84, pulse 83, height 6\' 5"  (1.956 m), weight 266 lb (120.657 kg), SpO2 96 %.   General: No apparent distress alert and oriented x3 mood and affect normal, dressed appropriately.  HEENT: Pupils equal, extraocular movements intact Respiratory: Patient's speak in full sentences and does not appear short of breath Cardiovascular: No lower extremity edema, non tender, no erythema Skin: Warm dry intact with no signs of infection or rash on extremities or on axial skeleton. Abdomen: Soft nontender Neuro: Cranial nerves II through XII are intact, neurovascularly intact in all extremities with 2+ DTRs and 2+ pulses. Lymph: No lymphadenopathy of posterior or anterior cervical chain or axillae bilaterally.  Gait normal with good balance and coordination.  MSK: Non tender with full range of motion and good stability and symmetric strength and tone of shoulders, elbows, wrist, hip, and ankles bilaterally.  Knee: bilateral Inspection shows the patient does have osteoarthritic changes of the knees bilaterally left greater  than right patient also has an effusion of the left knee still present with no significant change Patient does have tenderness to palpation over the medial joint line. Minorly worse than previous exam ROM full in flexion and extension and lower leg rotation. Ligaments with solid consistent endpoints including ACL, PCL, LCL, MCL. Positive Mcmurray's, Apley's, and Thessalonian tests. Painful patellar compression. Patellar glide with significant crepitus. Patellar and quadriceps tendons unremarkable. Hamstring and quadriceps strength is normal.  No significant change from previous exam.    After informed written and verbal consent, patient was seated on exam table. Left knee was prepped with alcohol swab and utilizing anterolateral approach, patient's left knee space was injected with 15 mg/2.5 mL of Orthovisc (sodium hyaluronate) in a prefilled syringe was injected easily into the knee through a 22-gauge needle. Patient tolerated the procedure well without immediate complications.  After informed written and verbal consent, patient was seated on exam table. Right knee was prepped with alcohol swab and utilizing anterolateral approach, patient's right knee space was injected with 15 mg/2.5 mL of Orthovisc (sodium hyaluronate) in a prefilled syringe was injected easily into the knee through a 22-gauge needle. Patient tolerated the procedure well without immediate complications.  Impression and Recommendations:     This case required medical decision making of moderate complexity.

## 2015-08-16 NOTE — Patient Instructions (Signed)
Good to see you 3/4 4 are done.  Stay active  See me again next week for last injection.

## 2015-08-16 NOTE — Progress Notes (Signed)
Pre visit review using our clinic review tool, if applicable. No additional management support is needed unless otherwise documented below in the visit note. 

## 2015-08-16 NOTE — Assessment & Plan Note (Signed)
Injected both knees again today. Encourage patient to continue to monitor the swelling. We discussed possible aspiration visit. We discussed icing regimen. Discussed custom bracing which patient declined. Patient does not when any surgical intervention. We'll see him again in 1 week for fourth and final in injection in series.

## 2015-08-23 ENCOUNTER — Ambulatory Visit (INDEPENDENT_AMBULATORY_CARE_PROVIDER_SITE_OTHER)
Admission: RE | Admit: 2015-08-23 | Discharge: 2015-08-23 | Disposition: A | Discharge status: Home / Self Care | Payer: Medicare Other | Source: Ambulatory Visit | Attending: Family Medicine | Admitting: Family Medicine

## 2015-08-23 ENCOUNTER — Ambulatory Visit (INDEPENDENT_AMBULATORY_CARE_PROVIDER_SITE_OTHER): Payer: Medicare Other | Admitting: Family Medicine

## 2015-08-23 ENCOUNTER — Encounter: Payer: Self-pay | Admitting: Family Medicine

## 2015-08-23 VITALS — BP 130/82 | HR 83 | Ht 77.0 in | Wt 269.0 lb

## 2015-08-23 DIAGNOSIS — M170 Bilateral primary osteoarthritis of knee: Secondary | ICD-10-CM | POA: Diagnosis not present

## 2015-08-23 DIAGNOSIS — M25569 Pain in unspecified knee: Secondary | ICD-10-CM | POA: Diagnosis not present

## 2015-08-23 DIAGNOSIS — M1710 Unilateral primary osteoarthritis, unspecified knee: Secondary | ICD-10-CM | POA: Diagnosis not present

## 2015-08-23 NOTE — Progress Notes (Signed)
  I'm seeing this patient by the request  of:  [name], MD  CC: Knee pain follow up  HPI: Patient is a 69 year old gentleman coming in for followup of his osteoarthritis of the left knee.  Known osteoarthritis of the knees bilaterally. Patient is here for a fourth and final injection in the knees bilaterally with viscous supplementation. Patient states he has not had any significant improvement in standing discontinue the viscous supplementation. Patient was avoid any type of surgery. Patient knows he does have severe arthritis of the medial compartment bilaterally. Once again continue with conservative therapy. Patient is wondering what else can be done.  Past medical, surgical, family and social history reviewed. Medications reviewed all in the electronic medical record.   Review of Systems: No headache, visual changes, nausea, vomiting, diarrhea, constipation, dizziness, abdominal pain, skin rash, fevers, chills, night sweats, weight loss, swollen lymph nodes, body aches, joint swelling, muscle aches, chest pain, shortness of breath, mood changes.   Objective:    Blood pressure 130/82, pulse 83, height 6\' 5"  (1.956 m), weight 269 lb (122.018 kg), SpO2 98 %.   General: No apparent distress alert and oriented x3 mood and affect normal, dressed appropriately.  HEENT: Pupils equal, extraocular movements intact Respiratory: Patient's speak in full sentences and does not appear short of breath Cardiovascular: No lower extremity edema, non tender, no erythema Skin: Warm dry intact with no signs of infection or rash on extremities or on axial skeleton. Abdomen: Soft nontender Neuro: Cranial nerves II through XII are intact, neurovascularly intact in all extremities with 2+ DTRs and 2+ pulses. Lymph: No lymphadenopathy of posterior or anterior cervical chain or axillae bilaterally.  Gait normal with good balance and coordination.  MSK: Non tender with full range of motion and good stability and  symmetric strength and tone of shoulders, elbows, wrist, hip, and ankles bilaterally.  Knee: bilateral Inspection shows the patient does have osteoarthritic changes of the knees bilaterally left greater than right patient also has an effusion of the left knee still present with no significant change Patient does have tenderness to palpation over the medial joint line. Minorly worse than previous exam ROM full in flexion and extension and lower leg rotation. Ligaments with solid consistent endpoints including ACL, PCL, LCL, MCL. Positive Mcmurray's, Apley's, and Thessalonian tests. Painful patellar compression. Patellar glide with significant crepitus. Patellar and quadriceps tendons unremarkable. Hamstring and quadriceps strength is normal.  No significant change from previous exam.    After informed written and verbal consent, patient was seated on exam table. Left knee was prepped with alcohol swab and utilizing anterolateral approach, patient's left knee space was injected with 4:1  marcaine 0.5%: Kenalog 40mg /dL. Patient tolerated the procedure well without immediate complications.  After informed written and verbal consent, patient was seated on exam table. Right knee was prepped with alcohol swab and utilizing anterolateral approach, patient's right knee space was injected with 4:1  marcaine 0.5%: Kenalog 40mg /dL. Patient tolerated the procedure well without immediate complications.  Impression and Recommendations:     This case required medical decision making of moderate complexity.

## 2015-08-23 NOTE — Assessment & Plan Note (Signed)
Bilateral injections given today. Patient has stopped the viscous supplementation because he is not making any improvement. We discussed icing regimen. We discussed the possibility of formal physical therapy which patient declined. We discussed referral to orthopedic surgery for surgical intervention the patient also declined. We also discussed custom bracing which patient declined. Encourage patient to continue to stay active. We discussed over-the-counter medications and diet changes that could be helpful. Patient and will follow-up with me again in 4 weeks. Patient knows that we can do an injection every 10 weeks if necessary.  Spent  25 minutes with patient face-to-face and had greater than 50% of counseling including as described above in assessment and plan.

## 2015-08-23 NOTE — Patient Instructions (Signed)
Great to see you I am sorry no better and I hoping this does the trick.  Ice is your friend Stay active I want to see you again in 4 weeks.  If not better can call earlier but only thing left would be MRI or referral to ortho.

## 2015-08-25 ENCOUNTER — Telehealth: Payer: Self-pay | Admitting: *Deleted

## 2015-08-25 DIAGNOSIS — L570 Actinic keratosis: Secondary | ICD-10-CM | POA: Diagnosis not present

## 2015-08-25 DIAGNOSIS — Z85828 Personal history of other malignant neoplasm of skin: Secondary | ICD-10-CM | POA: Diagnosis not present

## 2015-08-25 DIAGNOSIS — D485 Neoplasm of uncertain behavior of skin: Secondary | ICD-10-CM | POA: Diagnosis not present

## 2015-08-25 DIAGNOSIS — L821 Other seborrheic keratosis: Secondary | ICD-10-CM | POA: Diagnosis not present

## 2015-08-25 DIAGNOSIS — D692 Other nonthrombocytopenic purpura: Secondary | ICD-10-CM | POA: Diagnosis not present

## 2015-08-25 MED ORDER — HYDROCODONE-HOMATROPINE 5-1.5 MG/5ML PO SYRP: 5.0000 mL | ORAL_SOLUTION | Freq: Four times a day (QID) | ORAL | Status: DC | PRN

## 2015-08-25 NOTE — Telephone Encounter (Signed)
Done hardcopy to Dahlia  

## 2015-08-25 NOTE — Telephone Encounter (Signed)
Left msg on triage requesting refill on his Hycodan cough syrup...Dennis Hill

## 2015-08-26 ENCOUNTER — Telehealth: Payer: Self-pay | Admitting: Internal Medicine

## 2015-08-26 MED ORDER — HYDROCODONE-HOMATROPINE 5-1.5 MG/5ML PO SYRP: 5.0000 mL | ORAL_SOLUTION | Freq: Four times a day (QID) | ORAL | Status: DC | PRN

## 2015-08-26 NOTE — Telephone Encounter (Signed)
Called pt no answer LMOM rx [facility] to [facility].../lmb

## 2015-08-26 NOTE — Telephone Encounter (Signed)
Reprinted script called pt inform will have to pick-up from office.Marland KitchenJohny Hill

## 2015-08-26 NOTE — Telephone Encounter (Signed)
[facility] pharmacy called state that we fax in HYDROcodone-homatropine [facility]), this is a Control II class, they can not take it by fax.

## 2015-09-17 ENCOUNTER — Telehealth: Payer: Self-pay | Admitting: Internal Medicine

## 2015-09-17 DIAGNOSIS — E119 Type 2 diabetes mellitus without complications: Secondary | ICD-10-CM

## 2015-09-17 NOTE — Telephone Encounter (Signed)
Ok, done per pt request

## 2015-09-17 NOTE — Telephone Encounter (Signed)
Patient is requesting to be referred to a diabetes specialist

## 2015-09-22 NOTE — Telephone Encounter (Signed)
Notified patient.

## 2015-10-06 ENCOUNTER — Ambulatory Visit (INDEPENDENT_AMBULATORY_CARE_PROVIDER_SITE_OTHER): Payer: Medicare Other | Admitting: Endocrinology

## 2015-10-06 ENCOUNTER — Encounter: Payer: Self-pay | Admitting: Endocrinology

## 2015-10-06 VITALS — BP 136/84 | HR 91 | Temp 98.0°F | Ht 77.0 in | Wt 272.0 lb

## 2015-10-06 DIAGNOSIS — E119 Type 2 diabetes mellitus without complications: Secondary | ICD-10-CM | POA: Diagnosis not present

## 2015-10-06 LAB — POCT GLYCOSYLATED HEMOGLOBIN (HGB A1C): Hemoglobin A1C: 6.3

## 2015-10-06 MED ORDER — METFORMIN HCL ER 500 MG PO TB24: 500.0000 mg | ORAL_TABLET | Freq: Every day | ORAL | Status: DC

## 2015-10-06 NOTE — Progress Notes (Signed)
Subjective:    Patient ID: Dennis Hill, male    DOB: 1946-04-02, 70 y.o.   MRN: [id number]  HPI pt states DM was dx'ed in 2010; he has mild if any neuropathy of the lower extremities; he is unaware of any associated chronic complications; he has never been medication for this; pt says his diet and exercise are are "fair;" he has never had pancreatitis, severe hypoglycemia or DKA.  Past Medical History  Diagnosis Date  . DIABETES MELLITUS, TYPE II 01/28/2010    diet controlled  . DEPRESSION 03/15/2009  . GERD 03/15/2009  . DIVERTICULOSIS, COLON 03/15/2009  . 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 Hx of adenomatous colonic polyps 03/15/2009  . NEPHROLITHIASIS, HX OF 01/28/2010  . Anxiety   . Chronic insomnia   . Fatty liver   . Internal hemorrhoids   . Hyperlipidemia 02/10/2011  . Atrial bigeminy 2008    Dr. Caryl Comes  . Atrial fibrillation (HCC) flutter   . Pneumonia ~ 1950  . Sleep apnea   . [id number])     "monthly" (03/06/2013)  . BPH (benign prostatic hypertrophy) 03/21/2013  . Hx of cardiovascular stress test     ETT-Myoview (9/15):  Inferior infarct, no ischemia, EF 41% - Intermediate Risk  . CAD (coronary artery disease)     LHC (10/15):  pLAD 20%, mCFX 20%, pRCA 20%, PDA 20%, EF 40%  . NICM (nonischemic cardiomyopathy) ([place])     Echo (10/15):  Mild LVH, EF 35-40%, diff HK    Past Surgical History  Procedure Laterality Date  . Appendectomy    . Cholecystectomy  2006  . Tonsillectomy and adenoidectomy  ~ 1952  . Inguinal hernia repair Right 1986  . Cardioversion N/A 02/06/2013    Procedure: CARDIOVERSION;  Surgeon: Evans Lance, MD;  Location: [facility];  Service: Cardiovascular;  Laterality: N/A;  . Tee without cardioversion N/A 03/05/2013    Procedure: TRANSESOPHAGEAL ECHOCARDIOGRAM (TEE);  Surgeon: Larey Dresser, MD;  Location: [facility];  Service: Cardiovascular;  Laterality: N/A;  Jeannene Patella Greggory Brandy  . Atrial flutter ablation  03/06/2013  . Atrial flutter  ablation N/A 03/06/2013    Procedure: ATRIAL FLUTTER ABLATION;  Surgeon: Deboraha Sprang, MD;  Location[facility] CATH LAB;  Service: Cardiovascular;  Laterality: N/A;  . Left heart catheterization with coronary angiogram N/A 06/19/2014    Procedure: LEFT HEART CATHETERIZATION WITH CORONARY ANGIOGRAM;  Surgeon: Burnell Blanks, MD;  Location: [facility] CATH LAB;  Service: Cardiovascular;  Laterality: N/A;    Social History   Social History  . Marital Status: Married    Spouse Name: N/A  . Number of Children: 2  . Years of Education: N/A   Occupational History  . Manufacturers Rep for [employer]    Social History Main Topics  . Smoking status: Former Smoker -- 1.00 packs/day for 13 years    Types: Cigarettes    Quit date: 12/25/1979  . Smokeless tobacco: Never Used  . Alcohol Use: 0.6 oz/week    1 Cans of beer per week     Comment: 03/06/2013 "glass of wine maybe once a month; glass of beer/wk"  . Drug Use: No  . Sexual Activity: Not Currently   Other Topics Concern  . Not on file   Social History Narrative   Step daughter and 2 77 yo twins live with     Current Outpatient Prescriptions on File Prior to Visit  Medication Sig Dispense Refill  . acetaminophen (TYLENOL) 500 MG tablet Take 500  mg by mouth every 6 (six) hours as needed for pain.    Marland Kitchen albuterol (PROVENTIL HFA;VENTOLIN HFA) 108 (90 BASE) MCG/ACT inhaler Inhale 2 puffs into the lungs every 6 (six) hours as needed for wheezing or shortness of breath. 1 Inhaler 5  . Cyanocobalamin (VITAMIN B-12 PO) Take 1 tablet by mouth daily.    Marland Kitchen esomeprazole (NEXIUM) 40 MG capsule Take 40 mg by mouth daily as needed (HEARTBURN).    . fluticasone (FLONASE) 50 MCG/ACT nasal spray Place 2 sprays into both nostrils daily. (Patient taking differently: Place 2 sprays into both nostrils daily as needed for allergies or rhinitis. ) 16 g 2  . HYDROcodone-homatropine (HYCODAN) 5-1.5 MG/5ML syrup Take 5 mLs by mouth every 6 (six) hours as needed for  cough. 180 mL 0  . nitroGLYCERIN (NITROSTAT) 0.4 MG SL tablet Place 1 tablet (0.4 mg total) under the tongue every 5 (five) minutes as needed for chest pain. 25 tablet 3  . Probiotic Product (PROBIOTIC PO) Take 1 capsule by mouth daily.    . Pyridoxine HCl (B-6 PO) Take 1 tablet by mouth daily.    . tamsulosin (FLOMAX) 0.4 MG CAPS capsule Take 1 capsule (0.4 mg total) by mouth daily. 90 capsule 3   No current facility-administered medications on file prior to visit.    Allergies  Allergen Reactions  . Lipitor [Atorvastatin] Other (See Comments)    Mental foggy  . Lisinopril Other (See Comments)    Tongue swelling  . Xarelto [Rivaroxaban]     Tongue swelling    Family History  Problem Relation Age of Onset  . Lung cancer Mother 70  . Colon cancer Mother   . CAD Neg Hx   . Heart attack Neg Hx   . Stroke Neg Hx   . Cancer Mother   . Cancer Maternal Grandmother   . Diabetes Neg Hx     BP 136/84 mmHg  Pulse 91  Temp(Src) 98 F (36.7 C) (Oral)  Ht 6\' 5"  (1.956 m)  Wt 272 lb (123.378 kg)  BMI 32.25 kg/m2  SpO2 96%   Review of Systems denies blurry vision, headache, chest pain, sob, n/v, urinary frequency, excessive diaphoresis, depression, cold intolerance, and rhinorrhea.   He has gained a few lbs.  He has intermittent leg cramps and easy bruising.    Objective:   Physical Exam VS: see vs page GEN: no distress HEAD: head: no deformity eyes: no periorbital swelling, no proptosis external nose and ears are normal mouth: no lesion seen NECK: supple, thyroid is not enlarged CHEST WALL: no deformity LUNGS: clear to auscultation BREASTS:  No gynecomastia CV: reg rate and rhythm, no murmur ABD: abdomen is soft, nontender.  no hepatosplenomegaly.  not distended.  no hernia MUSCULOSKELETAL: muscle bulk and strength are grossly normal.  no obvious joint swelling.  gait is normal and steady EXTEMITIES: no deformity.  no ulcer on the feet.  feet are of normal color and temp.   1+ bilat leg edema, and bilat varicosities. PULSES: dorsalis pedis intact bilat.  no carotid bruit NEURO:  cn 2-12 grossly intact.   readily moves all 4's.  sensation is intact to touch on the feet SKIN:  Normal texture and temperature.  No rash or suspicious lesion is visible.  Few ecchymoses of the forearms.   NODES:  None palpable at the neck PSYCH: alert, well-oriented.  Does not appear anxious nor depressed.  A1c=6.3%  Lab Results  Component Value Date   CREATININE 1.22 01/14/2015  BUN 22 01/14/2015   NA 140 01/14/2015   K 4.3 01/14/2015   CL 106 01/14/2015   CO2 27 01/14/2015   i personally reviewed electrocardiogram tracing (07/13/15): Indication: atrial flutter status post ablation and mild LV dysfunction Impression: nonspecific ST and T changes    Assessment & Plan:  DM: new to me.  He would benefit from metformin.    Edema: this limits oral rx options.  Renal insuff: this also limits oral rx options.    Patient is advised the following: Patient Instructions  good diet and exercise significantly improve the control of your blood sugar.  please let me know if you wish to be referred to a dietician.  high blood sugar is very risky to your health.  you should see an eye doctor and dentist every year.  It is very important to get all recommended vaccinations.   controlling your blood pressure and cholesterol drastically reduces the damage diabetes does to your body.  Those who smoke should quit.  please discuss these with your doctor.  check your blood sugar twice a week.  vary the time of day when you check, between before the 3 meals, and at bedtime.  also check if you have symptoms of your blood sugar being too high or too low.  please keep a record of the readings and bring it to your next appointment here (or you can bring the meter itself).  You can write it on any piece of paper.  please call us sooner if your blood sugar goes below 70, or if you have a lot of readings  over 200.  i have sent a prescription to your pharmacy, to start metformin.  Please come back for a follow-up appointment in 3-4 months.

## 2015-10-06 NOTE — Patient Instructions (Addendum)
good diet and exercise significantly improve the control of your blood sugar.  please let me know if you wish to be referred to a dietician.  high blood sugar is very risky to your health.  you should see an eye doctor and dentist every year.  It is very important to get all recommended vaccinations.   controlling your blood pressure and cholesterol drastically reduces the damage diabetes does to your body.  Those who smoke should quit.  please discuss these with your doctor.  check your blood sugar twice a week.  vary the time of day when you check, between before the 3 meals, and at bedtime.  also check if you have symptoms of your blood sugar being too high or too low.  please keep a record of the readings and bring it to your next appointment here (or you can bring the meter itself).  You can write it on any piece of paper.  please call us sooner if your blood sugar goes below 70, or if you have a lot of readings over 200.  i have sent a prescription to your pharmacy, to start metformin.  Please come back for a follow-up appointment in 3-4 months.

## 2015-10-22 DIAGNOSIS — M170 Bilateral primary osteoarthritis of knee: Secondary | ICD-10-CM | POA: Diagnosis not present

## 2015-12-23 ENCOUNTER — Telehealth: Payer: Self-pay | Admitting: Internal Medicine

## 2015-12-23 NOTE — Telephone Encounter (Signed)
New message   Pt calling to inform Dr.Klein and RN   That he is having tingling in both LEGS (from the kalf down to his foot) for about a week   More in the rt leg   I made appt with PA 12/24/15

## 2015-12-23 NOTE — Telephone Encounter (Signed)
Noted  

## 2015-12-24 ENCOUNTER — Ambulatory Visit (INDEPENDENT_AMBULATORY_CARE_PROVIDER_SITE_OTHER): Payer: Medicare Other | Admitting: Physician Assistant

## 2015-12-24 ENCOUNTER — Encounter: Payer: Self-pay | Admitting: Physician Assistant

## 2015-12-24 VITALS — BP 130/80 | HR 86 | Ht 77.0 in | Wt 262.0 lb

## 2015-12-24 DIAGNOSIS — I255 Ischemic cardiomyopathy: Secondary | ICD-10-CM | POA: Diagnosis not present

## 2015-12-24 DIAGNOSIS — R600 Localized edema: Secondary | ICD-10-CM

## 2015-12-24 DIAGNOSIS — I4892 Unspecified atrial flutter: Secondary | ICD-10-CM | POA: Diagnosis not present

## 2015-12-24 NOTE — Progress Notes (Signed)
Cardiology Office Note Date:  12/24/2015  Patient ID:  Dennis Hill 1946-02-12, MRN [id number] PCP:  [name], MD  Cardiologist/Electrophysiologist:  Dr. Caryl Comes    Chief Complaint: tingling in his legs  History of Present Illness: Dennis Hill is a 70 y.o. male with history of PAFlutter s/p ablation, NICM, PVCs, DM, depression, HLD, is being seen today for Dr. Caryl Comes.  The patient called for the appointment with c/o tingling b/l LE, though he states that the tingling he has been having is particularly when sitting in speific chair that seems to press in on his b/l hamstrings and will feel like his legs fall asleep/tingle that resolves with getting up, otherwise denies any tingling to me or numbness.  He states he came in today particularly noting in the last 4 days swelling and redness to his RLE especially.  He remarks that he has gout historically and the pain to his great toe and that area of his foot is sore and tender, but the welling continues to increase so he wanted to be seen and could not get through to his PMD office. He denies any recent travel or injury.  He denies any kind of CP, palpitations or SOB, no dizziness, near syncope or syncope.   AFlutter Hx: Ablation June 2014, Dr. Caryl Comes DCCV 2014   Past Medical History  Diagnosis Date  . DIABETES MELLITUS, TYPE II 01/28/2010    diet controlled  . DEPRESSION 03/15/2009  . GERD 03/15/2009  . DIVERTICULOSIS, COLON 03/15/2009  . 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 Hx of adenomatous colonic polyps 03/15/2009  . NEPHROLITHIASIS, HX OF 01/28/2010  . Anxiety   . Chronic insomnia   . Fatty liver   . Internal hemorrhoids   . Hyperlipidemia 02/10/2011  . Atrial bigeminy 2008    Dr. Caryl Comes  . Atrial fibrillation (HCC) flutter   . Pneumonia ~ 1950  . Sleep apnea   . [id number])     "monthly" (03/06/2013)  . BPH (benign prostatic hypertrophy) 03/21/2013  . Hx of cardiovascular stress test     ETT-Myoview  (9/15):  Inferior infarct, no ischemia, EF 41% - Intermediate Risk  . CAD (coronary artery disease)     LHC (10/15):  pLAD 20%, mCFX 20%, pRCA 20%, PDA 20%, EF 40%  . NICM (nonischemic cardiomyopathy) ([place])     Echo (10/15):  Mild LVH, EF 35-40%, diff HK    Past Surgical History  Procedure Laterality Date  . Appendectomy    . Cholecystectomy  2006  . Tonsillectomy and adenoidectomy  ~ 1952  . Inguinal hernia repair Right 1986  . Cardioversion N/A 02/06/2013    Procedure: CARDIOVERSION;  Surgeon: Evans Lance, MD;  Location: [place];  Service: Cardiovascular;  Laterality: N/A;  . Tee without cardioversion N/A 03/05/2013    Procedure: TRANSESOPHAGEAL ECHOCARDIOGRAM (TEE);  Surgeon: Larey Dresser, MD;  Location: [place];  Service: Cardiovascular;  Laterality: N/A;  Jeannene Patella Greggory Brandy  . Atrial flutter ablation  03/06/2013  . Atrial flutter ablation N/A 03/06/2013    Procedure: ATRIAL FLUTTER ABLATION;  Surgeon: Deboraha Sprang, MD;  Location[facility] C[facility] LAB;  Service: Cardiovascular;  Laterality: N/A;  . Left heart catheterization with coronary angiogram N/A 06/19/2014    Procedure: LEFT HEART CATHETERIZATION WITH CORONARY ANGIOGRAM;  Surgeon: Burnell Blanks, MD;  Location: [facility] CATH LAB;  Service: Cardiovascular;  Laterality: N/A;    Current Outpatient Prescriptions  Medication Sig Dispense Refill  . acetaminophen (TYLENOL) 500 MG tablet  Take 500 mg by mouth every 6 (six) hours as needed for pain.    Marland Kitchen albuterol (PROVENTIL HFA;VENTOLIN HFA) 108 (90 BASE) MCG/ACT inhaler Inhale 2 puffs into the lungs every 6 (six) hours as needed for wheezing or shortness of breath. 1 Inhaler 5  . Cyanocobalamin (VITAMIN B-12 PO) Take 1 tablet by mouth daily.    Marland Kitchen esomeprazole (NEXIUM) 40 MG capsule Take 40 mg by mouth daily as needed (HEARTBURN).    . fluticasone (FLONASE) 50 MCG/ACT nasal spray Place 2 sprays into both nostrils daily. (Patient taking differently: Place 2 sprays into both nostrils daily  as needed for allergies or rhinitis. ) 16 g 2  . HYDROcodone-homatropine (HYCODAN) 5-1.5 MG/5ML syrup Take 5 mLs by mouth every 6 (six) hours as needed for cough. 180 mL 0  . metFORMIN (GLUCOPHAGE-XR) 500 MG 24 hr tablet Take 1 tablet (500 mg total) by mouth daily with breakfast. 90 tablet 3  . nitroGLYCERIN (NITROSTAT) 0.4 MG SL tablet Place 1 tablet (0.4 mg total) under the tongue every 5 (five) minutes as needed for chest pain. 25 tablet 3  . Pyridoxine HCl (B-6 PO) Take 1 tablet by mouth daily.    . tamsulosin (FLOMAX) 0.4 MG CAPS capsule Take 1 capsule (0.4 mg total) by mouth daily. 90 capsule 3   No current facility-administered medications for this visit.    Allergies:   Lipitor; Lisinopril; and Xarelto   Social History:  The patient  reports that he quit smoking about 36 years ago. His smoking use included Cigarettes. He has a 13 pack-year smoking history. He has never used smokeless tobacco. He reports that he drinks about 0.6 oz of alcohol per week. He reports that he does not use illicit drugs.   Family History:  The patient's family history includes Cancer in his maternal grandmother and mother; Colon cancer in his mother; Lung cancer (age of onset: 51) in his mother. There is no history of CAD, Heart attack, Stroke, or Diabetes.  ROS:  Please see the history of present illness.  All other systems are reviewed and otherwise negative.   PHYSICAL EXAM:  VS:  BP 130/80 mmHg  Pulse 86  Ht 6\' 5"  (1.956 m)  Wt 262 lb (118.842 kg)  BMI 31.06 kg/m2 BMI: Body mass index is 31.06 kg/(m^2). Well nourished, well developed, in no acute distress HEENT: normocephalic, atraumatic Neck: no JVD, carotid bruits or masses Cardiac:  normal S1, S2; RRR; no significant murmurs, no rubs, or gallops Lungs:  clear to auscultation bilaterally, no wheezing, rhonchi or rales Abd: soft, nontender MS: no deformity or atrophy Ext: RLE/foot is swollen to mid shin or just below, markedly erythematous  particularly to the great toe and surrounding area, erythema does not go beyond the mid foot, no calf tenderness or skin changes otherwise, no edema LLE. Skin: warm and dry, no rash Neuro:  No gross deficits appreciated Psych: euthymic mood, full affect   EKG:  Done today and reviewed by myself shows SR APCs, PVC  07/05/15: Echocardiogram Study Conclusions - Left ventricle: The cavity size was normal. There was mild  concentric hypertrophy. Systolic function was mildly to  moderately reduced. The estimated ejection fraction was in the  range of 40% to 45%. Diffuse hypokinesis. The study is not  technically sufficient to allow evaluation of LV diastolic  function. - Mitral valve: Transvalvular velocity was within the normal range.  There was no evidence for stenosis. There was no regurgitation. - Left atrium: The atrium was  moderately dilated. - Right ventricle: The cavity size was normal. Wall thickness was  normal. Systolic function was normal. - Atrial septum: No defect or patent foramen ovale was identified. - Tricuspid valve: There was trivial regurgitation. - Pulmonary arteries: PA peak pressure: 24 mm Hg (S). - Inferior vena cava: The vessel was normal in size. The  respirophasic diameter changes were in the normal range (>= 50%),  consistent with normal central venous pressure.  01/19/15: Holter Study Highlights    PACs 9000 PVCs 4500   PVC burden is not sufficiently high to explain cardiomyopaty    06/19/14: LHC Left Ventricular Angiogram: LVEF=40%.  Impression: 1. Mild non-obstructive CAD 2. Mild LV systolic dysfunction  Recent Labs: 01/14/2015: ALT 20; BUN 22; Creatinine, Ser 1.22; Hemoglobin 15.0; Platelets 231.0; Potassium 4.3; Sodium 140; TSH 2.19  07/05/2015: Cholesterol 142; HDL 34*; LDL Cholesterol 88; Total CHOL/HDL Ratio 4.2; Triglycerides 100; VLDL 20   CrCl cannot be calculated (Patient has no serum creatinine result on file.).   Wt  Readings from Last 3 Encounters:  12/24/15 262 lb (118.842 kg)  10/06/15 272 lb (123.378 kg)  08/23/15 269 lb (122.018 kg)     Other studies reviewed: Additional studies/records reviewed today include: summarized above  ASSESSMENT AND PLAN:  1. Hx of Aflutter ablated      no further symptoms since his ablation     holter 2016 did not discuss any findings of arrhythmias  2. NICM    Notes state he has been intolerant of BB (has stated he feels like his thoughts are clouded with them) and ACE unclear intolerance.      no CHF hx  3. NOD by cath     Notes remark that he is intolerant of statin (has stated they also make him feel like his thoughts are clouded)  4. DM      5. RLE edema/erythema     Patient remarks similar to his prior gout attack except for the significant swelling Discussed with the patient that we need to evaluate for possiblity of DVT/clot and am sending him to get done today [place] of his LE, he is agreeable and understands the importance of the evaluation.  Otherwise f/u with Dr. Caryl Comes iin 53mo, sooner if needed.   Disposition: Discussed with Dr. Burt Knack DOD today, the patient is being sent for stat venous doppler of the RLE to evaluate for DVT at the [facility] office, if negative the patient is to see his with his PMD for further evaluation/management.  Current medicines are reviewed at length with the patient today.  The patient did not have any concerns regarding medicines.  Dennis Lasso, PA-C 12/24/2015 2:20 PM     [address] [place] [address] [phone] (office)  [phone] (fax)

## 2015-12-24 NOTE — Patient Instructions (Signed)
Medication Instructions:   Your physician recommends that you continue on your current medications as directed. Please refer to the Current Medication list given to you today.   If you need a refill on your cardiac medications before your next appointment, please call your pharmacy.  Labwork:  NONE ORDER TODAY    Testing/Procedures:  Your physician has requested that you have a lower extremity arterial exercise duplex. During this test, exercise and ultrasound are used to evaluate arterial blood flow in the legs. Allow one hour for this exam. There are no restrictions or special instructions.    Follow-Up: Your physician wants you to follow-up in:  [place] will receive a reminder letter in the mail two months in advance. If you don't receive a letter, please call our office to schedule the follow-up appointment.      Any Other Special Instructions Will Be Listed Below (If Applicable).

## 2015-12-28 ENCOUNTER — Ambulatory Visit (HOSPITAL_COMMUNITY)
Admission: RE | Admit: 2015-12-28 | Discharge: 2015-12-28 | Disposition: A | Discharge status: Home / Self Care | Payer: Medicare Other | Source: Ambulatory Visit | Attending: Physician Assistant | Admitting: Physician Assistant

## 2015-12-28 ENCOUNTER — Telehealth: Payer: Self-pay | Admitting: Physician Assistant

## 2015-12-28 DIAGNOSIS — K219 Gastro-esophageal reflux disease without esophagitis: Secondary | ICD-10-CM | POA: Insufficient documentation

## 2015-12-28 DIAGNOSIS — I2510 Atherosclerotic heart disease of native coronary artery without angina pectoris: Secondary | ICD-10-CM | POA: Diagnosis not present

## 2015-12-28 DIAGNOSIS — R600 Localized edema: Secondary | ICD-10-CM | POA: Insufficient documentation

## 2015-12-28 DIAGNOSIS — E785 Hyperlipidemia, unspecified: Secondary | ICD-10-CM | POA: Insufficient documentation

## 2015-12-28 DIAGNOSIS — E119 Type 2 diabetes mellitus without complications: Secondary | ICD-10-CM | POA: Insufficient documentation

## 2015-12-28 NOTE — Telephone Encounter (Signed)
New Message:    She wanted you to know she is sending his doppler results over now,please be on the look out for them.

## 2015-12-30 ENCOUNTER — Telehealth: Payer: Self-pay | Admitting: Physician Assistant

## 2015-12-30 NOTE — Telephone Encounter (Signed)
Called the patient to give him his lower extremity US result, left a message for him to call when he could.  I have asked staff to f/u as well.   Tommye Standard, PAc

## 2015-12-31 ENCOUNTER — Telehealth: Payer: Self-pay | Admitting: *Deleted

## 2015-12-31 NOTE — Telephone Encounter (Signed)
-----   Message from [place], [place] sent at 12/30/2015  1:55 PM EDT ----- Thank you for confirming result is for Right lower extremity.  I tried to call the patient, no answer, I left a message, please call the patient and let him know the [place] of his leg was negative for any clots and he needs to follow up with his primary for further evaluation and care of his swelling and discomfort.     Thanks [employer]

## 2016-01-04 ENCOUNTER — Ambulatory Visit: Payer: Medicare Other | Admitting: Endocrinology

## 2016-01-11 ENCOUNTER — Encounter: Payer: Self-pay | Admitting: Internal Medicine

## 2016-01-11 ENCOUNTER — Ambulatory Visit (INDEPENDENT_AMBULATORY_CARE_PROVIDER_SITE_OTHER): Payer: Medicare Other | Admitting: Internal Medicine

## 2016-01-11 VITALS — BP 120/78 | HR 83 | Temp 98.7°F | Resp 20 | Wt 256.0 lb

## 2016-01-11 DIAGNOSIS — Z0001 Encounter for general adult medical examination with abnormal findings: Secondary | ICD-10-CM | POA: Diagnosis not present

## 2016-01-11 DIAGNOSIS — I872 Venous insufficiency (chronic) (peripheral): Secondary | ICD-10-CM

## 2016-01-11 DIAGNOSIS — Z1159 Encounter for screening for other viral diseases: Secondary | ICD-10-CM | POA: Diagnosis not present

## 2016-01-11 DIAGNOSIS — M10071 Idiopathic gout, right ankle and foot: Secondary | ICD-10-CM

## 2016-01-11 DIAGNOSIS — R6889 Other general symptoms and signs: Secondary | ICD-10-CM | POA: Diagnosis not present

## 2016-01-11 DIAGNOSIS — E119 Type 2 diabetes mellitus without complications: Secondary | ICD-10-CM

## 2016-01-11 DIAGNOSIS — E785 Hyperlipidemia, unspecified: Secondary | ICD-10-CM

## 2016-01-11 DIAGNOSIS — M109 Gout, unspecified: Secondary | ICD-10-CM

## 2016-01-11 MED ORDER — ASPIRIN EC 81 MG PO TBEC: 81.0000 mg | DELAYED_RELEASE_TABLET | Freq: Every day | ORAL | Status: DC

## 2016-01-11 MED ORDER — PREDNISONE 10 MG PO TABS: ORAL_TABLET | ORAL | Status: DC

## 2016-01-11 NOTE — Patient Instructions (Addendum)
Please take all new medication as prescribed - the prednisone  Please start the Aspirin 81 mg per day  Please start wearing the compression stockings, knee high 20-30 mmHg strength  Please continue all other medications as before, and refills have been done if requested.  Please have the pharmacy call with any other refills you may need.  Please continue your efforts at being more active, low cholesterol diet, and weight control.  Please keep your appointments with your specialists as you may have planned  Please go to the LAB in the Basement (turn left off the elevator) for the tests to be done at your convenience  You will be contacted by phone if any changes need to be made immediately.  Otherwise, you will receive a letter about your results with an explanation, but please check with MyChart first.  Please remember to sign up for MyChart if you have not done so, as this will be important to you in the future with finding out test results, communicating by private email, and scheduling acute appointments online when needed.  Please return in 6 months, or sooner if needed

## 2016-01-11 NOTE — Progress Notes (Signed)
Pre visit review using our clinic review tool, if applicable. No additional management support is needed unless otherwise documented below in the visit note. 

## 2016-01-11 NOTE — Progress Notes (Signed)
Subjective:    Patient ID: Dennis Hill, male    DOB: 1946-08-27, 70 y.o.   MRN: [id number]  HPI  Here for wellness and f/u;  Overall doing ok;  Pt denies Chest pain, worsening SOB, DOE, wheezing, orthopnea, PND, worsening LE edema, palpitations, dizziness or syncope.  Pt denies neurological change such as new headache, facial or extremity weakness.  Pt denies polydipsia, polyuria, or low sugar symptoms. Pt states overall good compliance with treatment and medications, good tolerability, and has been trying to follow appropriate diet.  Pt denies worsening depressive symptoms, suicidal ideation or panic. No fever, night sweats, wt loss, loss of appetite, or other constitutional symptoms.  Pt states good ability with ADL's, has low fall risk, home safety reviewed and adequate, no other significant changes in hearing or vision, and only occasionally active with exercise.  Also, here after 3 wks episode right first MTP gouty arthritis, has marked sewlling of whole distal foot, overall much improved but still with mild pain and swelling.  No longer limps to walk  Also with persistent mild LE edema with varicosities, worse to sit or stand in the day, better to lie down or keep legs elveated.  Not taking the metformin as A1c not more elevated and read bad things on internet..    Quit his asa after bruising to arms and hand Past Medical History  Diagnosis Date  . DIABETES MELLITUS, TYPE II 01/28/2010    diet controlled  . DEPRESSION 03/15/2009  . GERD 03/15/2009  . DIVERTICULOSIS, COLON 03/15/2009  . 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 Hx of adenomatous colonic polyps 03/15/2009  . NEPHROLITHIASIS, HX OF 01/28/2010  . Anxiety   . Chronic insomnia   . Fatty liver   . Internal hemorrhoids   . Hyperlipidemia 02/10/2011  . Atrial bigeminy 2008    Dr. Caryl Comes  . Atrial fibrillation (HCC) flutter   . Pneumonia ~ 1950  . Sleep apnea   . [id number])     "monthly" (03/06/2013)  . BPH  (benign prostatic hypertrophy) 03/21/2013  . Hx of cardiovascular stress test     ETT-Myoview (9/15):  Inferior infarct, no ischemia, EF 41% - Intermediate Risk  . CAD (coronary artery disease)     LHC (10/15):  pLAD 20%, mCFX 20%, pRCA 20%, PDA 20%, EF 40%  . NICM (nonischemic cardiomyopathy) ([place])     Echo (10/15):  Mild LVH, EF 35-40%, diff HK   Past Surgical History  Procedure Laterality Date  . Appendectomy    . Cholecystectomy  2006  . Tonsillectomy and adenoidectomy  ~ 1952  . Inguinal hernia repair Right 1986  . Cardioversion N/A 02/06/2013    Procedure: CARDIOVERSION;  Surgeon: Evans Lance, MD;  Location: [place];  Service: Cardiovascular;  Laterality: N/A;  . Tee without cardioversion N/A 03/05/2013    Procedure: TRANSESOPHAGEAL ECHOCARDIOGRAM (TEE);  Surgeon: Larey Dresser, MD;  Location: [place];  Service: Cardiovascular;  Laterality: N/A;  Jeannene Patella Greggory Brandy  . Atrial flutter ablation  03/06/2013  . Atrial flutter ablation N/A 03/06/2013    Procedure: ATRIAL FLUTTER ABLATION;  Surgeon: Deboraha Sprang, MD;  Location[facility] CATH LAB;  Service: Cardiovascular;  Laterality: N/A;  . Left heart catheterization with coronary angiogram N/A 06/19/2014    Procedure: LEFT HEART CATHETERIZATION WITH CORONARY ANGIOGRAM;  Surgeon: Burnell Blanks, MD;  Location: [facility] CATH LAB;  Service: Cardiovascular;  Laterality: N/A;    reports that he quit smoking about 36 years ago. His smoking use  included Cigarettes. He has a 13 pack-year smoking history. He has never used smokeless tobacco. He reports that he drinks about 0.6 oz of alcohol per week. He reports that he does not use illicit drugs. family history includes Cancer in his maternal grandmother and mother; Colon cancer in his mother; Lung cancer (age of onset: 51) in his mother. There is no history of CAD, Heart attack, Stroke, or Diabetes. Allergies  Allergen Reactions  . Lipitor [Atorvastatin] Other (See Comments)    Mental foggy  .  Lisinopril Other (See Comments)    Tongue swelling  . Xarelto [Rivaroxaban]     Tongue swelling   Current Outpatient Prescriptions on File Prior to Visit  Medication Sig Dispense Refill  . acetaminophen (TYLENOL) 500 MG tablet Take 500 mg by mouth every 6 (six) hours as needed for pain.    Marland Kitchen albuterol (PROVENTIL HFA;VENTOLIN HFA) 108 (90 BASE) MCG/ACT inhaler Inhale 2 puffs into the lungs every 6 (six) hours as needed for wheezing or shortness of breath. 1 Inhaler 5  . Cyanocobalamin (VITAMIN B-12 PO) Take 1 tablet by mouth daily.    Marland Kitchen esomeprazole (NEXIUM) 40 MG capsule Take 40 mg by mouth daily as needed (HEARTBURN).    . fluticasone (FLONASE) 50 MCG/ACT nasal spray Place 2 sprays into both nostrils daily. (Patient taking differently: Place 2 sprays into both nostrils daily as needed for allergies or rhinitis. ) 16 g 2  . HYDROcodone-homatropine (HYCODAN) 5-1.5 MG/5ML syrup Take 5 mLs by mouth every 6 (six) hours as needed for cough. 180 mL 0  . nitroGLYCERIN (NITROSTAT) 0.4 MG SL tablet Place 1 tablet (0.4 mg total) under the tongue every 5 (five) minutes as needed for chest pain. 25 tablet 3  . Pyridoxine HCl (B-6 PO) Take 1 tablet by mouth daily.    . tamsulosin (FLOMAX) 0.4 MG CAPS capsule Take 1 capsule (0.4 mg total) by mouth daily. 90 capsule 3   No current facility-administered medications on file prior to visit.   Review of Systems Constitutional: Negative for increased diaphoresis, or other activity, appetite or siginficant weight change other than noted HENT: Negative for worsening hearing loss, ear pain, facial swelling, mouth sores and neck stiffness.   Eyes: Negative for other worsening pain, redness or visual disturbance.  Respiratory: Negative for choking or stridor Cardiovascular: Negative for other chest pain and palpitations.  Gastrointestinal: Negative for worsening diarrhea, blood in stool, or abdominal distention Genitourinary: Negative for hematuria, flank pain or  change in urine volume.  Musculoskeletal: Negative for myalgias or other joint complaints.  Skin: Negative for other color change and wound or drainage.  Neurological: Negative for syncope and numbness. other than noted Hematological: Negative for adenopathy. or other swelling Psychiatric/Behavioral: Negative for hallucinations, SI, self-injury, decreased concentration or other worsening agitation.      Objective:   Physical Exam BP 120/78 mmHg  Pulse 83  Temp(Src) 98.7 F (37.1 C) (Oral)  Resp 20  Wt 256 lb (116.121 kg)  SpO2 97% VS noted,  Constitutional: Pt is oriented to person, place, and time. Appears well-developed and well-nourished, in no significant distress Head: Normocephalic and atraumatic  Eyes: Conjunctivae and EOM are normal. Pupils are equal, round, and reactive to light Right Ear: External ear normal.  Left Ear: External ear normal Nose: Nose normal.  Mouth/Throat: Oropharynx is clear and moist  Neck: Normal range of motion. Neck supple. No JVD present. No tracheal deviation present or significant neck LA or mass Cardiovascular: Normal rate, regular rhythm, normal  heart sounds and intact distal pulses.   Pulmonary/Chest: Effort normal and breath sounds without rales or wheezing  Abdominal: Soft. Bowel sounds are normal. NT. No HSM  Musculoskeletal: Normal range of motion. Exhibits no edema Lymphadenopathy: Has no cervical adenopathy.  Neurological: Pt is alert and oriented to person, place, and time. Pt has normal reflexes. No cranial nerve deficit. Motor grossly intact; right first MTP with 1+ erythema/tender/swelling Skin: Skin is warm and dry. No rash noted or new ulcers, right foot > left foot bluish hue c/w varicosities with trace edema Psychiatric:  Has normal mood and affect. Behavior is normal.     Assessment & Plan:

## 2016-01-12 NOTE — Assessment & Plan Note (Signed)
stable overall by history and exam, recent data reviewed with pt, and pt to continue medical treatment as before,  to f/u any worsening symptoms or concerns Lab Results  Component Value Date   HGBA1C 6.3 10/06/2015

## 2016-01-12 NOTE — Assessment & Plan Note (Signed)

## 2016-01-12 NOTE — Assessment & Plan Note (Signed)
Mild to mod, for predpac asd, pain control,  to f/u any worsening symptoms or concerns

## 2016-01-12 NOTE — Assessment & Plan Note (Signed)
Mild persistent on top of acute swelling with gout, and hx of cardiomyopathy, for compression stocking

## 2016-01-12 NOTE — Assessment & Plan Note (Signed)
stable overall by history and exam, recent data reviewed with pt, and pt to continue medical treatment as before,  to f/u any worsening symptoms or concerns Lab Results  Component Value Date   LDLCALC 88 07/05/2015

## 2016-01-17 DIAGNOSIS — Z85828 Personal history of other malignant neoplasm of skin: Secondary | ICD-10-CM | POA: Diagnosis not present

## 2016-01-17 DIAGNOSIS — L820 Inflamed seborrheic keratosis: Secondary | ICD-10-CM | POA: Diagnosis not present

## 2016-01-17 DIAGNOSIS — L821 Other seborrheic keratosis: Secondary | ICD-10-CM | POA: Diagnosis not present

## 2016-01-17 DIAGNOSIS — L570 Actinic keratosis: Secondary | ICD-10-CM | POA: Diagnosis not present

## 2016-01-18 ENCOUNTER — Other Ambulatory Visit (INDEPENDENT_AMBULATORY_CARE_PROVIDER_SITE_OTHER): Payer: Medicare Other

## 2016-01-18 DIAGNOSIS — Z1159 Encounter for screening for other viral diseases: Secondary | ICD-10-CM

## 2016-01-18 DIAGNOSIS — Z0001 Encounter for general adult medical examination with abnormal findings: Secondary | ICD-10-CM

## 2016-01-18 DIAGNOSIS — R6889 Other general symptoms and signs: Secondary | ICD-10-CM

## 2016-01-18 LAB — PSA: PSA: 3.45 ng/mL (ref 0.10–4.00)

## 2016-01-18 LAB — CBC WITH DIFFERENTIAL/PLATELET
BASOS PCT: 0.9 % (ref 0.0–3.0)
Basophils Absolute: 0.1 10*3/uL (ref 0.0–0.1)
Eosinophils Absolute: 0.3 10*3/uL (ref 0.0–0.7)
Eosinophils Relative: 4.4 % (ref 0.0–5.0)
HEMATOCRIT: 40.9 % (ref 39.0–52.0)
Hemoglobin: 13.7 g/dL (ref 13.0–17.0)
LYMPHS PCT: 24.3 % (ref 12.0–46.0)
Lymphs Abs: 1.8 10*3/uL (ref 0.7–4.0)
MCHC: 33.5 g/dL (ref 30.0–36.0)
MCV: 85.1 fl (ref 78.0–100.0)
MONOS PCT: 9.3 % (ref 3.0–12.0)
Monocytes Absolute: 0.7 10*3/uL (ref 0.1–1.0)
NEUTROS ABS: 4.6 10*3/uL (ref 1.4–7.7)
Neutrophils Relative %: 61.1 % (ref 43.0–77.0)
PLATELETS: 186 10*3/uL (ref 150.0–400.0)
RBC: 4.81 Mil/uL (ref 4.22–5.81)
RDW: 13.7 % (ref 11.5–15.5)
WBC: 7.5 10*3/uL (ref 4.0–10.5)

## 2016-01-18 LAB — HEPATIC FUNCTION PANEL
ALT: 16 U/L (ref 0–53)
AST: 17 U/L (ref 0–37)
Albumin: 4.1 g/dL (ref 3.5–5.2)
Alkaline Phosphatase: 90 U/L (ref 39–117)
BILIRUBIN DIRECT: 0.1 mg/dL (ref 0.0–0.3)
BILIRUBIN TOTAL: 0.6 mg/dL (ref 0.2–1.2)
TOTAL PROTEIN: 6.3 g/dL (ref 6.0–8.3)

## 2016-01-18 LAB — BASIC METABOLIC PANEL
BUN: 25 mg/dL — AB (ref 6–23)
CALCIUM: 9.9 mg/dL (ref 8.4–10.5)
CO2: 28 mEq/L (ref 19–32)
CREATININE: 1.17 mg/dL (ref 0.40–1.50)
Chloride: 107 mEq/L (ref 96–112)
GFR: 65.15 mL/min (ref >60.00)
Glucose, Bld: 142 mg/dL — ABNORMAL HIGH (ref 70–99)
Potassium: 4.4 mEq/L (ref 3.5–5.1)
Sodium: 141 mEq/L (ref 135–145)

## 2016-01-18 LAB — LIPID PANEL
CHOL/HDL RATIO: 4
Cholesterol: 122 mg/dL (ref 0–200)
HDL: 34 mg/dL — AB (ref >39.00)
LDL CALC: 69 mg/dL (ref 0–99)
NONHDL: 88.16
Triglycerides: 96 mg/dL (ref 0.0–149.0)
VLDL: 19.2 mg/dL (ref 0.0–40.0)

## 2016-01-18 LAB — URINALYSIS, ROUTINE W REFLEX MICROSCOPIC
HGB URINE DIPSTICK: NEGATIVE
Leukocytes, UA: NEGATIVE
Nitrite: NEGATIVE
Specific Gravity, Urine: >=1.03 — AB (ref 1.000–1.030)
Urine Glucose: NEGATIVE
Urobilinogen, UA: 1 (ref 0.0–1.0)
pH: 5.5 (ref 5.0–8.0)

## 2016-01-18 LAB — TSH: TSH: 1.34 u[IU]/mL (ref 0.35–4.50)

## 2016-01-19 LAB — HEPATITIS C ANTIBODY: HCV AB: NEGATIVE

## 2016-02-01 ENCOUNTER — Other Ambulatory Visit: Payer: Self-pay | Admitting: Orthopaedic Surgery

## 2016-02-10 ENCOUNTER — Encounter (HOSPITAL_COMMUNITY)
Admission: RE | Admit: 2016-02-10 | Discharge: 2016-02-10 | Disposition: A | Discharge status: Home / Self Care | Payer: Medicare Other | Source: Ambulatory Visit | Attending: Orthopaedic Surgery | Admitting: Orthopaedic Surgery

## 2016-02-10 ENCOUNTER — Other Ambulatory Visit (HOSPITAL_COMMUNITY): Payer: Self-pay | Admitting: *Deleted

## 2016-02-10 ENCOUNTER — Ambulatory Visit (INDEPENDENT_AMBULATORY_CARE_PROVIDER_SITE_OTHER): Payer: Medicare Other | Admitting: Internal Medicine

## 2016-02-10 ENCOUNTER — Encounter: Payer: Self-pay | Admitting: Internal Medicine

## 2016-02-10 ENCOUNTER — Encounter (HOSPITAL_COMMUNITY): Payer: Self-pay

## 2016-02-10 VITALS — BP 128/78 | HR 87 | Temp 98.2°F | Resp 20 | Wt 250.0 lb

## 2016-02-10 DIAGNOSIS — K760 Fatty (change of) liver, not elsewhere classified: Secondary | ICD-10-CM | POA: Insufficient documentation

## 2016-02-10 DIAGNOSIS — Z01818 Encounter for other preprocedural examination: Secondary | ICD-10-CM | POA: Diagnosis not present

## 2016-02-10 DIAGNOSIS — E119 Type 2 diabetes mellitus without complications: Secondary | ICD-10-CM

## 2016-02-10 DIAGNOSIS — E785 Hyperlipidemia, unspecified: Secondary | ICD-10-CM | POA: Insufficient documentation

## 2016-02-10 DIAGNOSIS — Z87891 Personal history of nicotine dependence: Secondary | ICD-10-CM | POA: Insufficient documentation

## 2016-02-10 DIAGNOSIS — Z79899 Other long term (current) drug therapy: Secondary | ICD-10-CM | POA: Insufficient documentation

## 2016-02-10 DIAGNOSIS — I2510 Atherosclerotic heart disease of native coronary artery without angina pectoris: Secondary | ICD-10-CM | POA: Insufficient documentation

## 2016-02-10 DIAGNOSIS — J309 Allergic rhinitis, unspecified: Secondary | ICD-10-CM | POA: Diagnosis not present

## 2016-02-10 DIAGNOSIS — R05 Cough: Secondary | ICD-10-CM | POA: Diagnosis not present

## 2016-02-10 DIAGNOSIS — M1712 Unilateral primary osteoarthritis, left knee: Secondary | ICD-10-CM | POA: Insufficient documentation

## 2016-02-10 DIAGNOSIS — G4733 Obstructive sleep apnea (adult) (pediatric): Secondary | ICD-10-CM | POA: Diagnosis not present

## 2016-02-10 DIAGNOSIS — Z01812 Encounter for preprocedural laboratory examination: Secondary | ICD-10-CM | POA: Diagnosis not present

## 2016-02-10 DIAGNOSIS — I428 Other cardiomyopathies: Secondary | ICD-10-CM | POA: Insufficient documentation

## 2016-02-10 DIAGNOSIS — I4891 Unspecified atrial fibrillation: Secondary | ICD-10-CM | POA: Diagnosis not present

## 2016-02-10 DIAGNOSIS — Z0183 Encounter for blood typing: Secondary | ICD-10-CM | POA: Diagnosis not present

## 2016-02-10 DIAGNOSIS — R062 Wheezing: Secondary | ICD-10-CM

## 2016-02-10 DIAGNOSIS — K219 Gastro-esophageal reflux disease without esophagitis: Secondary | ICD-10-CM | POA: Insufficient documentation

## 2016-02-10 DIAGNOSIS — R059 Cough, unspecified: Secondary | ICD-10-CM | POA: Insufficient documentation

## 2016-02-10 HISTORY — DX: Cardiac arrhythmia, unspecified: I49.9

## 2016-02-10 LAB — CBC WITH DIFFERENTIAL/PLATELET
BASOS PCT: 1 %
Basophils Absolute: 0 10*3/uL (ref 0.0–0.1)
EOS ABS: 0.2 10*3/uL (ref 0.0–0.7)
EOS PCT: 3 %
HCT: 43.1 % (ref 39.0–52.0)
Hemoglobin: 13.6 g/dL (ref 13.0–17.0)
Lymphocytes Relative: 24 %
Lymphs Abs: 1.8 10*3/uL (ref 0.7–4.0)
MCH: 27.7 pg (ref 26.0–34.0)
MCHC: 31.6 g/dL (ref 30.0–36.0)
MCV: 87.8 fL (ref 78.0–100.0)
MONO ABS: 0.7 10*3/uL (ref 0.1–1.0)
MONOS PCT: 9 %
NEUTROS PCT: 63 %
Neutro Abs: 4.8 10*3/uL (ref 1.7–7.7)
Platelets: 242 10*3/uL (ref 150–400)
RBC: 4.91 MIL/uL (ref 4.22–5.81)
RDW: 13.4 % (ref 11.5–15.5)
WBC: 7.5 10*3/uL (ref 4.0–10.5)

## 2016-02-10 LAB — URINALYSIS, ROUTINE W REFLEX MICROSCOPIC
GLUCOSE, UA: NEGATIVE mg/dL
Hgb urine dipstick: NEGATIVE
Ketones, ur: 15 mg/dL — AB
NITRITE: NEGATIVE
PH: 5 (ref 5.0–8.0)
PROTEIN: NEGATIVE mg/dL
Specific Gravity, Urine: 1.023 (ref 1.005–1.030)

## 2016-02-10 LAB — URINE MICROSCOPIC-ADD ON

## 2016-02-10 LAB — BASIC METABOLIC PANEL
Anion gap: 5 (ref 5–15)
BUN: 21 mg/dL — AB (ref 6–20)
CALCIUM: 10 mg/dL (ref 8.9–10.3)
CO2: 27 mmol/L (ref 22–32)
CREATININE: 1.06 mg/dL (ref 0.61–1.24)
Chloride: 107 mmol/L (ref 101–111)
GFR calc non Af Amer: >60 mL/min (ref >60)
GLUCOSE: 117 mg/dL — AB (ref 65–99)
Potassium: 4.2 mmol/L (ref 3.5–5.1)
Sodium: 139 mmol/L (ref 135–145)

## 2016-02-10 LAB — APTT: APTT: 29 s (ref 24–37)

## 2016-02-10 LAB — TYPE AND SCREEN
ABO/RH(D): O POS
Antibody Screen: NEGATIVE

## 2016-02-10 LAB — PROTIME-INR
INR: 1.12 (ref 0.00–1.49)
PROTHROMBIN TIME: 14.6 s (ref 11.6–15.2)

## 2016-02-10 LAB — SURGICAL PCR SCREEN
MRSA, PCR: NEGATIVE
STAPHYLOCOCCUS AUREUS: NEGATIVE

## 2016-02-10 LAB — ABO/RH: ABO/RH(D): O POS

## 2016-02-10 MED ORDER — HYDROCODONE-HOMATROPINE 5-1.5 MG/5ML PO SYRP: 5.0000 mL | ORAL_SOLUTION | Freq: Four times a day (QID) | ORAL | Status: DC | PRN

## 2016-02-10 MED ORDER — PREDNISONE 10 MG PO TABS: ORAL_TABLET | ORAL | Status: DC

## 2016-02-10 MED ORDER — AZITHROMYCIN 250 MG PO TABS: ORAL_TABLET | ORAL | Status: DC

## 2016-02-10 NOTE — Pre-Procedure Instructions (Signed)
Dennis Hill  02/10/2016      [facility] PHARMACY # [place], [place] [address] [place] [address] Phone: [phone] Fax: [phone]    Your procedure is scheduled on Tuesday, February 22, 2016 at 10:30 AM.  Report to [facility] Entrance "A" Admitting Office at 8:30 AM.  Call this number if you have problems the morning of surgery: [phone]  Any questions prior to day of surgery, please call [phone] between 8 & 4 PM.   Remember:  Do not eat food or drink liquids after midnight Monday, 02/21/16.  Take these medicines the morning of surgery with A SIP OF WATER: Tamsulosin (Flomax), Flonase, Nexium - if needed, Tylenol - if needed, Albuterol inhaler - if needed (Bring inhaler with you day of surgery).  Stop Aspirin 7 days prior to surgery. Do not use NSAIDS (Ibuprofen, Aleve, etc.) 7 days prior to surgery.   Do not wear jewelry.  Do not wear lotions, powders, or cologne.  You may wear deodorant.  Men may shave face and neck.  Do not bring valuables to the hospital.  [facility] is not responsible for any belongings or valuables.  Contacts, dentures or bridgework may not be worn into surgery.  Leave your suitcase in the car.  After surgery it may be brought to your room.  For patients admitted to the hospital, discharge time will be determined by your treatment team.  Special instructions:  [facility] - Preparing for Surgery  Before surgery, you can play an important role.  Because skin is not sterile, your skin needs to be as free of germs as possible.  You can reduce the number of germs on you skin by washing with CHG (chlorahexidine gluconate) soap before surgery.  CHG is an antiseptic cleaner which kills germs and bonds with the skin to continue killing germs even after washing.  Please DO NOT use if you have an allergy to CHG or antibacterial soaps.  If your skin becomes reddened/irritated stop using the CHG and  inform your nurse when you arrive at Short Stay.  Do not shave (including legs and underarms) for at least 48 hours prior to the first CHG shower.  You may shave your face.  Please follow these instructions carefully:   1.  Shower with CHG Soap the night before surgery and the                                morning of Surgery.  2.  If you choose to wash your hair, wash your hair first as usual with your       normal shampoo.  3.  After you shampoo, rinse your hair and body thoroughly to remove the                      Shampoo.  4.  Use CHG as you would any other liquid soap.  You can apply chg directly       to the skin and wash gently with scrungie or a clean washcloth.  5.  Apply the CHG Soap to your body ONLY FROM THE NECK DOWN.        Do not use on open wounds or open sores.  Avoid contact with your eyes, ears, mouth and genitals (private parts).  Wash genitals (private parts) with your normal soap.  6.  Wash thoroughly,  paying special attention to the area where your surgery        will be performed.  7.  Thoroughly rinse your body with warm water from the neck down.  8.  DO NOT shower/wash with your normal soap after using and rinsing off       the CHG Soap.  9.  Pat yourself dry with a clean towel.            10.  Wear clean pajamas.            11.  Place clean sheets on your bed the night of your first shower and do not        sleep with pets.  Day of Surgery  Do not apply any lotions the morning of surgery.  Please wear clean clothes to the hospital.   Please read over the following fact sheets that you were given. Pain Booklet, Coughing and Deep Breathing, MRSA Information and Surgical Site Infection Prevention

## 2016-02-10 NOTE — Patient Instructions (Signed)
You had the steroid shot today  Please take all new medication as prescribed - the antibiotic, cough medicine, and prednisone  Please continue all other medications as before, including the inhalers  Please have the pharmacy call with any other refills you may need.  Please keep your appointments with your specialists as you may have planned

## 2016-02-10 NOTE — Progress Notes (Signed)
Subjective:    Patient ID: Dennis Hill, male    DOB: Apr 08, 1946, 70 y.o.   MRN: [id number]  HPI Here with acute onset mild to mod 2-3 days ST, HA, general weakness and malaise, with prod cough greenish sputum, but Pt denies chest pain, increased sob or doe, wheezing, orthopnea, PND, increased LE swelling, palpitations, dizziness or syncope, except for worsening mild sob and wheezing since last PM.  Mentioned symptoms at earlier appt and cxr ordered - NAD.  Using inhalers more frequently for at least 2 days. Does have several wks ongoing nasal allergy symptoms with clearish congestion, itch and sneezing, without fever, pain, ST, cough, swelling or wheezing. Past Medical History  Diagnosis Date  . DEPRESSION 03/15/2009  . DIVERTICULOSIS, COLON 03/15/2009  . 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 Hx of adenomatous colonic polyps 03/15/2009  . NEPHROLITHIASIS, HX OF 01/28/2010  . Anxiety   . Chronic insomnia   . Fatty liver   . Hyperlipidemia 02/10/2011  . Atrial bigeminy 2008    Dr. Caryl Comes  . Atrial fibrillation (HCC) flutter   . Pneumonia ~ 1950  . Sleep apnea   . [id number])     "monthly" (03/06/2013)  . BPH (benign prostatic hypertrophy) 03/21/2013  . Hx of cardiovascular stress test     ETT-Myoview (9/15):  Inferior infarct, no ischemia, EF 41% - Intermediate Risk  . CAD (coronary artery disease)     LHC (10/15):  pLAD 20%, mCFX 20%, pRCA 20%, PDA 20%, EF 40%  . NICM (nonischemic cardiomyopathy) ([place])     Echo (10/15):  Mild LVH, EF 35-40%, diff HK  . GERD 03/15/2009    no medications now  . Dysrhythmia     seen by Dr Caryl Comes   . DIABETES MELLITUS, TYPE II 01/28/2010    diet controlled. states was never diagnosed as diabetic   Past Surgical History  Procedure Laterality Date  . Appendectomy    . Cholecystectomy  2006  . Tonsillectomy and adenoidectomy  ~ 1952  . Inguinal hernia repair Right 1986  . Cardioversion N/A 02/06/2013    Procedure: CARDIOVERSION;  Surgeon:  Evans Lance, MD;  Location: [place];  Service: Cardiovascular;  Laterality: N/A;  . Tee without cardioversion N/A 03/05/2013    Procedure: TRANSESOPHAGEAL ECHOCARDIOGRAM (TEE);  Surgeon: Larey Dresser, MD;  Location: [place];  Service: Cardiovascular;  Laterality: N/A;  Jeannene Patella Greggory Brandy  . Atrial flutter ablation  03/06/2013  . Atrial flutter ablation N/A 03/06/2013    Procedure: ATRIAL FLUTTER ABLATION;  Surgeon: Deboraha Sprang, MD;  Location: [facility] CATH LAB;  Service: Cardiovascular;  Laterality: N/A;  . Left heart catheterization with coronary angiogram N/A 06/19/2014    Procedure: LEFT HEART CATHETERIZATION WITH CORONARY ANGIOGRAM;  Surgeon: Burnell Blanks, MD;  Location: [facility] CATH LAB;  Service: Cardiovascular;  Laterality: N/A;    reports that he quit smoking about 36 years ago. His smoking use included Cigarettes. He has a 13 pack-year smoking history. He has never used smokeless tobacco. He reports that he drinks about 0.6 oz of alcohol per week. He reports that he does not use illicit drugs. family history includes Cancer in his maternal grandmother and mother; Colon cancer in his mother; Lung cancer (age of onset: 87) in his mother. There is no history of CAD, Heart attack, Stroke, or Diabetes. Allergies  Allergen Reactions  . Lipitor [Atorvastatin] Other (See Comments)    Mental foggy  . Lisinopril Other (See Comments)    Tongue swelling  .  Xarelto [Rivaroxaban]     Tongue swelling   Current Outpatient Prescriptions on File Prior to Visit  Medication Sig Dispense Refill  . acetaminophen (TYLENOL) 500 MG tablet Take 500 mg by mouth every 6 (six) hours as needed for pain.    Marland Kitchen albuterol (PROVENTIL HFA;VENTOLIN HFA) 108 (90 BASE) MCG/ACT inhaler Inhale 2 puffs into the lungs every 6 (six) hours as needed for wheezing or shortness of breath. 1 Inhaler 5  . aspirin EC 81 MG tablet Take 1 tablet (81 mg total) by mouth daily. 90 tablet 11  . esomeprazole (NEXIUM) 40 MG capsule Take  40 mg by mouth daily as needed (HEARTBURN).    . fluticasone (FLONASE) 50 MCG/ACT nasal spray Place 2 sprays into both nostrils daily. (Patient taking differently: Place 2 sprays into both nostrils daily as needed for allergies or rhinitis. ) 16 g 2  . nitroGLYCERIN (NITROSTAT) 0.4 MG SL tablet Place 1 tablet (0.4 mg total) under the tongue every 5 (five) minutes as needed for chest pain. 25 tablet 3  . tamsulosin (FLOMAX) 0.4 MG CAPS capsule Take 1 capsule (0.4 mg total) by mouth daily. 90 capsule 3   No current facility-administered medications on file prior to visit.   Review of Systems  Constitutional: Negative for unusual diaphoresis or night sweats HENT: Negative for ear swelling or discharge Eyes: Negative for worsening visual haziness  Respiratory: Negative for choking and stridor.   Gastrointestinal: Negative for distension or worsening eructation Genitourinary: Negative for retention or change in urine volume.  Musculoskeletal: Negative for other MSK pain or swelling Skin: Negative for color change and worsening wound Neurological: Negative for tremors and numbness other than noted  Psychiatric/Behavioral: Negative for decreased concentration or agitation other than above       Objective:   Physical Exam BP 128/78 mmHg  Pulse 87  Temp(Src) 98.2 F (36.8 C) (Oral)  Resp 20  Wt 250 lb (113.399 kg)  SpO2 97% VS noted, mild ill Constitutional: Pt appears in no apparent distress HENT: Head: NCAT.  Right Ear: External ear normal.  Left Ear: External ear normal.  Bilat tm's with mild erythema.  Max sinus areas mild tender.  Pharynx with mild erythema, no exudate Eyes: . Pupils are equal, round, and reactive to light. Conjunctivae and EOM are normal Neck: Normal range of motion. Neck supple.  Cardiovascular: Normal rate and regular rhythm.   Pulmonary/Chest: Effort normal and breath sounds decreased without rales but with mild bilat scattered wheezing.  Neurological: Pt is  alert. Not confused , motor grossly intact Skin: Skin is warm. No rash, no LE edema Psychiatric: Pt behavior is normal. No agitation.   Today Cxr: CLINICAL DATA: Preop evaluation for total knee arthroplasty.  EXAM: CHEST 2 VIEW  COMPARISON: 05/29/2014  FINDINGS: Normal heart size. There is no pleural effusion or edema identified. No airspace consolidation identified. Spondylosis noted within the thoracic spine. Eventration of the left hemidiaphragm noted.  IMPRESSION: 1. No active cardiopulmonary abnormalities.   Electronically Signed  By: Kerby Moors M.D.  On: 02/10/2016 09:38    Assessment & Plan:

## 2016-02-10 NOTE — Assessment & Plan Note (Signed)
Mild to mod, for steroid tx as above,,  to f/u any worsening symptoms or concerns

## 2016-02-10 NOTE — Assessment & Plan Note (Signed)
Mild to mod, c/w asthmatic bronchitis, for antibx, cough med prn,   to f/u any worsening symptoms or concerns

## 2016-02-10 NOTE — Pre-Procedure Instructions (Signed)
Dennis Hill  02/10/2016      [facility] PHARMACY # [place], [place] [address] [place] [address] Phone: [phone] Fax: [phone]    Your procedure is scheduled on Tuesday, February 22, 2016 at 10:30 AM.  Report to [facility] Entrance "A" Admitting Office at 8:30 AM.  Call this number if you have problems the morning of surgery: [phone]  Any questions prior to day of surgery, please call [phone] between 8 & 4 PM.   Remember:  Do not eat food or drink liquids after midnight Monday, 02/21/16.  Take these medicines the morning of surgery with A SIP OF WATER: Tamsulosin (Flomax), Flonase, Nexium - if needed, Tylenol - if needed, Albuterol inhaler - if needed (Bring inhaler with you day of surgery).  Stop Aspirin 7 days prior to surgery. Do not use NSAIDS (Ibuprofen, Aleve, etc.) 7 days prior to surgery.   Do not wear jewelry.  Do not wear lotions, powders, or cologne.  You may wear deodorant.  Men may shave face and neck.  Do not bring valuables to the hospital.  [facility] is not responsible for any belongings or valuables.  Contacts, dentures or bridgework may not be worn into surgery.  Leave your suitcase in the car.  After surgery it may be brought to your room.  For patients admitted to the hospital, discharge time will be determined by your treatment team.  Special instructions:  [facility] - Preparing for Surgery  Before surgery, you can play an important role.  Because skin is not sterile, your skin needs to be as free of germs as possible.  You can reduce the number of germs on you skin by washing with CHG (chlorahexidine gluconate) soap before surgery.  CHG is an antiseptic cleaner which kills germs and bonds with the skin to continue killing germs even after washing.  Please DO NOT use if you have an allergy to CHG or antibacterial soaps.  If your skin becomes reddened/irritated stop using the CHG and  inform your nurse when you arrive at Short Stay.  Do not shave (including legs and underarms) for at least 48 hours prior to the first CHG shower.  You may shave your face.  Please follow these instructions carefully:   1.  Shower with CHG Soap the night before surgery and the                                morning of Surgery.  2.  If you choose to wash your hair, wash your hair first as usual with your       normal shampoo.  3.  After you shampoo, rinse your hair and body thoroughly to remove the                      Shampoo.  4.  Use CHG as you would any other liquid soap.  You can apply chg directly       to the skin and wash gently with scrungie or a clean washcloth.  5.  Apply the CHG Soap to your body ONLY FROM THE NECK DOWN.        Do not use on open wounds or open sores.  Avoid contact with your eyes, ears, mouth and genitals (private parts).  Wash genitals (private parts) with your normal soap.  6.  Wash thoroughly,  paying special attention to the area where your surgery        will be performed.  7.  Thoroughly rinse your body with warm water from the neck down.  8.  DO NOT shower/wash with your normal soap after using and rinsing off       the CHG Soap.  9.  Pat yourself dry with a clean towel.            10.  Wear clean pajamas.            11.  Place clean sheets on your bed the night of your first shower and do not        sleep with pets.  Day of Surgery  Do not apply any lotions the morning of surgery.  Please wear clean clothes to the hospital.   Please read over the following fact sheets that you were given. Pain Booklet, Coughing and Deep Breathing, MRSA Information and Surgical Site Infection Prevention

## 2016-02-10 NOTE — Assessment & Plan Note (Signed)
C/w asthmatic bronchitis, for depomedrol IM, predpac asd,  to f/u any worsening symptoms or concerns

## 2016-02-10 NOTE — Assessment & Plan Note (Signed)
stable overall by history and exam, recent data reviewed with pt, and pt to continue medical treatment as before,  to f/u any worsening symptoms or concerns Lab Results  Component Value Date   HGBA1C 6.3 10/06/2015   Pt to call for onset polys or cbg > 200

## 2016-02-10 NOTE — Progress Notes (Signed)
Pre visit review using our clinic review tool, if applicable. No additional management support is needed unless otherwise documented below in the visit note. 

## 2016-02-10 NOTE — Progress Notes (Addendum)
Anesthesia Chart Review:  Pt is a 70 year old male scheduled for L total knee arthroplasty on 02/22/2016 with Dr. Rhona Raider.   PCP is Dr. [name]. EP cardiologist is Dr. [name].   PMH includes:  CAD (nonobstructive by 2015 cath), atrial fibrillation (s/p ablation), nonischemic cardiomyopathy, OSA, DM, hyperlipidemia, fatty liver, GERD. Former smoker. BMI 30.5  Medications include: albuterol, ASA, nexium  Preoperative labs reviewed.  Glucose 117, hgbA1c pending.   Chest x-ray 02/10/16 reviewed. No active cardiopulmonary disease.   EKG 12/24/15: sinus rhythm with PACS and PVCs or fusion complexes  Echo 07/05/15:  - Left ventricle: The cavity size was normal. There was mild concentric hypertrophy. Systolic function was mildly to moderately reduced. The estimated ejection fraction was in the range of 40% to 45%. Diffuse hypokinesis. The study is not technically sufficient to allow evaluation of LV diastolic function. - Mitral valve: Transvalvular velocity was within the normal range. There was no evidence for stenosis. There was no regurgitation. - Left atrium: The atrium was moderately dilated. - Right ventricle: The cavity size was normal. Wall thickness was normal. Systolic function was normal. - Atrial septum: No defect or patent foramen ovale was identified. - Tricuspid valve: There was trivial regurgitation. - Pulmonary arteries: PA peak pressure: 24 mm Hg (S). - Inferior vena cava: The vessel was normal in size. The respirophasic diameter changes were in the normal range (>= 50%), consistent with normal central venous pressure.  Cardiac cath 06/19/14:  1. Mild non-obstructive CAD (pLAD 20%, mCFX 20%, pRCA 20%, PDA 20%) 2. Mild LV systolic dysfunction  Pt had appt 02/10/16 with PCP for c/o cough, green sputum. CXR from that day is negative for pneumonia. Pt dx with asthmatic bronchitis, prescribed zithromax. I left voicemail for Juliann Pulse in Dr. Jerald Kief office with this information so  their office can re-evaluate pt prior to surgery. Surgery is scheduled for 11 days out from today; as long as his illness has resolved, I anticipate he can proceed with surgery as scheduled.   Willeen Cass, FNP-BC [facility] Short Stay Surgical Center/Anesthesiology Phone: [phone] 02/11/2016 1:41 PM

## 2016-02-10 NOTE — Progress Notes (Signed)
Cardiologist: Dr Caryl Comes   PCP Dr [name] ([place])  ECHO 07/05/15 EPIC Stress 06/10/14 EPIC Heart Cath 06/19/14  Sees Dr Renato Shin- endocrinologist.  States his A1c has stayed low and states he has not used the metformin as he has read about it online and doesn't want to take it.  Sleep study was done a few years ago, but states it was determined that he had restless leg syndrom rather than sleep apnea

## 2016-02-11 LAB — HEMOGLOBIN A1C
HEMOGLOBIN A1C: 6.6 % — AB (ref 4.8–5.6)
Mean Plasma Glucose: 143 mg/dL

## 2016-02-21 MED ORDER — DEXTROSE 5 % IV SOLN
3.0000 g | INTRAVENOUS | Status: AC
  Administered 2016-02-22: 3 g via INTRAVENOUS
  Filled 2016-02-21: qty 3000

## 2016-02-21 NOTE — H&P (Signed)
TOTAL KNEE ADMISSION H&P  Patient is being admitted for left total knee arthroplasty.  Subjective:  Chief Complaint:left knee pain.  HPI: Dennis Hill, 70 y.o. male, has a history of pain and functional disability in the left knee due to arthritis and has failed non-surgical conservative treatments for greater than 12 weeks to includeNSAID's and/or analgesics, corticosteriod injections, flexibility and strengthening excercises, weight reduction as appropriate and activity modification.  Onset of symptoms was gradual, starting 5 years ago with gradually worsening course since that time. The patient noted no past surgery on the left knee(s).  Patient currently rates pain in the left knee(s) at 10 out of 10 with activity. Patient has night pain, worsening of pain with activity and weight bearing, pain that interferes with activities of daily living, crepitus and joint swelling.  Patient has evidence of subchondral cysts, subchondral sclerosis, periarticular osteophytes and joint space narrowing by imaging studies. There is no active infection.  Patient Active Problem List   Diagnosis Date Noted  . Cough 02/10/2016  . Acute gout 01/11/2016  . Acute sinus infection 07/15/2015  . Pronation deformity of both feet 11/24/2014  . Wheezing 05/30/2014  . Thyroid mass 05/30/2014  . Chest pain 05/29/2014  . Encounter for preventative adult health care exam with abnormal findings 01/09/2014  . Recurrent epistaxis 10/15/2013  . Primary localized osteoarthrosis, lower leg 09/15/2013  . Angioedema 08/20/2013  . Venous insufficiency 08/20/2013  . Chronic meniscal tear of knee 08/12/2013  . Osteoarthritis of left knee 08/12/2013  . Kidney stones 03/21/2013  . BPH (benign prostatic hypertrophy) 03/21/2013  . Gross hematuria 02/21/2013  . Allergic rhinitis 02/21/2013  . Cardiomyopathy ([place]) 02/04/2013  . Obstructive sleep apnea 02/04/2013  . Atrial flutter ([place]) 12/11/2012  . Left knee pain  12/11/2012  . Left sided sciatica 12/11/2012  . Raynaud phenomenon 12/11/2012  . Increased prostate specific antigen (PSA) velocity 12/11/2012  . Hyperlipidemia 02/10/2011  . Hypersomnia 12/13/2010  . Dysphagia 12/13/2010  . Right flank pain 12/13/2010  . FOOT PAIN, LEFT 04/05/2010  . Diabetes ([address]) 01/28/2010  . 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 RUQ PAIN 01/28/2010  . NEPHROLITHIASIS, HX OF 01/28/2010  . GERD 03/15/2009  . DIVERTICULOSIS, COLON 03/15/2009  . ABDOMINAL PAIN OTHER SPECIFIED SITE 03/15/2009  . COLONIC POLYPS, HX OF 03/15/2009   Past Medical History  Diagnosis Date  . DEPRESSION 03/15/2009  . DIVERTICULOSIS, COLON 03/15/2009  . 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 Hx of adenomatous colonic polyps 03/15/2009  . NEPHROLITHIASIS, HX OF 01/28/2010  . Anxiety   . Chronic insomnia   . Fatty liver   . Hyperlipidemia 02/10/2011  . Atrial bigeminy 2008    Dr. Caryl Comes  . Atrial fibrillation (HCC) flutter   . Pneumonia ~ 1950  . Sleep apnea   . [id number])     "monthly" (03/06/2013)  . BPH (benign prostatic hypertrophy) 03/21/2013  . Hx of cardiovascular stress test     ETT-Myoview (9/15):  Inferior infarct, no ischemia, EF 41% - Intermediate Risk  . CAD (coronary artery disease)     LHC (10/15):  pLAD 20%, mCFX 20%, pRCA 20%, PDA 20%, EF 40%  . NICM (nonischemic cardiomyopathy) ([place])     Echo (10/15):  Mild LVH, EF 35-40%, diff HK  . GERD 03/15/2009    no medications now  . Dysrhythmia     seen by Dr Caryl Comes   . DIABETES MELLITUS, TYPE II 01/28/2010    diet controlled. states was never diagnosed as diabetic  Past Surgical History  Procedure Laterality Date  . Appendectomy    . Cholecystectomy  2006  . Tonsillectomy and adenoidectomy  ~ 1952  . Inguinal hernia repair Right 1986  . Cardioversion N/A 02/06/2013    Procedure: CARDIOVERSION;  Surgeon: Evans Lance, MD;  Location: [facility];  Service: Cardiovascular;  Laterality: N/A;  . Tee without  cardioversion N/A 03/05/2013    Procedure: TRANSESOPHAGEAL ECHOCARDIOGRAM (TEE);  Surgeon: Larey Dresser, MD;  Location: [facility];  Service: Cardiovascular;  Laterality: N/A;  Jeannene Patella Greggory Brandy  . Atrial flutter ablation  03/06/2013  . Atrial flutter ablation N/A 03/06/2013    Procedure: ATRIAL FLUTTER ABLATION;  Surgeon: Deboraha Sprang, MD;  Location: [facility] CATH LAB;  Service: Cardiovascular;  Laterality: N/A;  . Left heart catheterization with coronary angiogram N/A 06/19/2014    Procedure: LEFT HEART CATHETERIZATION WITH CORONARY ANGIOGRAM;  Surgeon: Burnell Blanks, MD;  Location: [facility] CATH LAB;  Service: Cardiovascular;  Laterality: N/A;    No prescriptions prior to admission   Allergies  Allergen Reactions  . Lisinopril Swelling    Tongue swelling  . Xarelto [Rivaroxaban] Swelling    Tongue swelling  . Lipitor [Atorvastatin] Other (See Comments)    Mental foggy    Social History  Substance Use Topics  . Smoking status: Former Smoker -- 1.00 packs/day for 13 years    Types: Cigarettes    Quit date: 12/25/1979  . Smokeless tobacco: Never Used  . Alcohol Use: 0.6 oz/week    1 Cans of beer per week     Comment: 03/06/2013 "glass of wine maybe once a month; glass of beer/wk"    Family History  Problem Relation Age of Onset  . Lung cancer Mother 76  . Colon cancer Mother   . CAD Neg Hx   . Heart attack Neg Hx   . Stroke Neg Hx   . Cancer Mother   . Cancer Maternal Grandmother   . Diabetes Neg Hx      Review of Systems  Musculoskeletal: Positive for joint pain.       Left knee  All other systems reviewed and are negative.   Objective:  Physical Exam  Constitutional: He is oriented to person, place, and time. He appears well-developed and well-nourished.  HENT:  Head: Normocephalic and atraumatic.  Eyes: Pupils are equal, round, and reactive to light.  Neck: Normal range of motion.  Cardiovascular: Normal rate and regular rhythm.   Respiratory: Effort normal.  GI:  Soft.  Musculoskeletal:  Both knees move from near full extension to about 110 of flexion.  He has mild varus deformities.  He has crepitation but no effusion.  He has medial and lateral joint line pain on both sides.  Hip motion is full on both sides and straight leg raise is negative.  Sensation and motor function are intact in his feet with palpable pulses on both sides.   Neurological: He is alert and oriented to person, place, and time.  Skin: Skin is warm and dry.  Psychiatric: He has a normal mood and affect. His behavior is normal. Judgment and thought content normal.    Vital signs in last 24 hours:    Labs:   Estimated body mass index is 30.35 kg/(m^2) as calculated from the following:   Height as of 12/24/15: 6\' 5"  (1.956 m).   Weight as of 01/11/16: 116.121 kg (256 lb).   Imaging Review Plain radiographs demonstrate severe degenerative joint disease of the left knee(s). The  overall alignment isneutral. The bone quality appears to be good for age and reported activity level.  Assessment/Plan:  End stage primary arthritis, left knee   The patient history, physical examination, clinical judgment of the provider and imaging studies are consistent with end stage degenerative joint disease of the left knee(s) and total knee arthroplasty is deemed medically necessary. The treatment options including medical management, injection therapy arthroscopy and arthroplasty were discussed at length. The risks and benefits of total knee arthroplasty were presented and reviewed. The risks due to aseptic loosening, infection, stiffness, patella tracking problems, thromboembolic complications and other imponderables were discussed. The patient acknowledged the explanation, agreed to proceed with the plan and consent was signed. Patient is being admitted for inpatient treatment for surgery, pain control, PT, OT, prophylactic antibiotics, VTE prophylaxis, progressive ambulation and ADL's and discharge  planning. The patient is planning to be discharged to skilled nursing facility

## 2016-02-22 ENCOUNTER — Encounter (HOSPITAL_COMMUNITY): Payer: Self-pay | Admitting: *Deleted

## 2016-02-22 ENCOUNTER — Inpatient Hospital Stay (HOSPITAL_COMMUNITY): Payer: Medicare Other | Admitting: Emergency Medicine

## 2016-02-22 ENCOUNTER — Encounter (HOSPITAL_COMMUNITY)
Admission: RE | Disposition: A | Discharge status: Skilled Nursing Facility | Payer: Self-pay | Source: Ambulatory Visit | Attending: Orthopaedic Surgery

## 2016-02-22 ENCOUNTER — Inpatient Hospital Stay (HOSPITAL_COMMUNITY)
Admission: RE | Admit: 2016-02-22 | Discharge: 2016-02-24 | DRG: 470 | Disposition: A | Discharge status: Skilled Nursing Facility | Payer: Medicare Other | Source: Ambulatory Visit | Attending: Orthopaedic Surgery | Admitting: Orthopaedic Surgery

## 2016-02-22 ENCOUNTER — Inpatient Hospital Stay (HOSPITAL_COMMUNITY): Payer: Medicare Other | Admitting: Anesthesiology

## 2016-02-22 DIAGNOSIS — E079 Disorder of thyroid, unspecified: Secondary | ICD-10-CM | POA: Diagnosis not present

## 2016-02-22 DIAGNOSIS — E119 Type 2 diabetes mellitus without complications: Secondary | ICD-10-CM | POA: Diagnosis not present

## 2016-02-22 DIAGNOSIS — Z79899 Other long term (current) drug therapy: Secondary | ICD-10-CM

## 2016-02-22 DIAGNOSIS — I4891 Unspecified atrial fibrillation: Secondary | ICD-10-CM | POA: Diagnosis not present

## 2016-02-22 DIAGNOSIS — K219 Gastro-esophageal reflux disease without esophagitis: Secondary | ICD-10-CM | POA: Diagnosis present

## 2016-02-22 DIAGNOSIS — M1712 Unilateral primary osteoarthritis, left knee: Principal | ICD-10-CM | POA: Diagnosis present

## 2016-02-22 DIAGNOSIS — M179 Osteoarthritis of knee, unspecified: Secondary | ICD-10-CM | POA: Diagnosis not present

## 2016-02-22 DIAGNOSIS — M6281 Muscle weakness (generalized): Secondary | ICD-10-CM | POA: Diagnosis not present

## 2016-02-22 DIAGNOSIS — Z87891 Personal history of nicotine dependence: Secondary | ICD-10-CM

## 2016-02-22 DIAGNOSIS — I2510 Atherosclerotic heart disease of native coronary artery without angina pectoris: Secondary | ICD-10-CM | POA: Diagnosis not present

## 2016-02-22 DIAGNOSIS — Z888 Allergy status to other drugs, medicaments and biological substances status: Secondary | ICD-10-CM

## 2016-02-22 DIAGNOSIS — Z96652 Presence of left artificial knee joint: Secondary | ICD-10-CM | POA: Diagnosis not present

## 2016-02-22 DIAGNOSIS — Z7984 Long term (current) use of oral hypoglycemic drugs: Secondary | ICD-10-CM

## 2016-02-22 DIAGNOSIS — R2681 Unsteadiness on feet: Secondary | ICD-10-CM | POA: Diagnosis not present

## 2016-02-22 DIAGNOSIS — M109 Gout, unspecified: Secondary | ICD-10-CM | POA: Diagnosis not present

## 2016-02-22 DIAGNOSIS — Z471 Aftercare following joint replacement surgery: Secondary | ICD-10-CM | POA: Diagnosis not present

## 2016-02-22 DIAGNOSIS — R262 Difficulty in walking, not elsewhere classified: Secondary | ICD-10-CM | POA: Diagnosis not present

## 2016-02-22 DIAGNOSIS — R040 Epistaxis: Secondary | ICD-10-CM | POA: Diagnosis not present

## 2016-02-22 HISTORY — PX: TOTAL KNEE ARTHROPLASTY: SHX125

## 2016-02-22 LAB — GLUCOSE, CAPILLARY
GLUCOSE-CAPILLARY: 126 mg/dL — AB (ref 65–99)
Glucose-Capillary: 124 mg/dL — ABNORMAL HIGH (ref 65–99)

## 2016-02-22 SURGERY — ARTHROPLASTY, KNEE, TOTAL
Anesthesia: General | Laterality: Left

## 2016-02-22 MED ORDER — PROPOFOL 10 MG/ML IV BOLUS
INTRAVENOUS | Status: AC
  Filled 2016-02-22: qty 20

## 2016-02-22 MED ORDER — 0.9 % SODIUM CHLORIDE (POUR BTL) OPTIME
TOPICAL | Status: DC | PRN
  Administered 2016-02-22: 1000 mL

## 2016-02-22 MED ORDER — HYDROCODONE-ACETAMINOPHEN 5-325 MG PO TABS
1.0000 | ORAL_TABLET | ORAL | Status: DC | PRN
  Administered 2016-02-22 – 2016-02-24 (×6): 2 via ORAL
  Filled 2016-02-22 (×5): qty 2

## 2016-02-22 MED ORDER — ONDANSETRON HCL 4 MG/2ML IJ SOLN
INTRAMUSCULAR | Status: AC
  Filled 2016-02-22: qty 2

## 2016-02-22 MED ORDER — HYDROMORPHONE HCL 1 MG/ML IJ SOLN
INTRAMUSCULAR | Status: AC
  Filled 2016-02-22: qty 1

## 2016-02-22 MED ORDER — FENTANYL CITRATE (PF) 250 MCG/5ML IJ SOLN
INTRAMUSCULAR | Status: AC
  Filled 2016-02-22: qty 5

## 2016-02-22 MED ORDER — SODIUM CHLORIDE 0.9 % IJ SOLN
INTRAMUSCULAR | Status: DC | PRN
  Administered 2016-02-22 (×4): 10 mL

## 2016-02-22 MED ORDER — METHOCARBAMOL 1000 MG/10ML IJ SOLN: 500.0000 mg | Freq: Four times a day (QID) | INTRAVENOUS | Status: DC | PRN

## 2016-02-22 MED ORDER — METHOCARBAMOL 500 MG PO TABS
ORAL_TABLET | ORAL | Status: AC
  Filled 2016-02-22: qty 1

## 2016-02-22 MED ORDER — ACETAMINOPHEN 650 MG RE SUPP: 650.0000 mg | Freq: Four times a day (QID) | RECTAL | Status: DC | PRN

## 2016-02-22 MED ORDER — TAMSULOSIN HCL 0.4 MG PO CAPS
0.4000 mg | ORAL_CAPSULE | Freq: Every day | ORAL | Status: DC
  Administered 2016-02-22 – 2016-02-24 (×3): 0.4 mg via ORAL
  Filled 2016-02-22 (×3): qty 1

## 2016-02-22 MED ORDER — BUPIVACAINE-EPINEPHRINE 0.25% -1:200000 IJ SOLN
INTRAMUSCULAR | Status: DC | PRN
  Administered 2016-02-22: 20 mL

## 2016-02-22 MED ORDER — MEPERIDINE HCL 25 MG/ML IJ SOLN: 6.2500 mg | INTRAMUSCULAR | Status: DC | PRN

## 2016-02-22 MED ORDER — PHENYLEPHRINE HCL 10 MG/ML IJ SOLN
INTRAMUSCULAR | Status: DC | PRN
  Administered 2016-02-22: 80 ug via INTRAVENOUS
  Administered 2016-02-22: 40 ug via INTRAVENOUS
  Administered 2016-02-22: 80 ug via INTRAVENOUS

## 2016-02-22 MED ORDER — PHENOL 1.4 % MT LIQD: 1.0000 | OROMUCOSAL | Status: DC | PRN

## 2016-02-22 MED ORDER — CEFAZOLIN SODIUM-DEXTROSE 2-4 GM/100ML-% IV SOLN
2.0000 g | Freq: Four times a day (QID) | INTRAVENOUS | Status: AC
  Administered 2016-02-22 – 2016-02-23 (×2): 2 g via INTRAVENOUS
  Filled 2016-02-22 (×3): qty 100

## 2016-02-22 MED ORDER — DEXAMETHASONE SODIUM PHOSPHATE 10 MG/ML IJ SOLN
INTRAMUSCULAR | Status: AC
Start: 2016-02-22 — End: 2016-02-22
  Filled 2016-02-22: qty 1

## 2016-02-22 MED ORDER — PANTOPRAZOLE SODIUM 40 MG PO TBEC
80.0000 mg | DELAYED_RELEASE_TABLET | Freq: Every day | ORAL | Status: DC
  Administered 2016-02-23 – 2016-02-24 (×2): 80 mg via ORAL
  Filled 2016-02-22 (×2): qty 2

## 2016-02-22 MED ORDER — FENTANYL CITRATE (PF) 100 MCG/2ML IJ SOLN
INTRAMUSCULAR | Status: AC
  Administered 2016-02-22 (×3): 50 ug via INTRAVENOUS
  Administered 2016-02-22: 100 ug via INTRAVENOUS
  Filled 2016-02-22: qty 2

## 2016-02-22 MED ORDER — ONDANSETRON HCL 4 MG/2ML IJ SOLN: 4.0000 mg | Freq: Once | INTRAMUSCULAR | Status: DC | PRN

## 2016-02-22 MED ORDER — HYDROMORPHONE HCL 1 MG/ML IJ SOLN
INTRAMUSCULAR | Status: DC | PRN
  Administered 2016-02-22 (×3): .25 mg via INTRAVENOUS

## 2016-02-22 MED ORDER — SODIUM CHLORIDE 0.9 % IJ SOLN
INTRAMUSCULAR | Status: AC
  Filled 2016-02-22: qty 10

## 2016-02-22 MED ORDER — MIDAZOLAM HCL 2 MG/2ML IJ SOLN
INTRAMUSCULAR | Status: AC
  Filled 2016-02-22: qty 2

## 2016-02-22 MED ORDER — PROPOFOL 500 MG/50ML IV EMUL
INTRAVENOUS | Status: DC | PRN
  Administered 2016-02-22: 140 ug/kg/min via INTRAVENOUS

## 2016-02-22 MED ORDER — ONDANSETRON HCL 4 MG PO TABS: 4.0000 mg | ORAL_TABLET | Freq: Four times a day (QID) | ORAL | Status: DC | PRN

## 2016-02-22 MED ORDER — NITROGLYCERIN 0.4 MG SL SUBL
0.4000 mg | SUBLINGUAL_TABLET | SUBLINGUAL | Status: DC | PRN
Start: 2016-02-22 — End: 2016-02-24

## 2016-02-22 MED ORDER — BUPIVACAINE LIPOSOME 1.3 % IJ SUSP
20.0000 mL | INTRAMUSCULAR | Status: AC
  Administered 2016-02-22: 20 mL
  Filled 2016-02-22: qty 20

## 2016-02-22 MED ORDER — TRANEXAMIC ACID 1000 MG/10ML IV SOLN
2000.0000 mg | INTRAVENOUS | Status: AC
  Administered 2016-02-22: 2000 mg via TOPICAL
  Filled 2016-02-22: qty 20

## 2016-02-22 MED ORDER — LACTATED RINGERS IV SOLN
INTRAVENOUS | Status: DC
  Administered 2016-02-22 (×2): via INTRAVENOUS

## 2016-02-22 MED ORDER — PROPOFOL 10 MG/ML IV BOLUS
INTRAVENOUS | Status: DC | PRN
  Administered 2016-02-22 (×2): 100 mg via INTRAVENOUS
  Administered 2016-02-22: 20 mg via INTRAVENOUS

## 2016-02-22 MED ORDER — BUPIVACAINE-EPINEPHRINE (PF) 0.25% -1:200000 IJ SOLN
INTRAMUSCULAR | Status: AC
  Filled 2016-02-22: qty 30

## 2016-02-22 MED ORDER — PROPOFOL 10 MG/ML IV BOLUS
INTRAVENOUS | Status: AC
  Filled 2016-02-22: qty 40

## 2016-02-22 MED ORDER — CHLORHEXIDINE GLUCONATE 4 % EX LIQD: 60.0000 mL | Freq: Once | CUTANEOUS | Status: DC

## 2016-02-22 MED ORDER — METHOCARBAMOL 500 MG PO TABS
500.0000 mg | ORAL_TABLET | Freq: Four times a day (QID) | ORAL | Status: DC | PRN
  Administered 2016-02-22 – 2016-02-24 (×6): 500 mg via ORAL
  Filled 2016-02-22 (×5): qty 1

## 2016-02-22 MED ORDER — HYDROCODONE-HOMATROPINE 5-1.5 MG/5ML PO SYRP: 5.0000 mL | ORAL_SOLUTION | Freq: Four times a day (QID) | ORAL | Status: DC | PRN

## 2016-02-22 MED ORDER — MENTHOL 3 MG MT LOZG: 1.0000 | LOZENGE | OROMUCOSAL | Status: DC | PRN

## 2016-02-22 MED ORDER — ONDANSETRON HCL 4 MG/2ML IJ SOLN
INTRAMUSCULAR | Status: DC | PRN
Start: 2016-02-22 — End: 2016-02-22
  Administered 2016-02-22: 4 mg via INTRAVENOUS

## 2016-02-22 MED ORDER — METOCLOPRAMIDE HCL 5 MG PO TABS: 5.0000 mg | ORAL_TABLET | Freq: Three times a day (TID) | ORAL | Status: DC | PRN

## 2016-02-22 MED ORDER — METOCLOPRAMIDE HCL 5 MG/ML IJ SOLN: 5.0000 mg | Freq: Three times a day (TID) | INTRAMUSCULAR | Status: DC | PRN

## 2016-02-22 MED ORDER — SODIUM CHLORIDE 0.9 % IR SOLN
Status: DC | PRN
  Administered 2016-02-22: 1000 mL

## 2016-02-22 MED ORDER — LACTATED RINGERS IV SOLN
INTRAVENOUS | Status: DC
  Administered 2016-02-22: 50 mL/h via INTRAVENOUS

## 2016-02-22 MED ORDER — DIPHENHYDRAMINE HCL 12.5 MG/5ML PO ELIX
12.5000 mg | ORAL_SOLUTION | ORAL | Status: DC | PRN
  Administered 2016-02-23: 25 mg via ORAL
  Filled 2016-02-22: qty 10

## 2016-02-22 MED ORDER — HYDROMORPHONE HCL 1 MG/ML IJ SOLN
0.5000 mg | INTRAMUSCULAR | Status: DC | PRN
  Administered 2016-02-23 (×3): 1 mg via INTRAVENOUS
  Filled 2016-02-22 (×3): qty 1

## 2016-02-22 MED ORDER — HYDROCODONE-ACETAMINOPHEN 5-325 MG PO TABS
ORAL_TABLET | ORAL | Status: AC
  Filled 2016-02-22: qty 2

## 2016-02-22 MED ORDER — ACETAMINOPHEN 325 MG PO TABS
650.0000 mg | ORAL_TABLET | Freq: Four times a day (QID) | ORAL | Status: DC | PRN
Start: 2016-02-22 — End: 2016-02-24

## 2016-02-22 MED ORDER — ONDANSETRON HCL 4 MG/2ML IJ SOLN: 4.0000 mg | Freq: Four times a day (QID) | INTRAMUSCULAR | Status: DC | PRN

## 2016-02-22 MED ORDER — ALBUTEROL SULFATE (2.5 MG/3ML) 0.083% IN NEBU: 3.0000 mL | INHALATION_SOLUTION | Freq: Four times a day (QID) | RESPIRATORY_TRACT | Status: DC | PRN

## 2016-02-22 MED ORDER — BISACODYL 5 MG PO TBEC: 5.0000 mg | DELAYED_RELEASE_TABLET | Freq: Every day | ORAL | Status: DC | PRN

## 2016-02-22 MED ORDER — ALUM & MAG HYDROXIDE-SIMETH 200-200-20 MG/5ML PO SUSP: 30.0000 mL | ORAL | Status: DC | PRN

## 2016-02-22 MED ORDER — HYDROMORPHONE HCL 1 MG/ML IJ SOLN
0.2500 mg | INTRAMUSCULAR | Status: DC | PRN
  Administered 2016-02-22 (×4): 0.5 mg via INTRAVENOUS

## 2016-02-22 MED ORDER — DOCUSATE SODIUM 100 MG PO CAPS
100.0000 mg | ORAL_CAPSULE | Freq: Two times a day (BID) | ORAL | Status: DC
  Administered 2016-02-22 – 2016-02-24 (×4): 100 mg via ORAL
  Filled 2016-02-22 (×5): qty 1

## 2016-02-22 MED ORDER — MIDAZOLAM HCL 5 MG/5ML IJ SOLN
INTRAMUSCULAR | Status: DC | PRN
  Administered 2016-02-22 (×2): 1 mg via INTRAVENOUS

## 2016-02-22 MED ORDER — DEXAMETHASONE SODIUM PHOSPHATE 10 MG/ML IJ SOLN
INTRAMUSCULAR | Status: DC | PRN
  Administered 2016-02-22: 10 mg via INTRAVENOUS

## 2016-02-22 MED ORDER — ASPIRIN EC 325 MG PO TBEC
325.0000 mg | DELAYED_RELEASE_TABLET | Freq: Two times a day (BID) | ORAL | Status: DC
  Administered 2016-02-23 – 2016-02-24 (×3): 325 mg via ORAL
  Filled 2016-02-22 (×3): qty 1

## 2016-02-22 SURGICAL SUPPLY — 68 items
BAG DECANTER FOR FLEXI CONT (MISCELLANEOUS) ×2 IMPLANT
BANDAGE ELASTIC 4 VELCRO ST LF (GAUZE/BANDAGES/DRESSINGS) ×3 IMPLANT
BANDAGE ELASTIC 6 VELCRO ST LF (GAUZE/BANDAGES/DRESSINGS) ×2 IMPLANT
BANDAGE ESMARK 6X9 LF (GAUZE/BANDAGES/DRESSINGS) ×1 IMPLANT
BLADE SAGITTAL 25.0X1.19X90 (BLADE) IMPLANT
BLADE SAGITTAL 25.0X1.19X90MM (BLADE)
BLADE SAW SGTL 13.0X1.19X90.0M (BLADE) IMPLANT
BLADE SURG ROTATE 9660 (MISCELLANEOUS) IMPLANT
BNDG CMPR 9X6 STRL LF SNTH (GAUZE/BANDAGES/DRESSINGS) ×1
BNDG CMPR MED 10X6 ELC LF (GAUZE/BANDAGES/DRESSINGS) ×1
BNDG ELASTIC 6X10 VLCR STRL LF (GAUZE/BANDAGES/DRESSINGS) ×3 IMPLANT
BNDG ESMARK 6X9 LF (GAUZE/BANDAGES/DRESSINGS) ×3
BNDG GAUZE ELAST 4 BULKY (GAUZE/BANDAGES/DRESSINGS) ×6 IMPLANT
BOWL SMART MIX CTS (DISPOSABLE) ×3 IMPLANT
CAP KNEE TOTAL 3 SIGMA ×2 IMPLANT
CEMENT HV SMART SET (Cement) ×6 IMPLANT
CLOSURE STERI-STRIP 1/2X4 (GAUZE/BANDAGES/DRESSINGS) ×1
CLOSURE WOUND 1/2 X4 (GAUZE/BANDAGES/DRESSINGS) ×1
CLSR STERI-STRIP ANTIMIC 1/2X4 (GAUZE/BANDAGES/DRESSINGS) ×1 IMPLANT
COVER SURGICAL LIGHT HANDLE (MISCELLANEOUS) ×3 IMPLANT
CUFF TOURNIQUET SINGLE 34IN LL (TOURNIQUET CUFF) ×3 IMPLANT
CUFF TOURNIQUET SINGLE 44IN (TOURNIQUET CUFF) IMPLANT
DECANTER SPIKE VIAL GLASS SM (MISCELLANEOUS) ×1 IMPLANT
DRAPE EXTREMITY T 121X128X90 (DRAPE) ×3 IMPLANT
DRAPE PROXIMA HALF (DRAPES) ×3 IMPLANT
DRAPE U-SHAPE 47X51 STRL (DRAPES) ×3 IMPLANT
DRSG ADAPTIC 3X8 NADH LF (GAUZE/BANDAGES/DRESSINGS) ×3 IMPLANT
DRSG PAD ABDOMINAL 8X10 ST (GAUZE/BANDAGES/DRESSINGS) ×3 IMPLANT
DURAPREP 26ML APPLICATOR (WOUND CARE) ×5 IMPLANT
ELECT REM PT RETURN 9FT ADLT (ELECTROSURGICAL) ×3
ELECTRODE REM PT RTRN 9FT ADLT (ELECTROSURGICAL) ×1 IMPLANT
GAUZE SPONGE 4X4 12PLY STRL (GAUZE/BANDAGES/DRESSINGS) ×3 IMPLANT
GLOVE BIO SURGEON STRL SZ8 (GLOVE) ×6 IMPLANT
GLOVE BIOGEL PI IND STRL 8 (GLOVE) ×2 IMPLANT
GLOVE BIOGEL PI INDICATOR 8 (GLOVE) ×4
GOWN STRL REUS W/ TWL LRG LVL3 (GOWN DISPOSABLE) ×1 IMPLANT
GOWN STRL REUS W/ TWL XL LVL3 (GOWN DISPOSABLE) ×2 IMPLANT
GOWN STRL REUS W/TWL LRG LVL3 (GOWN DISPOSABLE) ×3
GOWN STRL REUS W/TWL XL LVL3 (GOWN DISPOSABLE) ×6
HANDPIECE INTERPULSE COAX TIP (DISPOSABLE) ×3
HOOD PEEL AWAY FACE SHEILD DIS (HOOD) ×6 IMPLANT
IMMOBILIZER KNEE 22 UNIV (SOFTGOODS) ×3 IMPLANT
KIT BASIN OR (CUSTOM PROCEDURE TRAY) ×3 IMPLANT
KIT ROOM TURNOVER OR (KITS) ×3 IMPLANT
MANIFOLD NEPTUNE II (INSTRUMENTS) ×3 IMPLANT
NDL HYPO 21X1 ECLIPSE (NEEDLE) ×1 IMPLANT
NEEDLE HYPO 21X1 ECLIPSE (NEEDLE) ×3 IMPLANT
NS IRRIG 1000ML POUR BTL (IV SOLUTION) ×3 IMPLANT
PACK TOTAL JOINT (CUSTOM PROCEDURE TRAY) ×3 IMPLANT
PACK UNIVERSAL I (CUSTOM PROCEDURE TRAY) ×3 IMPLANT
PAD ARMBOARD 7.5X6 YLW CONV (MISCELLANEOUS) ×6 IMPLANT
SET HNDPC FAN SPRY TIP SCT (DISPOSABLE) ×1 IMPLANT
SPONGE GAUZE 4X4 12PLY STER LF (GAUZE/BANDAGES/DRESSINGS) ×2 IMPLANT
STAPLER VISISTAT 35W (STAPLE) IMPLANT
STRIP CLOSURE SKIN 1/2X4 (GAUZE/BANDAGES/DRESSINGS) ×2 IMPLANT
SUCTION FRAZIER HANDLE 10FR (MISCELLANEOUS)
SUCTION TUBE FRAZIER 10FR DISP (MISCELLANEOUS) IMPLANT
SUT MNCRL AB 3-0 PS2 18 (SUTURE) ×2 IMPLANT
SUT VIC AB 0 CT1 27 (SUTURE) ×6
SUT VIC AB 0 CT1 27XBRD ANBCTR (SUTURE) ×2 IMPLANT
SUT VIC AB 2-0 CT1 27 (SUTURE) ×6
SUT VIC AB 2-0 CT1 TAPERPNT 27 (SUTURE) ×2 IMPLANT
SUT VLOC 180 0 24IN GS25 (SUTURE) ×3 IMPLANT
SYR 50ML LL SCALE MARK (SYRINGE) ×3 IMPLANT
TOWEL OR 17X24 6PK STRL BLUE (TOWEL DISPOSABLE) ×3 IMPLANT
TOWEL OR 17X26 10 PK STRL BLUE (TOWEL DISPOSABLE) ×3 IMPLANT
TRAY FOLEY CATH 14FR (SET/KITS/TRAYS/PACK) IMPLANT
WATER STERILE IRR 1000ML POUR (IV SOLUTION) ×2 IMPLANT

## 2016-02-22 NOTE — Progress Notes (Signed)
Pt transferred from pacu this evening at 1845hrs . Alert, oriented and able to voice needs. No complaints expressed at this time

## 2016-02-22 NOTE — Anesthesia Procedure Notes (Signed)
Procedure Name: LMA Insertion Date/Time: 02/22/2016 11:09 AM Performed by: Danley Danker L Patient Re-evaluated:Patient Re-evaluated prior to inductionOxygen Delivery Method: Circle system utilized Preoxygenation: Pre-oxygenation with 100% oxygen Intubation Type: IV induction LMA: LMA inserted LMA Size: 5.0 Number of attempts: 1 Placement Confirmation: positive ETCO2 and breath sounds checked- equal and bilateral Tube secured with: Tape Dental Injury: Teeth and Oropharynx as per pre-operative assessment

## 2016-02-22 NOTE — Progress Notes (Signed)
Orthopedic Tech Progress Note Patient Details:  Dennis Hill 1946-07-07 [id number] Ortho visit patient off cpm at 2220 Patient ID: Dennis Hill, male   DOB: 08-20-1946, 70 y.o.   MRN: [id number]   [name] 02/22/2016, 10:24 PM

## 2016-02-22 NOTE — Transfer of Care (Signed)
Immediate Anesthesia Transfer of Care Note  Patient: Dennis Hill  Procedure(s) Performed: Procedure(s): TOTAL KNEE ARTHROPLASTY (Left)  Patient Location: PACU  Anesthesia Type:General  Level of Consciousness: awake  Airway & Oxygen Therapy: Patient Spontanous Breathing and Patient connected to face mask oxygen  Post-op Assessment: Report given to RN and Post -op Vital signs reviewed and stable  Post vital signs: Reviewed and stable  Last Vitals:  Filed Vitals:   02/22/16 0913 02/22/16 1312  BP: 156/86   Pulse: 79   Temp: 36.6 C 36.4 C  Resp: 20     Last Pain:  Filed Vitals:   02/22/16 1313  PainSc: 7          Complications: No apparent anesthesia complications

## 2016-02-22 NOTE — Interval H&P Note (Signed)
History and Physical Interval Note:  02/22/2016 9:36 AM  Dennis Hill  has presented today for surgery, with the diagnosis of LEFT KNEE DEGENERATIVE JOINT DISEASE  The various methods of treatment have been discussed with the patient and family. After consideration of risks, benefits and other options for treatment, the patient has consented to  Procedure(s): TOTAL KNEE ARTHROPLASTY (Left) as a surgical intervention .  The patient's history has been reviewed, patient examined, no change in status, stable for surgery.  I have reviewed the patient's chart and labs.  Questions were answered to the patient's satisfaction.     Paulo Keimig G

## 2016-02-22 NOTE — Progress Notes (Signed)
Orthopedic Tech Progress Note Patient Details:  Dennis Hill 1946/05/27 [id number]  CPM Left Knee CPM Left Knee: On Left Knee Flexion (Degrees): 90 Left Knee Extension (Degrees): 0 Additional Comments: trapeze bar patient helper Viewed order from doctor's order list  Hildred Priest 02/22/2016, 1:55 PM

## 2016-02-22 NOTE — Anesthesia Preprocedure Evaluation (Signed)
Anesthesia Evaluation  Patient identified by MRN, date of birth, ID band Patient awake    Reviewed: Allergy & Precautions, NPO status , Patient's Chart, lab work & pertinent test results  Airway Mallampati: I  TM Distance: >3 FB Neck ROM: Full    Dental   Pulmonary sleep apnea , former smoker,    Pulmonary exam normal        Cardiovascular + CAD  Normal cardiovascular exam+ dysrhythmias Atrial Fibrillation      Neuro/Psych Anxiety Depression    GI/Hepatic GERD  Medicated and Controlled,  Endo/Other  diabetes, Type 2, Oral Hypoglycemic Agents  Renal/GU      Musculoskeletal   Abdominal   Peds  Hematology   Anesthesia Other Findings   Reproductive/Obstetrics                             Anesthesia Physical Anesthesia Plan  ASA: II  Anesthesia Plan: Spinal   Post-op Pain Management:    Induction: Intravenous  Airway Management Planned: Simple Face Mask  Additional Equipment:   Intra-op Plan:   Post-operative Plan:   Informed Consent: I have reviewed the patients History and Physical, chart, labs and discussed the procedure including the risks, benefits and alternatives for the proposed anesthesia with the patient or authorized representative who has indicated his/her understanding and acceptance.     Plan Discussed with: CRNA and Surgeon  Anesthesia Plan Comments:         Anesthesia Quick Evaluation

## 2016-02-22 NOTE — Anesthesia Postprocedure Evaluation (Signed)
Anesthesia Post Note  Patient: Dennis Hill  Procedure(s) Performed: Procedure(s) (LRB): TOTAL KNEE ARTHROPLASTY (Left)  Patient location during evaluation: PACU Anesthesia Type: Spinal Level of consciousness: oriented and awake and alert Pain management: pain level controlled Vital Signs Assessment: post-procedure vital signs reviewed and stable Respiratory status: spontaneous breathing, respiratory function stable and patient connected to nasal cannula oxygen Cardiovascular status: blood pressure returned to baseline and stable Postop Assessment: no headache and no backache Anesthetic complications: no    Last Vitals:  Filed Vitals:   02/22/16 1427 02/22/16 1430  BP: 146/73   Pulse: 64 71  Temp:    Resp:  10    Last Pain:  Filed Vitals:   02/22/16 1432  PainSc: 9                  Layani Foronda DAVID

## 2016-02-22 NOTE — Op Note (Signed)
PREOP DIAGNOSIS: DJD LEFT KNEE POSTOP DIAGNOSIS:  same PROCEDURE: LEFT TKR ANESTHESIA: Spinal and general ATTENDING SURGEON: Ruddy Swire G ASSISTANT: Loni Dolly PA  INDICATIONS FOR PROCEDURE: Dennis Hill is a 70 y.o. male who has struggled for a long time with pain due to degenerative arthritis of the left knee.  The patient has failed many conservative non-operative measures and at this point has pain which limits the ability to sleep and walk.  The patient is offered total knee replacement.  Informed operative consent was obtained after discussion of possible risks of anesthesia, infection, neurovascular injury, DVT, and death.  The importance of the post-operative rehabilitation protocol to optimize result was stressed extensively with the patient.  SUMMARY OF FINDINGS AND PROCEDURE:  Dennis Hill was taken to the operative suite where under the above anesthesia a left knee replacement was performed.  There were advanced degenerative changes and the bone quality was excellent.  We used the DePuyLCS system and placed size large plus femur, 7 tibia, 41 mm all polyethylene patella, and a size 10 mm spacer.  Loni Dolly PA-C assisted throughout and was invaluable to the completion of the case in that he helped retract and maintain exposure while I placed the components.  He also helped close thereby minimizing OR time.  The patient was admitted for appropriate post-op care to include perioperative antibiotics and mechanical and pharmacologic measures for DVT prophylaxis.  DESCRIPTION OF PROCEDURE:  Dennis Hill was taken to the operative suite where the above anesthesia was applied.  The patient was positioned supine and prepped and draped in normal sterile fashion.  An appropriate time out was performed.  After the administration of kefzol pre-op antibiotic the leg was elevated and exsanguinated and a tourniquet inflated.  A standard longitudinal incision was made on the  anterior knee.  Dissection was carried down to the extensor mechanism.  All appropriate anti-infective measures were used including the pre-operative antibiotic, betadine impregnated drape, and closed hooded exhaust systems for each member of the surgical team.  A medial parapatellar incision was made in the extensor mechanism and the knee cap flipped and the knee flexed.  Some residual meniscal tissues were removed along with any remaining ACL/PCL tissue.  A guide was placed on the tibia and a flat cut was made on it's superior surface.  An intramedullary guide was placed in the femur and was utilized to make anterior and posterior cuts creating an appropriate flexion gap.  A second intramedullary guide was placed in the femur to make a distal cut properly balancing the knee with an extension gap equal to the flexion gap.  The three bones sized to the above mentioned sizes and the appropriate guides were placed and utilized.  A trial reduction was done and the knee easily came to full extension and the patella tracked well on flexion.  The trial components were removed and all bones were cleaned with pulsatile lavage and then dried thoroughly.  Cement was mixed and was pressurized onto the bones followed by placement of the aforementioned components.  Excess cement was trimmed and pressure was held on the components until the cement had hardened.  The tourniquet was deflated and a small amount of bleeding was controlled with cautery and pressure.  The knee was irrigated thoroughly.  The extensor mechanism was re-approximated with V-loc suture in running fashion.  The knee was flexed and the repair was solid.  The subcutaneous tissues were re-approximated with #0 and #2-0 vicryl and the skin  closed with a subcuticular stitch and steristrips.  A sterile dressing was applied.  Intraoperative fluids, EBL, and tourniquet time can be obtained from anesthesia records.  DISPOSITION:  The patient was taken to recovery  room in stable condition and admitted for appropriate post-op care to include peri-operative antibiotic and DVT prophylaxis with mechanical and pharmacologic measures.  Alveda Vanhorne G 02/22/2016, 12:42 PM

## 2016-02-23 ENCOUNTER — Encounter (HOSPITAL_COMMUNITY): Payer: Self-pay | Admitting: Orthopaedic Surgery

## 2016-02-23 NOTE — Clinical Social Work Note (Signed)
Patient has been offered a bed at [facility].  [facility] was made aware, and will meet with patient tomorrow to complete paperwork.  Patient has a bed available for tomorrow.  CSW to continue to follow patient's progress throughout discharge planning.  Jones Broom. [place], MSW, [place] 02/23/2016 5:38 PM

## 2016-02-23 NOTE — Progress Notes (Signed)
Subjective: 1 Day Post-Op Procedure(s) (LRB): TOTAL KNEE ARTHROPLASTY (Left)    Activity level:  wbat Diet tolerance:  ok Voiding:  ok Patient reports pain as mild.    Objective: Vital signs in last 24 hours: Temp:  [97.5 F (36.4 C)-99.1 F (37.3 C)] 99 F (37.2 C) (06/07 0611) Pulse Rate:  [43-81] 76 (06/07 0611) Resp:  [5-26] 16 (06/07 0611) BP: (107-156)/(51-96) 108/51 mmHg (06/07 0611) SpO2:  [93 %-100 %] 93 % (06/07 0611) Weight:  [113.399 kg (250 lb)] 113.399 kg (250 lb) (06/06 0913)  Labs: No results for input(s): HGB in the last 72 hours. No results for input(s): WBC, RBC, HCT, PLT in the last 72 hours. No results for input(s): NA, K, CL, CO2, BUN, CREATININE, GLUCOSE, CALCIUM in the last 72 hours. No results for input(s): LABPT, INR in the last 72 hours.  Physical Exam:  Neurologically intact ABD soft Neurovascular intact Sensation intact distally Intact pulses distally Dorsiflexion/Plantar flexion intact Incision: dressing C/D/I and no drainage No cellulitis present Compartment soft  Assessment/Plan:  1 Day Post-Op Procedure(s) (LRB): TOTAL KNEE ARTHROPLASTY (Left) Advance diet Up with therapy D/C IV fluids Plan for discharge tomorrow Discharge to [facility] if doing well and cleared by PT Continue on ASA 325mg  BID for 2 weeks.  Follow up in office 2 weeks. I will change dressing to aquacel tomorrow.   Dennis Hill, Dennis Hill 02/23/2016, 7:41 AM

## 2016-02-23 NOTE — NC FL2 (Signed)
[place] LEVEL OF CARE SCREENING TOOL     IDENTIFICATION  Patient Name: Dennis Hill Birthdate: 12/02/1945 Sex: male Admission Date (Current Location): 02/22/2016  [facility] [place] Number:  Herbalist and Address:   [facility] [address], [place], [address]      Provider Number: [id number]  Attending Physician Name and Address:  Melrose Nakayama, MD  Relative Name and Phone Number:  Rodena Piety [phone] or Arnette Norris Daughter [phone]    Current Level of Care: Hospital Recommended Level of Care: [place] Prior Approval Number:    Date Approved/Denied:   PASRR Number: [id number]  Discharge Plan: SNF    Current Diagnoses: Patient Active Problem List   Diagnosis Date Noted  . Primary osteoarthritis of left knee 02/22/2016  . Cough 02/10/2016  . Acute gout 01/11/2016  . Acute sinus infection 07/15/2015  . Pronation deformity of both feet 11/24/2014  . Wheezing 05/30/2014  . Thyroid mass 05/30/2014  . Chest pain 05/29/2014  . Encounter for preventative adult health care exam with abnormal findings 01/09/2014  . Recurrent epistaxis 10/15/2013  . Primary localized osteoarthrosis, lower leg 09/15/2013  . Angioedema 08/20/2013  . Venous insufficiency 08/20/2013  . Chronic meniscal tear of knee 08/12/2013  . Osteoarthritis of left knee 08/12/2013  . Kidney stones 03/21/2013  . BPH (benign prostatic hypertrophy) 03/21/2013  . Gross hematuria 02/21/2013  . Allergic rhinitis 02/21/2013  . Cardiomyopathy ([place]) 02/04/2013  . Obstructive sleep apnea 02/04/2013  . Atrial flutter ([place]) 12/11/2012  . Left knee pain 12/11/2012  . Left sided sciatica 12/11/2012  . Raynaud phenomenon 12/11/2012  . Increased prostate specific antigen (PSA) velocity 12/11/2012  . Hyperlipidemia 02/10/2011  . Hypersomnia 12/13/2010  . Dysphagia 12/13/2010  . Right flank pain 12/13/2010  . FOOT PAIN, LEFT  04/05/2010  . Diabetes ([place]) 01/28/2010  . 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 RUQ PAIN 01/28/2010  . NEPHROLITHIASIS, HX OF 01/28/2010  . GERD 03/15/2009  . DIVERTICULOSIS, COLON 03/15/2009  . ABDOMINAL PAIN OTHER SPECIFIED SITE 03/15/2009  . COLONIC POLYPS, HX OF 03/15/2009    Orientation RESPIRATION BLADDER Height & Weight     Self, Time, Situation, Place  Normal Continent Weight: 250 lb (113.399 kg) Height:     BEHAVIORAL SYMPTOMS/MOOD NEUROLOGICAL BOWEL NUTRITION STATUS      Continent Diet (Regular diet)  AMBULATORY STATUS COMMUNICATION OF NEEDS Skin   Limited Assist Verbally Surgical wounds                       Personal Care Assistance Level of Assistance  Bathing, Dressing Bathing Assistance: Limited assistance   Dressing Assistance: Limited assistance     Functional Limitations Info  Sight, Hearing, Speech Sight Info: Adequate Hearing Info: Adequate Speech Info: Adequate    SPECIAL CARE FACTORS FREQUENCY  PT (By licensed PT)     PT Frequency: 5x a week              Contractures      Additional Factors Info  Allergies, Code Status Code Status Info: Full Code Allergies Info: LISINOPRIL, XARELTO, LIPITOR           Current Medications (02/23/2016):  This is the current hospital active medication list Current Facility-Administered Medications  Medication Dose Route Frequency Provider Last Rate Last Dose  . acetaminophen (TYLENOL) tablet 650 mg  650 mg Oral Q6H PRN Loni Dolly, PA-C       Or  . acetaminophen (  TYLENOL) suppository 650 mg  650 mg Rectal Q6H PRN Loni Dolly, PA-C      . albuterol (PROVENTIL) (2.5 MG/3ML) 0.083% nebulizer solution 3 mL  3 mL Inhalation Q6H PRN Loni Dolly, PA-C      . alum & mag hydroxide-simeth (MAALOX/MYLANTA) 200-200-20 MG/5ML suspension 30 mL  30 mL Oral Q4H PRN Loni Dolly, PA-C      . aspirin EC tablet 325 mg  325 mg Oral BID PC Andrew Nida, PA-C   325 mg at 02/23/16 1056  . bisacodyl (DULCOLAX) EC  tablet 5 mg  5 mg Oral Daily PRN Loni Dolly, PA-C      . diphenhydrAMINE (BENADRYL) 12.5 MG/5ML elixir 12.5-25 mg  12.5-25 mg Oral Q4H PRN Loni Dolly, PA-C      . docusate sodium (COLACE) capsule 100 mg  100 mg Oral BID Loni Dolly, PA-C   100 mg at 02/23/16 1056  . HYDROcodone-acetaminophen (NORCO/VICODIN) 5-325 MG per tablet 1-2 tablet  1-2 tablet Oral Q4H PRN Loni Dolly, PA-C   2 tablet at 02/23/16 [id number]  . HYDROcodone-homatropine (HYCODAN) 5-1.5 MG/5ML syrup 5 mL  5 mL Oral Q6H PRN Loni Dolly, PA-C      . HYDROmorphone (DILAUDID) injection 0.5-1 mg  0.5-1 mg Intravenous Q3H PRN Loni Dolly, PA-C   1 mg at 02/23/16 1057  . menthol-cetylpyridinium (CEPACOL) lozenge 3 mg  1 lozenge Oral PRN Loni Dolly, PA-C       Or  . phenol (CHLORASEPTIC) mouth spray 1 spray  1 spray Mouth/Throat PRN Loni Dolly, PA-C      . methocarbamol (ROBAXIN) tablet 500 mg  500 mg Oral Q6H PRN Loni Dolly, PA-C   500 mg at 02/23/16 1057   Or  . methocarbamol (ROBAXIN) 500 mg in dextrose 5 % 50 mL IVPB  500 mg Intravenous Q6H PRN Loni Dolly, PA-C      . metoCLOPramide (REGLAN) tablet 5-10 mg  5-10 mg Oral Q8H PRN Loni Dolly, PA-C       Or  . metoCLOPramide (REGLAN) injection 5-10 mg  5-10 mg Intravenous Q8H PRN Loni Dolly, PA-C      . nitroGLYCERIN (NITROSTAT) SL tablet 0.4 mg  0.4 mg Sublingual Q5 min PRN Loni Dolly, PA-C      . ondansetron [facility]) tablet 4 mg  4 mg Oral Q6H PRN Loni Dolly, PA-C       Or  . ondansetron [facility]) injection 4 mg  4 mg Intravenous Q6H PRN Loni Dolly, PA-C      . pantoprazole (PROTONIX) EC tablet 80 mg  80 mg Oral Q1200 Loni Dolly, PA-C      . tamsulosin (FLOMAX) capsule 0.4 mg  0.4 mg Oral Daily Loni Dolly, PA-C   0.4 mg at 02/23/16 1056     Discharge Medications: Please see discharge summary for a list of discharge medications.  Relevant Imaging Results:  Relevant Lab Results:   Additional Information SSN [id number]  Ross Ludwig, [place]

## 2016-02-23 NOTE — Clinical Social Work Note (Signed)
Clinical Social Work Assessment  Patient Details  Name: Dennis Hill MRN: [id number] Date of Birth: [birth year]  Date of referral:  02/23/16               Reason for consult:  Facility Placement                Permission sought to share information with:  Facility Sport and exercise psychologist Permission granted to share information::  Yes, Verbal Permission Granted  Name::     Arnette Norris Daughter [phone] or Rodena Piety [phone]  Agency::  SNF admissions  Relationship::     Contact Information:     Housing/Transportation Living arrangements for the past 2 months:  Single Family Home Source of Information:  Patient Patient Interpreter Needed:  None Criminal Activity/Legal Involvement Pertinent to Current Situation/Hospitalization:  No - Comment as needed Significant Relationships:  Adult Children, Other(Comment) Lives with:  Significant Other Do you feel safe going back to the place where you live?  No (Patient needs some short term rehab before he is able to return back home.) Need for family participation in patient care:  No (Coment)  Care giving concerns:  Patient feels he needs short term rehab before he can return back home.   Social Worker assessment / plan:  Patient is a 70 year old male who lives in a house.  Patient is alert and oriented x4 and able to express his feelings.  Patient states he has not been to SNF for rehab, CSW explained to patient what to expect and how insurance will pay for his stay.  Patient was explained the process for bed searching and giving him offers.  Patient states he is looking forward to getting his therapy and then returning back home as soon as he can.  Patient stated he did not have any other questions or concerns.  Employment status:  Health visitor PT Recommendations:  [place] / Referral to community resources:  [place]  Patient/Family's Response to care:  Patient in agreement to going to SNF for short term rehab.  Patient/Family's Understanding of and Emotional Response to Diagnosis, Current Treatment, and Prognosis:  Patient aware of current treatment plan and prognosis.  Emotional Assessment Appearance:  Appears stated age Attitude/Demeanor/Rapport:    Affect (typically observed):  Appropriate, Calm, Stable, Pleasant Orientation:  Oriented to Self, Oriented to Place, Oriented to  Time, Oriented to Situation Alcohol / Substance use:  Not Applicable Psych involvement (Current and /or in the community):  No (Comment)  Discharge Needs  Concerns to be addressed:  Lack of Support Readmission within the last 30 days:  No Current discharge risk:  None Barriers to Discharge:  No Barriers Identified   Ross Ludwig, LCSWA 02/23/2016, 5:41 PM

## 2016-02-23 NOTE — Progress Notes (Signed)
OT Cancellation Note  Patient Details Name: Dennis Hill MRN: [id number] DOB: 27-Nov-1945   Cancelled Treatment:    Reason Eval/Treat Not Completed: Other (comment). Defer OT eval and follow up to SNF. Pt will d/c to [facility]  [name] 02/23/2016, 2:43 PM

## 2016-02-23 NOTE — Progress Notes (Signed)
Orthopedic Tech Progress Note Patient Details:  Dennis Hill 06/14/1946 [id number] Ortho visit put on cpm at 1935 Patient ID: [name], male   DOB: 1946/04/05, 70 y.o.   MRN: [id number]   [name] 02/23/2016, 7:39 PM

## 2016-02-23 NOTE — Evaluation (Addendum)
Physical Therapy Evaluation Patient Details Name: Dennis Hill MRN: [id number] DOB: Sep 30, 1945 Today's Date: 02/23/2016   History of Present Illness  69 y/o WM s/p L TKA (02/22/16) due to arthritis  Clinical Impression  Pt admitted with above diagnosis. Pt currently with functional limitations due to the deficits listed below (see PT Problem List).  Pt will benefit from skilled PT to increase their independence and safety with mobility to allow discharge to the venue listed below.  Pt did become lightheaded after taking a few steps and needed MOD A to get to chair.  Due to pt's height and general size, recommend recliner follow for next session in case he feels poorly again.  Recommend SNF for rehab.     Follow Up Recommendations Supervision for mobility/OOB    Equipment Recommendations  Other (comment) (To be determined in next venue of care)    Recommendations for Other Services       Precautions / Restrictions Precautions Precautions: Knee Precaution Booklet Issued: No Restrictions Weight Bearing Restrictions: Yes LLE Weight Bearing: Weight bearing as tolerated      Mobility  Bed Mobility Overal bed mobility: Needs Assistance Bed Mobility: Supine to Sit     Supine to sit: Min assist     General bed mobility comments: A for L LE to come to EOB and A to it to ground, but able to get trunk upright  Transfers Overall transfer level: Needs assistance Equipment used: Rolling walker (2 wheeled) Transfers: Sit to/from Stand Sit to Stand: Min assist         General transfer comment: MIN A to power up and cues for hand placement  Ambulation/Gait Ambulation/Gait assistance: Min assist;Mod assist Ambulation Distance (Feet): 5 Feet Assistive device: Rolling walker (2 wheeled) Gait Pattern/deviations: Step-to pattern;Antalgic     General Gait Details: Pt ambulated ~ 3 feet and then began feeling poorly and turned and walked back towards recliner to sit.  He had to  reach fro recliner and be assisted with MOD A due to feeling lightheaded.  Legs elevated, head back, with cold washcloth and he began feeling better within 90 seconds.  Nursing notified.   Stairs            Wheelchair Mobility    Modified Rankin (Stroke Patients Only)       Balance Overall balance assessment: Needs assistance   Sitting balance-Leahy Scale: Good       Standing balance-Leahy Scale: Poor Standing balance comment: requires UE support                             Pertinent Vitals/Pain Pain Assessment: 0-10 Pain Score: 4  Pain Location: L knee Pain Descriptors / Indicators: Aching Pain Intervention(s): Premedicated before session;Repositioned;Ice applied    Home Living Family/patient expects to be discharged to:: Skilled nursing facility ([name]) Living Arrangements:  (step-grandchildren)                    Prior Function Level of Independence: Independent               Hand Dominance   Dominant Hand: Right    Extremity/Trunk Assessment   Upper Extremity Assessment: Defer to OT evaluation           Lower Extremity Assessment: LLE deficits/detail   LLE Deficits / Details: fair- quad set.  AROM ~ 75 degrees with grimacing, but good effort  Cervical / Trunk Assessment: Normal  Communication  Communication: No difficulties  Cognition Arousal/Alertness: Awake/alert Behavior During Therapy: WFL for tasks assessed/performed Overall Cognitive Status: Within Functional Limits for tasks assessed                      General Comments General comments (skin integrity, edema, etc.): Daughter present.  Chair brought to end of recliner for increased support and facilitate extension due to pt's height.    Exercises Total Joint Exercises Ankle Circles/Pumps: AROM;Both;15 reps;Supine Quad Sets: Both;10 reps;Supine Towel Squeeze: Strengthening;Both;10 reps;Supine Short Arc Quad: AAROM;Left;5 reps Heel Slides:  AROM;Left;10 reps;Supine Straight Leg Raises: AAROM;Left;5 reps;Supine Goniometric ROM: grossly 10-70 degrees with bulky dressing intact      Assessment/Plan    PT Assessment Patient needs continued PT services  PT Diagnosis Difficulty walking;Acute pain   PT Problem List Decreased strength;Decreased range of motion;Decreased activity tolerance;Decreased balance;Decreased mobility;Decreased knowledge of use of DME;Pain  PT Treatment Interventions DME instruction;Gait training;Functional mobility training;Therapeutic activities;Therapeutic exercise;Balance training;Patient/family education   PT Goals (Current goals can be found in the Care Plan section) Acute Rehab PT Goals Patient Stated Goal: go to [facility] PT Goal Formulation: With patient/family Time For Goal Achievement: 02/28/16 Potential to Achieve Goals: Good    Frequency 7X/week   Barriers to discharge Decreased caregiver support      Co-evaluation               End of Session Equipment Utilized During Treatment: Gait belt Activity Tolerance: Patient limited by fatigue;Treatment limited secondary to medical complications (Comment) (began feeling lightheaded) Patient left: in chair;with call bell/phone within reach;with family/visitor present Nurse Communication: Mobility status;Other (comment) (lightheadedness)         Time: [id number] PT Time Calculation (min) (ACUTE ONLY): 49 min   Charges:   PT Evaluation $PT Eval Moderate Complexity: 1 Procedure PT Treatments $Therapeutic Exercise: 8-22 mins $Therapeutic Activity: 8-22 mins   PT G Codes:        Raylyn Carton LUBECK 02/23/2016, 10:59 AM

## 2016-02-23 NOTE — Progress Notes (Addendum)
PT NOTE: Pt doing well with ROM and strengthening.  Issued handout and pt is motivated.  Con't to recommend SNF.  02/23/16 1400  PT Visit Information  Last PT Received On 02/23/16  Assistance Needed +2  History of Present Illness 70 y/o WM s/p L TKA (02/22/16) due to arthritis  Subjective Data  Patient Stated Goal go to [facility] for rehab  Precautions  Precautions Knee  Precaution Booklet Issued Yes (comment)  Precaution Comments reviewed with pt  Restrictions  LLE Weight Bearing WBAT  Pain Assessment  Pain Assessment Faces  Faces Pain Scale 4  Pain Location L knee with therex  Pain Intervention(s) Monitored during session;Limited activity within patient's tolerance  Cognition  Arousal/Alertness Awake/alert  Behavior During Therapy WFL for tasks assessed/performed  Overall Cognitive Status Within Functional Limits for tasks assessed  General Comments  General comments (skin integrity, edema, etc.) Daughter present  Total Joint Exercises  Ankle Circles/Pumps AROM;Both;15 reps;Supine  Quad Sets Both;10 reps;Supine  Towel Squeeze Strengthening;Both;10 reps;Supine  Heel Slides AROM;Left;10 reps;Supine;AAROM (used gait belt to A him)  Straight Leg Raises AAROM;Left;5 reps;Supine  Hip ABduction/ADduction AAROM;Left;10 reps;Supine  PT - End of Session  Activity Tolerance Patient tolerated treatment well  Patient left in bed;with call bell/phone within reach;with family/visitor present  PT - Assessment/Plan  PT Plan Current plan remains appropriate  PT Frequency (ACUTE ONLY) 7X/week  Follow Up Recommendations SNF;Supervision for mobility/OOB  PT equipment Other (comment) (TBD)  PT Goal Progression  Progress towards PT goals Progressing toward goals  Acute Rehab PT Goals  PT Goal Formulation With patient/family  Time For Goal Achievement 02/28/16  Potential to Achieve Goals Good  PT Time Calculation  PT Start Time (ACUTE ONLY) 1357  PT Stop Time (ACUTE ONLY) 1416  PT Time  Calculation (min) (ACUTE ONLY) 19 min  PT General Charges  $$ ACUTE PT VISIT 1 Procedure  PT Treatments  $Therapeutic Exercise 8-22 mins   Shahana Capes LUBECK  Ambre Kobayashi L. Tamala Julian, [place] Pager [phone] 02/23/2016

## 2016-02-23 NOTE — Clinical Social Work Placement (Signed)
   CLINICAL SOCIAL WORK PLACEMENT  NOTE  Date:  02/23/2016  Patient Details  Name: Dennis Hill MRN: [id number] Date of Birth: 1945-10-06  Clinical Social Work is seeking post-discharge placement for this patient at the [place] level of care (*CSW will initial, date and re-position this form in  chart as items are completed):  Yes   Patient/family provided with [place] Work Department's list of facilities offering this level of care within the geographic area requested by the patient (or if unable, by the patient's family).  Yes   Patient/family informed of their freedom to choose among providers that offer the needed level of care, that participate in Medicare, Medicaid or managed care program needed by the patient, have an available bed and are willing to accept the patient.  Yes   Patient/family informed of [facility]'s ownership interest in [facility] and [facility], as well as of the fact that they are under no obligation to receive care at these facilities.  PASRR submitted to EDS on 02/23/16     PASRR number received on 02/23/16     Existing PASRR number confirmed on       FL2 transmitted to all facilities in geographic area requested by pt/family on 02/23/16     FL2 transmitted to all facilities within larger geographic area on       Patient informed that his/her managed care company has contracts with or will negotiate with certain facilities, including the following:        Yes   Patient/family informed of bed offers received.  Patient chooses bed at [facility]     Physician recommends and patient chooses bed at      Patient to be transferred to [facility] on  .  Patient to be transferred to facility by       Patient family notified on   of transfer.  Name of family member notified:        PHYSICIAN Please sign FL2     Additional Comment:     _______________________________________________ Ross Ludwig, LCSWA 02/23/2016, 5:46 PM

## 2016-02-24 DIAGNOSIS — Z471 Aftercare following joint replacement surgery: Secondary | ICD-10-CM | POA: Diagnosis not present

## 2016-02-24 DIAGNOSIS — N289 Disorder of kidney and ureter, unspecified: Secondary | ICD-10-CM | POA: Diagnosis not present

## 2016-02-24 DIAGNOSIS — M6281 Muscle weakness (generalized): Secondary | ICD-10-CM | POA: Diagnosis not present

## 2016-02-24 DIAGNOSIS — Z96652 Presence of left artificial knee joint: Secondary | ICD-10-CM | POA: Diagnosis not present

## 2016-02-24 DIAGNOSIS — D508 Other iron deficiency anemias: Secondary | ICD-10-CM | POA: Diagnosis not present

## 2016-02-24 DIAGNOSIS — E079 Disorder of thyroid, unspecified: Secondary | ICD-10-CM | POA: Diagnosis not present

## 2016-02-24 DIAGNOSIS — J309 Allergic rhinitis, unspecified: Secondary | ICD-10-CM | POA: Diagnosis not present

## 2016-02-24 DIAGNOSIS — R2681 Unsteadiness on feet: Secondary | ICD-10-CM | POA: Diagnosis not present

## 2016-02-24 DIAGNOSIS — K219 Gastro-esophageal reflux disease without esophagitis: Secondary | ICD-10-CM | POA: Diagnosis not present

## 2016-02-24 DIAGNOSIS — R262 Difficulty in walking, not elsewhere classified: Secondary | ICD-10-CM | POA: Diagnosis not present

## 2016-02-24 DIAGNOSIS — N400 Enlarged prostate without lower urinary tract symptoms: Secondary | ICD-10-CM | POA: Diagnosis not present

## 2016-02-24 DIAGNOSIS — M109 Gout, unspecified: Secondary | ICD-10-CM | POA: Diagnosis not present

## 2016-02-24 DIAGNOSIS — D72829 Elevated white blood cell count, unspecified: Secondary | ICD-10-CM | POA: Diagnosis not present

## 2016-02-24 DIAGNOSIS — R040 Epistaxis: Secondary | ICD-10-CM | POA: Diagnosis not present

## 2016-02-24 DIAGNOSIS — M1712 Unilateral primary osteoarthritis, left knee: Secondary | ICD-10-CM | POA: Diagnosis not present

## 2016-02-24 DIAGNOSIS — Z139 Encounter for screening, unspecified: Secondary | ICD-10-CM | POA: Diagnosis not present

## 2016-02-24 DIAGNOSIS — K5901 Slow transit constipation: Secondary | ICD-10-CM | POA: Diagnosis not present

## 2016-02-24 MED ORDER — METHOCARBAMOL 500 MG PO TABS: 500.0000 mg | ORAL_TABLET | Freq: Four times a day (QID) | ORAL | Status: DC | PRN

## 2016-02-24 MED ORDER — HYDROCODONE-ACETAMINOPHEN 5-325 MG PO TABS: 1.0000 | ORAL_TABLET | ORAL | Status: DC | PRN

## 2016-02-24 MED ORDER — ASPIRIN 325 MG PO TBEC: 325.0000 mg | DELAYED_RELEASE_TABLET | Freq: Two times a day (BID) | ORAL | Status: DC

## 2016-02-24 NOTE — Clinical Social Work Note (Signed)
Clinical Social Worker facilitated patient discharge including contacting patient family and facility to confirm patient discharge plans.  Clinical information faxed to facility and family agreeable with plan.  Patient's dtr, Daleen Snook to transport patient to [facility].  RN to call report prior to discharge.  Clinical Social Worker will sign off for now as social work intervention is no longer needed. Please consult [place] again if new need arises.  Glendon Axe, MSW, LCSWA [phone] 02/24/2016 1:33 PM

## 2016-02-24 NOTE — Discharge Summary (Signed)
Patient ID: Dennis Hill MRN: [id number] DOB/AGE: 1946/01/15 70 y.o.  Admit date: 02/22/2016 Discharge date: 02/24/2016  Admission Diagnoses:  Principal Problem:   Primary osteoarthritis of left knee   Discharge Diagnoses:  Same  Past Medical History  Diagnosis Date  . DEPRESSION 03/15/2009  . DIVERTICULOSIS, COLON 03/15/2009  . 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 Hx of adenomatous colonic polyps 03/15/2009  . NEPHROLITHIASIS, HX OF 01/28/2010  . Anxiety   . Chronic insomnia   . Fatty liver   . Hyperlipidemia 02/10/2011  . Atrial bigeminy 2008    Dr. Caryl Comes  . Atrial fibrillation (HCC) flutter   . Pneumonia ~ 1950  . Sleep apnea   . [id number])     "monthly" (03/06/2013)  . BPH (benign prostatic hypertrophy) 03/21/2013  . Hx of cardiovascular stress test     ETT-Myoview (9/15):  Inferior infarct, no ischemia, EF 41% - Intermediate Risk  . CAD (coronary artery disease)     LHC (10/15):  pLAD 20%, mCFX 20%, pRCA 20%, PDA 20%, EF 40%  . NICM (nonischemic cardiomyopathy) ([place])     Echo (10/15):  Mild LVH, EF 35-40%, diff HK  . GERD 03/15/2009    no medications now  . Dysrhythmia     seen by Dr Caryl Comes   . DIABETES MELLITUS, TYPE II 01/28/2010    diet controlled. states was never diagnosed as diabetic    Surgeries: Procedure(s): TOTAL KNEE ARTHROPLASTY on 02/22/2016   Consultants:    Discharged Condition: Improved  Hospital Course: Dennis Hill is an 70 y.o. male who was admitted 02/22/2016 for operative treatment ofPrimary osteoarthritis of left knee. Patient has severe unremitting pain that affects sleep, daily activities, and work/hobbies. After pre-op clearance the patient was taken to the operating room on 02/22/2016 and underwent  Procedure(s): TOTAL KNEE ARTHROPLASTY.    Patient was given perioperative antibiotics: Anti-infectives    Start     Dose/Rate Route Frequency Ordered Stop   02/22/16 1930  ceFAZolin (ANCEF) IVPB 2g/100 mL premix     2  g 200 mL/hr over 30 Minutes Intravenous Every 6 hours 02/22/16 1851 02/23/16 0153   02/22/16 0945  ceFAZolin (ANCEF) 3 g in dextrose 5 % 50 mL IVPB     3 g 130 mL/hr over 30 Minutes Intravenous To ShortStay Surgical 02/21/16 0944 02/22/16 1045       Patient was given sequential compression devices, early ambulation, and chemoprophylaxis to prevent DVT.  Patient benefited maximally from hospital stay and there were no complications.    Recent vital signs: Patient Vitals for the past 24 hrs:  BP Temp Temp src Pulse Resp SpO2  02/24/16 0529 132/81 mmHg 98.4 F (36.9 C) Oral 84 18 96 %  02/23/16 2143 124/64 mmHg 98.8 F (37.1 C) Oral 85 18 95 %  02/23/16 1332 (!) 94/44 mmHg 98.6 F (37 C) Oral - 20 94 %     Recent laboratory studies: No results for input(s): WBC, HGB, HCT, PLT, NA, K, CL, CO2, BUN, CREATININE, GLUCOSE, INR, CALCIUM in the last 72 hours.  Invalid input(s): PT, 2   Discharge Medications:     Medication List    STOP taking these medications        predniSONE 10 MG tablet  Commonly known as:  DELTASONE      TAKE these medications        acetaminophen 500 MG tablet  Commonly known as:  TYLENOL  Take 500 mg by mouth every 6 (six) hours as  needed for pain.     albuterol 108 (90 Base) MCG/ACT inhaler  Commonly known as:  PROVENTIL HFA;VENTOLIN HFA  Inhale 2 puffs into the lungs every 6 (six) hours as needed for wheezing or shortness of breath.     aspirin 325 MG EC tablet  Take 1 tablet (325 mg total) by mouth 2 (two) times daily after a meal.     azithromycin 250 MG tablet  Commonly known as:  ZITHROMAX Z-PAK  2 tab by mouth on Day 1, then 1 per day for 4 days     esomeprazole 40 MG capsule  Commonly known as:  NEXIUM  Take 40 mg by mouth daily as needed (HEARTBURN).     fluticasone 50 MCG/ACT nasal spray  Commonly known as:  FLONASE  Place 2 sprays into both nostrils daily.     HYDROcodone-acetaminophen 5-325 MG tablet  Commonly known as:   NORCO/VICODIN  Take 1-2 tablets by mouth every 4 (four) hours as needed (breakthrough pain).     HYDROcodone-homatropine 5-1.5 MG/5ML syrup  Commonly known as:  HYCODAN  Take 5 mLs by mouth every 6 (six) hours as needed for cough.     methocarbamol 500 MG tablet  Commonly known as:  ROBAXIN  Take 1 tablet (500 mg total) by mouth every 6 (six) hours as needed for muscle spasms.     nitroGLYCERIN 0.4 MG SL tablet  Commonly known as:  NITROSTAT  Place 1 tablet (0.4 mg total) under the tongue every 5 (five) minutes as needed for chest pain.     tamsulosin 0.4 MG Caps capsule  Commonly known as:  FLOMAX  Take 1 capsule (0.4 mg total) by mouth daily.        Diagnostic Studies: Dg Chest 2 View  02/10/2016  CLINICAL DATA:  Preop evaluation for total knee arthroplasty. EXAM: CHEST  2 VIEW COMPARISON:  05/29/2014 FINDINGS: Normal heart size. There is no pleural effusion or edema identified. No airspace consolidation identified. Spondylosis noted within the thoracic spine. Eventration of the left hemidiaphragm noted. IMPRESSION: 1. No active cardiopulmonary abnormalities. Electronically Signed   By: Kerby Moors M.D.   On: 02/10/2016 09:38    Disposition: 01-Home or Self Care      Discharge Instructions    Call MD / Call 911    Complete by:  As directed   If you experience chest pain or shortness of breath, CALL 911 and be transported to the hospital emergency room.  If you develope a fever above 101 F, pus (white drainage) or increased drainage or redness at the wound, or calf pain, call your surgeon's office.     Constipation Prevention    Complete by:  As directed   Drink plenty of fluids.  Prune juice may be helpful.  You may use a stool softener, such as Colace (over the counter) 100 mg twice a day.  Use MiraLax (over the counter) for constipation as needed.     Diet - low sodium heart healthy    Complete by:  As directed      Discharge instructions    Complete by:  As directed    INSTRUCTIONS AFTER JOINT REPLACEMENT   Remove items at home which could result in a fall. This includes throw rugs or furniture in walking pathways ICE to the affected joint every three hours while awake for 30 minutes at a time, for at least the first 3-5 days, and then as needed for pain and swelling.  Continue to use  ice for pain and swelling. You may notice swelling that will progress down to the foot and ankle.  This is normal after surgery.  Elevate your leg when you are not up walking on it.   Continue to use the breathing machine you got in the hospital (incentive spirometer) which will help keep your temperature down.  It is common for your temperature to cycle up and down following surgery, especially at night when you are not up moving around and exerting yourself.  The breathing machine keeps your lungs expanded and your temperature down.   DIET:  As you were doing prior to hospitalization, we recommend a well-balanced diet.  DRESSING / WOUND CARE / SHOWERING  You may shower 3 days after surgery, but keep the wounds dry during showering.  You may use an occlusive plastic wrap (Press'n Seal for example), NO SOAKING/SUBMERGING IN THE BATHTUB.  If the bandage gets wet, change with a clean dry gauze.  If the incision gets wet, pat the wound dry with a clean towel.  ACTIVITY  Increase activity slowly as tolerated, but follow the weight bearing instructions below.   No driving for 6 weeks or until further direction given by your physician.  You cannot drive while taking narcotics.  No lifting or carrying greater than 10 lbs. until further directed by your surgeon. Avoid periods of inactivity such as sitting longer than an hour when not asleep. This helps prevent blood clots.  You may return to work once you are authorized by your doctor.     WEIGHT BEARING   Weight bearing as tolerated with assist device (walker, cane, etc) as directed, use it as long as suggested by your surgeon or  therapist, typically at least 4-6 weeks.   EXERCISES  Results after joint replacement surgery are often greatly improved when you follow the exercise, range of motion and muscle strengthening exercises prescribed by your doctor. Safety measures are also important to protect the joint from further injury. Any time any of these exercises cause you to have increased pain or swelling, decrease what you are doing until you are comfortable again and then slowly increase them. If you have problems or questions, call your caregiver or physical therapist for advice.   Rehabilitation is important following a joint replacement. After just a few days of immobilization, the muscles of the leg can become weakened and shrink (atrophy).  These exercises are designed to build up the tone and strength of the thigh and leg muscles and to improve motion. Often times heat used for twenty to thirty minutes before working out will loosen up your tissues and help with improving the range of motion but do not use heat for the first two weeks following surgery (sometimes heat can increase post-operative swelling).   These exercises can be done on a training (exercise) mat, on the floor, on a table or on a bed. Use whatever works the best and is most comfortable for you.    Use music or television while you are exercising so that the exercises are a pleasant break in your day. This will make your life better with the exercises acting as a break in your routine that you can look forward to.   Perform all exercises about fifteen times, three times per day or as directed.  You should exercise both the operative leg and the other leg as well.   Exercises include:   Quad Sets - Tighten up the muscle on the front of the thigh (  Quad) and hold for 5-10 seconds.   Straight Leg Raises - With your knee straight (if you were given a brace, keep it on), lift the leg to 60 degrees, hold for 3 seconds, and slowly lower the leg.  Perform this  exercise against resistance later as your leg gets stronger.  Leg Slides: Lying on your back, slowly slide your foot toward your buttocks, bending your knee up off the floor (only go as far as is comfortable). Then slowly slide your foot back down until your leg is flat on the floor again.  Angel Wings: Lying on your back spread your legs to the side as far apart as you can without causing discomfort.  Hamstring Strength:  Lying on your back, push your heel against the floor with your leg straight by tightening up the muscles of your buttocks.  Repeat, but this time bend your knee to a comfortable angle, and push your heel against the floor.  You may put a pillow under the heel to make it more comfortable if necessary.   A rehabilitation program following joint replacement surgery can speed recovery and prevent re-injury in the future due to weakened muscles. Contact your doctor or a physical therapist for more information on knee rehabilitation.    CONSTIPATION  Constipation is defined medically as fewer than three stools per week and severe constipation as less than one stool per week.  Even if you have a regular bowel pattern at home, your normal regimen is likely to be disrupted due to multiple reasons following surgery.  Combination of anesthesia, postoperative narcotics, change in appetite and fluid intake all can affect your bowels.   YOU MUST use at least one of the following options; they are listed in order of increasing strength to get the job done.  They are all available over the counter, and you may need to use some, POSSIBLY even all of these options:    Drink plenty of fluids (prune juice may be helpful) and high fiber foods Colace 100 mg by mouth twice a day  Senokot for constipation as directed and as needed Dulcolax (bisacodyl), take with full glass of water  Miralax (polyethylene glycol) once or twice a day as needed.  If you have tried all these things and are unable to have a  bowel movement in the first 3-4 days after surgery call either your surgeon or your primary doctor.    If you experience loose stools or diarrhea, hold the medications until you stool forms back up.  If your symptoms do not get better within 1 week or if they get worse, check with your doctor.  If you experience "the worst abdominal pain ever" or develop nausea or vomiting, please contact the office immediately for further recommendations for treatment.   ITCHING:  If you experience itching with your medications, try taking only a single pain pill, or even half a pain pill at a time.  You can also use Benadryl over the counter for itching or also to help with sleep.   TED HOSE STOCKINGS:  Use stockings on both legs until for at least 2 weeks or as directed by physician office. They may be removed at night for sleeping.  MEDICATIONS:  See your medication summary on the "After Visit Summary" that nursing will review with you.  You may have some home medications which will be placed on hold until you complete the course of blood thinner medication.  It is important for you to complete the  blood thinner medication as prescribed.  PRECAUTIONS:  If you experience chest pain or shortness of breath - call 911 immediately for transfer to the hospital emergency department.   If you develop a fever greater that 101 F, purulent drainage from wound, increased redness or drainage from wound, foul odor from the wound/dressing, or calf pain - CONTACT YOUR SURGEON.                                                   FOLLOW-UP APPOINTMENTS:  If you do not already have a post-op appointment, please call the office for an appointment to be seen by your surgeon.  Guidelines for how soon to be seen are listed in your "After Visit Summary", but are typically between 1-4 weeks after surgery.  OTHER INSTRUCTIONS:   Knee Replacement:  Do not place pillow under knee, focus on keeping the knee straight while resting. CPM  instructions: 0-90 degrees, 2 hours in the morning, 2 hours in the afternoon, and 2 hours in the evening. Place foam block, curve side up under heel at all times except when in CPM or when walking.  DO NOT modify, tear, cut, or change the foam block in any way.  MAKE SURE YOU:  Understand these instructions.  Get help right away if you are not doing well or get worse.    Thank you for letting us be a part of your medical care team.  It is a privilege we respect greatly.  We hope these instructions will help you stay on track for a fast and full recovery!     Increase activity slowly as tolerated    Complete by:  As directed            Follow-up Information    Follow up with Hessie Dibble, MD. Schedule an appointment as soon as possible for a visit in 2 weeks.   Specialty:  Orthopedic Surgery   Contact information:   [place] [address] [phone]        Signed: Rich Fuchs 02/24/2016, 1:19 PM

## 2016-02-24 NOTE — Progress Notes (Signed)
Patient transporting to [facility] for rehab via personal vehicle by daughter. Report called and given to nurse Tonya. Discussed discharge summary with patient and his daughter. Patient received Rx. Patient ready for discharge.

## 2016-02-24 NOTE — Progress Notes (Signed)
Physical Therapy Treatment Patient Details Name: LYDIA KRUMREY MRN: [id number] DOB: [birth year] Today's Date: 02/24/2016    History of Present Illness 70 y/o WM s/p L TKA (02/22/16) due to arthritis    PT Comments    Pt reports he has been doing therex all morning.  Pt able to ambulate in hallway with recliner follow due to lightheadedness at end of gait. Con't to recommend SNF.  Follow Up Recommendations  SNF;Supervision for mobility/OOB     Equipment Recommendations  None recommended by PT    Recommendations for Other Services       Precautions / Restrictions Precautions Precautions: Knee Restrictions LLE Weight Bearing: Weight bearing as tolerated    Mobility  Bed Mobility Overal bed mobility: Needs Assistance Bed Mobility: Supine to Sit     Supine to sit: Min assist     General bed mobility comments: A for L LE to come to EOB and A to it to ground, but able to get trunk upright  Transfers Overall transfer level: Needs assistance Equipment used: Rolling walker (2 wheeled) Transfers: Sit to/from Stand Sit to Stand: Min assist         General transfer comment: MIN A to power up and cues for hand placement  Ambulation/Gait Ambulation/Gait assistance: Min assist;+2 safety/equipment Ambulation Distance (Feet): 25 Feet Assistive device: Rolling walker (2 wheeled) Gait Pattern/deviations: Step-through pattern;Decreased step length - right;Decreased step length - left;Antalgic;Trunk flexed     General Gait Details: cues for proximity to RW and some A to progress RW at times.  Pt did begin feeling lightheaded at end of gait and sat in recliner with MIN A and cueing for LE positioning.   Stairs            Wheelchair Mobility    Modified Rankin (Stroke Patients Only)       Balance                                    Cognition Arousal/Alertness: Awake/alert Behavior During Therapy: WFL for tasks assessed/performed Overall  Cognitive Status: Within Functional Limits for tasks assessed                      Exercises Total Joint Exercises Goniometric ROM: -13 to 72 AROM    General Comments General comments (skin integrity, edema, etc.): Daughter present      Pertinent Vitals/Pain Pain Assessment: 0-10 Pain Score: 8  Pain Location: L knee Pain Descriptors / Indicators: Aching;Sore Pain Intervention(s): Limited activity within patient's tolerance;Monitored during session;Repositioned;RN gave pain meds during session    Home Living                      Prior Function            PT Goals (current goals can now be found in the care plan section) Acute Rehab PT Goals Patient Stated Goal: go to [facility] for rehab PT Goal Formulation: With patient/family Time For Goal Achievement: 02/28/16 Potential to Achieve Goals: Good Progress towards PT goals: Progressing toward goals    Frequency  7X/week    PT Plan Current plan remains appropriate    Co-evaluation             End of Session Equipment Utilized During Treatment: Gait belt Activity Tolerance: Patient tolerated treatment well Patient left: in chair;with call bell/phone within reach;with family/visitor present  Time: [id number] PT Time Calculation (min) (ACUTE ONLY): 19 min  Charges:  $Gait Training: 8-22 mins                    G Codes:      Cameryn Schum LUBECK 02/24/2016, 10:37 AM

## 2016-02-24 NOTE — Clinical Social Work Placement (Signed)
   CLINICAL SOCIAL WORK PLACEMENT  NOTE  Date:  02/24/2016  Patient Details  Name: Dennis Hill MRN: [id number] Date of Birth: Jan 03, 1946  Clinical Social Work is seeking post-discharge placement for this patient at the [place] level of care (*CSW will initial, date and re-position this form in  chart as items are completed):  Yes   Patient/family provided with [facility] Work Department's list of facilities offering this level of care within the geographic area requested by the patient (or if unable, by the patient's family).  Yes   Patient/family informed of their freedom to choose among providers that offer the needed level of care, that participate in Medicare, Medicaid or managed care program needed by the patient, have an available bed and are willing to accept the patient.  Yes   Patient/family informed of [facility]'s ownership interest in [facility] and [facility], as well as of the fact that they are under no obligation to receive care at these facilities.  PASRR submitted to EDS on 02/23/16     PASRR number received on 02/23/16     Existing PASRR number confirmed on       FL2 transmitted to all facilities in geographic area requested by pt/family on 02/23/16     FL2 transmitted to all facilities within larger geographic area on       Patient informed that his/her managed care company has contracts with or will negotiate with certain facilities, including the following:        Yes   Patient/family informed of bed offers received.  Patient [facility] bed [facility]     Physician recommends and patient chooses bed at      Patient to be transferred to [facility] on 02/24/16.  Patient to be transferred to facility by  (Pt's dtr, Daleen Snook)     Patient family notified on 02/24/16 of transfer.  Name of family member notified:   (Pt's dtr, Daleen Snook )     PHYSICIAN Please sign FL2, Please prepare priority discharge  summary, including medications     Additional Comment:    _______________________________________________ Rozell Searing, LCSW 02/24/2016, 1:31 PM

## 2016-02-25 ENCOUNTER — Encounter: Payer: Self-pay | Admitting: Internal Medicine

## 2016-02-25 ENCOUNTER — Non-Acute Institutional Stay (SKILLED_NURSING_FACILITY): Payer: Medicare Other | Admitting: Internal Medicine

## 2016-02-25 DIAGNOSIS — R2681 Unsteadiness on feet: Secondary | ICD-10-CM | POA: Diagnosis not present

## 2016-02-25 DIAGNOSIS — N400 Enlarged prostate without lower urinary tract symptoms: Secondary | ICD-10-CM

## 2016-02-25 DIAGNOSIS — J309 Allergic rhinitis, unspecified: Secondary | ICD-10-CM

## 2016-02-25 DIAGNOSIS — M1712 Unilateral primary osteoarthritis, left knee: Secondary | ICD-10-CM

## 2016-02-25 DIAGNOSIS — K5901 Slow transit constipation: Secondary | ICD-10-CM | POA: Diagnosis not present

## 2016-02-25 DIAGNOSIS — K219 Gastro-esophageal reflux disease without esophagitis: Secondary | ICD-10-CM

## 2016-02-25 NOTE — Progress Notes (Signed)
LOCATION: [place]  PCP: [name], MD   Code Status: Full Code  Goals of care: Advanced Directive information Advanced Directives 02/23/2016  Does patient have an advance directive? Yes  Type of Paramedic of [place];Living will  Does patient want to make changes to advanced directive? No - Patient declined  Copy of advanced directive(s) in chart? No - copy requested       Extended Emergency Contact Information Primary Emergency Contact: Lavone Orn, [place] [address] [place] Mobile Phone: [phone] Relation: Other Secondary Emergency Contact: Arnette Norris Address: [address]          [place], [place] [address] Johnnette Litter of [place] Mobile Phone: [phone] Relation: Daughter   Allergies  Allergen Reactions  . Lisinopril Swelling    Tongue swelling  . Xarelto [Rivaroxaban] Swelling    Tongue swelling  . Lipitor [Atorvastatin] Other (See Comments)    Mental foggy    Chief Complaint  Patient presents with  . New Admit To SNF    New Admission     HPI:  Patient is a 70 y.o. male seen today for short term rehabilitation post hospital admission from 02/22/16-02/24/16 with left knee OA. He underwent left total knee arthroplasty. He is seen in his room today. His pain is under control with current pain regimen. He has been on azithromycin and hycodan but denies any cough, trouble breathing at present.   Review of Systems:  Constitutional: Negative for fever, chills, diaphoresis. Energy level is slowly coming back.  HENT: Negative for headache, congestion, nasal discharge, hearing loss, sore throat, difficulty swallowing.   Eyes: Negative for blurred vision, double vision and discharge. Wears glasses.  Respiratory: Negative for cough, shortness of breath and wheezing.   Cardiovascular: Negative for chest pain, palpitations, leg swelling.  Gastrointestinal: Negative for heartburn, nausea,  vomiting, abdominal pain. Positive for poor appetite. Last bowel movement was Monday night. Passing gas. At home, had regular bowel movement.  Genitourinary: Negative for dysuria and flank pain.  Musculoskeletal: Negative for back pain, fall in the facility.  Skin: Negative for itching, rash.  Neurological: Positive for dizziness with change of position. Psychiatric/Behavioral: Negative for depression   Past Medical History  Diagnosis Date  . DEPRESSION 03/15/2009  . DIVERTICULOSIS, COLON 03/15/2009  . 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 Hx of adenomatous colonic polyps 03/15/2009  . NEPHROLITHIASIS, HX OF 01/28/2010  . Anxiety   . Chronic insomnia   . Fatty liver   . Hyperlipidemia 02/10/2011  . Atrial bigeminy 2008    Dr. Caryl Comes  . Atrial fibrillation (HCC) flutter   . Pneumonia ~ 1950  . Sleep apnea   . [id number])     "monthly" (03/06/2013)  . BPH (benign prostatic hypertrophy) 03/21/2013  . Hx of cardiovascular stress test     ETT-Myoview (9/15):  Inferior infarct, no ischemia, EF 41% - Intermediate Risk  . CAD (coronary artery disease)     LHC (10/15):  pLAD 20%, mCFX 20%, pRCA 20%, PDA 20%, EF 40%  . NICM (nonischemic cardiomyopathy) ([place])     Echo (10/15):  Mild LVH, EF 35-40%, diff HK  . GERD 03/15/2009    no medications now  . Dysrhythmia     seen by Dr Caryl Comes   . DIABETES MELLITUS, TYPE II 01/28/2010    diet controlled. states was never diagnosed as diabetic   Past Surgical History  Procedure Laterality Date  . Appendectomy    .  Cholecystectomy  2006  . Tonsillectomy and adenoidectomy  ~ 1952  . Inguinal hernia repair Right 1986  . Cardioversion N/A 02/06/2013    Procedure: CARDIOVERSION;  Surgeon: Evans Lance, MD;  Location: [facility];  Service: Cardiovascular;  Laterality: N/A;  . Tee without cardioversion N/A 03/05/2013    Procedure: TRANSESOPHAGEAL ECHOCARDIOGRAM (TEE);  Surgeon: Larey Dresser, MD;  Location: [facility];  Service: Cardiovascular;   Laterality: N/A;  Jeannene Patella Greggory Brandy  . Atrial flutter ablation  03/06/2013  . Atrial flutter ablation N/A 03/06/2013    Procedure: ATRIAL FLUTTER ABLATION;  Surgeon: Deboraha Sprang, MD;  Location: [facility] CATH LAB;  Service: Cardiovascular;  Laterality: N/A;  . Left heart catheterization with coronary angiogram N/A 06/19/2014    Procedure: LEFT HEART CATHETERIZATION WITH CORONARY ANGIOGRAM;  Surgeon: Burnell Blanks, MD;  Location: [facility] CATH LAB;  Service: Cardiovascular;  Laterality: N/A;  . Total knee arthroplasty Left 02/22/2016    Procedure: TOTAL KNEE ARTHROPLASTY;  Surgeon: Melrose Nakayama, MD;  Location: [facility];  Service: Orthopedics;  Laterality: Left;   Social History:   reports that he quit smoking about 36 years ago. His smoking use included Cigarettes. He has a 13 pack-year smoking history. He has never used smokeless tobacco. He reports that he drinks about 0.6 oz of alcohol per week. He reports that he does not use illicit drugs.  Family History  Problem Relation Age of Onset  . Lung cancer Mother 65  . Colon cancer Mother   . CAD Neg Hx   . Heart attack Neg Hx   . Stroke Neg Hx   . Cancer Mother   . Cancer Maternal Grandmother   . Diabetes Neg Hx     Medications:   Medication List       This list is accurate as of: 02/25/16  2:06 PM.  Always use your most recent med list.               acetaminophen 500 MG tablet  Commonly known as:  TYLENOL  Take 500 mg by mouth every 6 (six) hours as needed for pain.     albuterol 108 (90 Base) MCG/ACT inhaler  Commonly known as:  PROVENTIL HFA;VENTOLIN HFA  Inhale 2 puffs into the lungs every 6 (six) hours as needed for wheezing or shortness of breath.     aspirin 325 MG EC tablet  Take 1 tablet (325 mg total) by mouth 2 (two) times daily after a meal.     azithromycin 250 MG tablet  Commonly known as:  ZITHROMAX  Take 250 mg by mouth daily. Take 2 tablets by mouth Friday morning for infection. Stop date 02/25/16     azithromycin 250  MG tablet  Commonly known as:  ZITHROMAX  Take 250 mg by mouth daily. Stop date 02/29/16     esomeprazole 40 MG capsule  Commonly known as:  NEXIUM  Take 40 mg by mouth daily as needed (HEARTBURN).     fluticasone 50 MCG/ACT nasal spray  Commonly known as:  FLONASE  Place 2 sprays into both nostrils daily.     HYDROcodone-acetaminophen 5-325 MG tablet  Commonly known as:  NORCO/VICODIN  Take 1-2 tablets by mouth every 4 (four) hours as needed (breakthrough pain).     HYDROcodone-homatropine 5-1.5 MG/5ML syrup  Commonly known as:  HYCODAN  Take 5 mLs by mouth every 6 (six) hours as needed for cough.     methocarbamol 500 MG tablet  Commonly known as:  ROBAXIN  Take 1 tablet (500 mg total) by mouth every 6 (six) hours as needed for muscle spasms.     nitroGLYCERIN 0.4 MG SL tablet  Commonly known as:  NITROSTAT  Place 1 tablet (0.4 mg total) under the tongue every 5 (five) minutes as needed for chest pain.     tamsulosin 0.4 MG Caps capsule  Commonly known as:  FLOMAX  Take 1 capsule (0.4 mg total) by mouth daily.        Immunizations: Immunization History  Administered Date(s) Administered  . Pneumococcal Conjugate-13 01/09/2014  . Pneumococcal Polysaccharide-23 03/15/2009  . Td 03/15/2009     Physical Exam: Filed Vitals:   02/25/16 1400  BP: 128/70  Pulse: 82  Temp: 98.6 F (37 C)  TempSrc: Oral  Resp: 18  Height: 6\' 5"  (1.956 m)  Weight: 250 lb (113.399 kg)  SpO2: 98%   Body mass index is 29.64 kg/(m^2).  General- elderly male, overweight, in no acute distress Head- normocephalic, atraumatic Nose- no maxillary or frontal sinus tenderness, no nasal discharge Throat- moist mucus membrane  Eyes- PERRLA, EOMI, no pallor, no icterus, no discharge, normal conjunctiva, normal sclera Neck- no cervical lymphadenopathy Cardiovascular- normal s1,s2, no murmur, trace leg edema Respiratory- bilateral clear to auscultation, no wheeze, no rhonchi, no crackles, no  use of accessory muscles Abdomen- bowel sounds present, soft, non tender Musculoskeletal- able to move all 4 extremities, limited left knee range of motion Neurological- alert and oriented to person, place and time Skin- warm and dry, left knee surgical incision with aquacel dressing in place Psychiatry- normal mood and affect    Labs reviewed: Basic Metabolic Panel:  Recent Labs  01/18/16 1516 02/10/16 0844  NA 141 139  K 4.4 4.2  CL 107 107  CO2 28 27  GLUCOSE 142* 117*  BUN 25* 21*  CREATININE 1.17 1.06  CALCIUM 9.9 10.0   Liver Function Tests:  Recent Labs  01/18/16 1516  AST 17  ALT 16  ALKPHOS 90  BILITOT 0.6  PROT 6.3  ALBUMIN 4.1   No results for input(s): LIPASE, AMYLASE in the last 8760 hours. No results for input(s): AMMONIA in the last 8760 hours. CBC:  Recent Labs  01/18/16 1516 02/10/16 0844  WBC 7.5 7.5  NEUTROABS 4.6 4.8  HGB 13.7 13.6  HCT 40.9 43.1  MCV 85.1 87.8  PLT 186.0 242   Cardiac Enzymes: No results for input(s): CKTOTAL, CKMB, CKMBINDEX, TROPONINI in the last 8760 hours. BNP: Invalid input(s): POCBNP CBG:  Recent Labs  02/22/16 0902 02/22/16 1314  GLUCAP 124* 126*    Radiological Exams: Dg Chest 2 View  02/10/2016  CLINICAL DATA:  Preop evaluation for total knee arthroplasty. EXAM: CHEST  2 VIEW COMPARISON:  05/29/2014 FINDINGS: Normal heart size. There is no pleural effusion or edema identified. No airspace consolidation identified. Spondylosis noted within the thoracic spine. Eventration of the left hemidiaphragm noted. IMPRESSION: 1. No active cardiopulmonary abnormalities. Electronically Signed   By: Kerby Moors M.D.   On: 02/10/2016 09:38    Assessment/Plan  Unsteady gait Post left knee surgery. Will have patient work with PT/OT as tolerated to regain strength and restore function.  Fall precautions are in place.  Left knee OA S/p left total knee arthroplasty. Has orthopedics follow up. Continue tylenol 500  mg q6h prn pain and norco 5-325 mg 1-2 tab q4h prn pain. Continue robaxin 500 mg q6h prn muscle spasm. Continue aspirin 325 mg bid for dvt prophylaxis.   Constipation Start colace 100  mg bid and miralax daily as needed for now and monitor  BPH Continue flomax, stable  gerd Stable, continue nexium  Allergic rhinitis Continue flonase, stable for now  D/C azithromycin and hycodan with no clear indication for use. Patient agrees.    Goals of care: short term rehabilitation   Labs/tests ordered: cbc, cmp 02/28/16   Family/ staff Communication: reviewed care plan with patient and nursing supervisor    Blanchie Serve, MD Internal Medicine [place], [place] [address] Cell Phone (Monday-Friday 8 am - 5 pm): [phone] On Call: [phone] and follow prompts after 5 pm and on weekends Office Phone: [phone] Office Fax: [phone]

## 2016-03-03 DIAGNOSIS — M1712 Unilateral primary osteoarthritis, left knee: Secondary | ICD-10-CM | POA: Diagnosis not present

## 2016-03-03 LAB — BASIC METABOLIC PANEL
BUN: 31 mg/dL — AB (ref 4–21)
CREATININE: 1.2 mg/dL (ref 0.6–1.3)
Glucose: 110 mg/dL
Potassium: 4.6 mmol/L (ref 3.4–5.3)
Sodium: 136 mmol/L — AB (ref 137–147)

## 2016-03-03 LAB — CBC AND DIFFERENTIAL
HCT: 37 % — AB (ref 41–53)
HEMOGLOBIN: 12.1 g/dL — AB (ref 13.5–17.5)
NEUTROS ABS: 7488 /uL
PLATELETS: 305 10*3/uL (ref 150–399)
WBC: 11.7 10*3/mL

## 2016-03-06 ENCOUNTER — Non-Acute Institutional Stay (SKILLED_NURSING_FACILITY): Payer: Medicare Other | Admitting: Adult Health

## 2016-03-06 ENCOUNTER — Encounter: Payer: Self-pay | Admitting: Adult Health

## 2016-03-06 DIAGNOSIS — D72829 Elevated white blood cell count, unspecified: Secondary | ICD-10-CM

## 2016-03-06 DIAGNOSIS — N289 Disorder of kidney and ureter, unspecified: Secondary | ICD-10-CM

## 2016-03-06 NOTE — Progress Notes (Signed)
Patient ID: Dennis Hill, male   DOB: 23-Aug-1946, 70 y.o.   MRN: [id number]    DATE:  03/06/2016   MRN:  [id number]  BIRTHDAY: 01/22/1946  Facility:  [facility] Home Location:  [facility] Room Number: 1202-P  LEVEL OF CARE:  SNF (31)  Contact Information    Name Relation Home Work [place] Other   Success Daughter   [phone]       Code Status History    Date Active Date Inactive Code Status Order ID Comments User Context   06/19/2014 10:25 AM 06/19/2014  4:15 PM Full Code [id number]  Burnell Blanks, MD Inpatient    Advance Directive Documentation        Most Recent Value   Type of Advance Directive  Healthcare Power of Attorney, Living will   Pre-existing out of facility DNR order (yellow form or pink MOST form)     "MOST" Form in Place?         Chief Complaint  Patient presents with  . Acute Visit    Leukocytosis    HISTORY OF PRESENT ILLNESS:  This is a 70 year old male who is being seen due to wbc 11.7 and creatinine 1.21. Patient went out of the facility on 03/03/16 for orthopedic follow-up then went to his house accompanied by daughter. When he came back in the afternoon to facility, he was noted to be weak. Labs were done.  Patient did not have any fever, cough nor redness on his left knee surgical wound. He is alert and oriented. Na 136, K 4.6 creatinine 1.21 and BUN 31.  He has been admitted to [facility] on 02/24/16 from [facility] with osteoarthritis of his left knee for which he had left total knee arthroplasty on 02/22/16. He has been admitted for a short-term rehabilitation.  PAST MEDICAL HISTORY:  Past Medical History  Diagnosis Date  . DEPRESSION 03/15/2009  . DIVERTICULOSIS, COLON 03/15/2009  . 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 Hx of adenomatous colonic polyps 03/15/2009  . NEPHROLITHIASIS, HX OF 01/28/2010  . Anxiety   . Chronic insomnia   . Fatty  liver   . Hyperlipidemia 02/10/2011  . Atrial bigeminy 2008    Dr. Caryl Comes  . Atrial fibrillation (HCC) flutter   . Pneumonia ~ 1950  . Sleep apnea   . [id number])     "monthly" (03/06/2013)  . BPH (benign prostatic hypertrophy) 03/21/2013  . Hx of cardiovascular stress test     ETT-Myoview (9/15):  Inferior infarct, no ischemia, EF 41% - Intermediate Risk  . CAD (coronary artery disease)     LHC (10/15):  pLAD 20%, mCFX 20%, pRCA 20%, PDA 20%, EF 40%  . NICM (nonischemic cardiomyopathy) ([place])     Echo (10/15):  Mild LVH, EF 35-40%, diff HK  . GERD 03/15/2009    no medications now  . Dysrhythmia     seen by Dr Caryl Comes   . DIABETES MELLITUS, TYPE II 01/28/2010    diet controlled. states was never diagnosed as diabetic     CURRENT MEDICATIONS: Reviewed  Patient's Medications  New Prescriptions   No medications on file  Previous Medications   ACETAMINOPHEN (TYLENOL) 500 MG TABLET    Take 500 mg by mouth every 6 (six) hours as needed for pain.   ALBUTEROL (PROVENTIL HFA;VENTOLIN HFA) 108 (90 BASE) MCG/ACT INHALER    Inhale 2 puffs into the lungs every 6 (six) hours as  needed for wheezing or shortness of breath.   ASPIRIN EC 325 MG EC TABLET    Take 1 tablet (325 mg total) by mouth 2 (two) times daily after a meal.   ATORVASTATIN (LIPITOR) 10 MG TABLET    Take 10 mg by mouth at bedtime.   DOCUSATE SODIUM (COLACE) 100 MG CAPSULE    Take 100 mg by mouth 2 (two) times daily.   FLUTICASONE (FLONASE) 50 MCG/ACT NASAL SPRAY    Place 2 sprays into both nostrils daily.   HYDROCODONE-ACETAMINOPHEN (NORCO/VICODIN) 5-325 MG TABLET    Take 1-2 tablets by mouth every 4 (four) hours as needed for moderate pain.    METHOCARBAMOL (ROBAXIN) 500 MG TABLET    Take 1 tablet (500 mg total) by mouth every 6 (six) hours as needed for muscle spasms.   NITROGLYCERIN (NITROSTAT) 0.4 MG SL TABLET    Place 1 tablet (0.4 mg total) under the tongue every 5 (five) minutes as needed for chest pain.   OMEPRAZOLE  (PRILOSEC) 20 MG CAPSULE    Take 20 mg by mouth daily as needed.   POLYETHYLENE GLYCOL (MIRALAX / GLYCOLAX) PACKET    Take 17 g by mouth daily as needed for mild constipation.   TAMSULOSIN (FLOMAX) 0.4 MG CAPS CAPSULE    Take 1 capsule (0.4 mg total) by mouth daily.  Modified Medications   No medications on file  Discontinued Medications   AZITHROMYCIN (ZITHROMAX) 250 MG TABLET    Take 250 mg by mouth daily. Take 2 tablets by mouth Friday morning for infection. Stop date 02/25/16   AZITHROMYCIN (ZITHROMAX) 250 MG TABLET    Take 250 mg by mouth daily. Stop date 02/29/16   ESOMEPRAZOLE (NEXIUM) 40 MG CAPSULE    Take 40 mg by mouth daily as needed (HEARTBURN).   HYDROCODONE-ACETAMINOPHEN (NORCO/VICODIN) 5-325 MG TABLET    Take 1-2 tablets by mouth every 4 (four) hours as needed (breakthrough pain).   HYDROCODONE-HOMATROPINE (HYCODAN) 5-1.5 MG/5ML SYRUP    Take 5 mLs by mouth every 6 (six) hours as needed for cough.     Allergies  Allergen Reactions  . Lisinopril Swelling    Tongue swelling  . Xarelto [Rivaroxaban] Swelling    Tongue swelling  . Lipitor [Atorvastatin] Other (See Comments)    Mental foggy     REVIEW OF SYSTEMS:  GENERAL: no change in appetite, no fatigue, no weight changes, no fever, chills or weakness EYES: Denies change in vision, dry eyes, eye pain, itching or discharge EARS: Denies change in hearing, ringing in ears, or earache NOSE: Denies nasal congestion or epistaxis MOUTH and THROAT: Denies oral discomfort, gingival pain or bleeding, pain from teeth or hoarseness   RESPIRATORY: no cough, SOB, DOE, wheezing, hemoptysis CARDIAC: no chest pain, edema or palpitations GI: no abdominal pain, diarrhea, constipation, heart burn, nausea or vomiting GU: Denies dysuria, frequency, hematuria, incontinence, or discharge PSYCHIATRIC: Denies feeling of depression or anxiety. No report of hallucinations, insomnia, paranoia, or agitation   PHYSICAL EXAMINATION  GENERAL  APPEARANCE: Well nourished. In no acute distress. Normal body habitus SKIN:  Left knee surgical incision has steri-strips and dry dressing, no erythema HEAD: Normal in size and contour. No evidence of trauma EYES: Lids open and close normally. No blepharitis, entropion or ectropion. PERRL. Conjunctivae are clear and sclerae are white. Lenses are without opacity EARS: Pinnae are normal. Patient hears normal voice tunes of the examiner MOUTH and THROAT: Lips are without lesions. Oral mucosa is moist and without lesions. Tongue is  normal in shape, size, and color and without lesions NECK: supple, trachea midline, no neck masses, no thyroid tenderness, no thyromegaly LYMPHATICS: no LAN in the neck, no supraclavicular LAN RESPIRATORY: breathing is even & unlabored, BS CTAB CARDIAC: RRR, no murmur,no extra heart sounds, no edema GI: abdomen soft, normal BS, no masses, no tenderness, no hepatomegaly, no splenomegaly EXTREMITIES:  Able to move X 4 extremities PSYCHIATRIC: Alert and oriented X 3. Affect and behavior are appropriate  LABS/RADIOLOGY: Labs reviewed: Basic Metabolic Panel:  Recent Labs  01/18/16 1516 02/10/16 0844 03/03/16  NA 141 139 136*  K 4.4 4.2 4.6  CL 107 107  --   CO2 28 27  --   GLUCOSE 142* 117*  --   BUN 25* 21* 31*  CREATININE 1.17 1.06 1.2  CALCIUM 9.9 10.0  --    Liver Function Tests:  Recent Labs  01/18/16 1516  AST 17  ALT 16  ALKPHOS 90  BILITOT 0.6  PROT 6.3  ALBUMIN 4.1   CBC:  Recent Labs  01/18/16 1516 02/10/16 0844 03/03/16  WBC 7.5 7.5 11.7  NEUTROABS 4.6 4.8 7488  HGB 13.7 13.6 12.1*  HCT 40.9 43.1 37*  MCV 85.1 87.8  --   PLT 186.0 242 305   Lipid Panel:  Recent Labs  07/05/15 0744 01/18/16 1516  HDL 34* 34.00*    CBG:  Recent Labs  02/22/16 0902 02/22/16 1314  GLUCAP 124* 126*      Dg Chest 2 View  02/10/2016  CLINICAL DATA:  Preop evaluation for total knee arthroplasty. EXAM: CHEST  2 VIEW COMPARISON:   05/29/2014 FINDINGS: Normal heart size. There is no pleural effusion or edema identified. No airspace consolidation identified. Spondylosis noted within the thoracic spine. Eventration of the left hemidiaphragm noted. IMPRESSION: 1. No active cardiopulmonary abnormalities. Electronically Signed   By: Kerby Moors M.D.   On: 02/10/2016 09:38    ASSESSMENT/PLAN:  Leukocytosis - wbc 11.7; no identifiable source of infection, no fever, cough nor dysuria; re-check CBC in AM to check if it will trend up  Renal insufficiency - creatinine 1.21; will re-check in AM; encourage oral fluid intake; re-check BMP in AM   Talked to daughter via phone regarding lab findings and she was appreciative of the information.      Durenda Age, NP [employer] [phone]

## 2016-03-07 ENCOUNTER — Non-Acute Institutional Stay (SKILLED_NURSING_FACILITY): Payer: Medicare Other | Admitting: Adult Health

## 2016-03-07 ENCOUNTER — Encounter: Payer: Self-pay | Admitting: Adult Health

## 2016-03-07 DIAGNOSIS — K5901 Slow transit constipation: Secondary | ICD-10-CM

## 2016-03-07 DIAGNOSIS — N400 Enlarged prostate without lower urinary tract symptoms: Secondary | ICD-10-CM

## 2016-03-07 DIAGNOSIS — K219 Gastro-esophageal reflux disease without esophagitis: Secondary | ICD-10-CM | POA: Diagnosis not present

## 2016-03-07 DIAGNOSIS — N289 Disorder of kidney and ureter, unspecified: Secondary | ICD-10-CM

## 2016-03-07 DIAGNOSIS — M1712 Unilateral primary osteoarthritis, left knee: Secondary | ICD-10-CM | POA: Diagnosis not present

## 2016-03-07 DIAGNOSIS — J309 Allergic rhinitis, unspecified: Secondary | ICD-10-CM

## 2016-03-07 DIAGNOSIS — R2681 Unsteadiness on feet: Secondary | ICD-10-CM | POA: Diagnosis not present

## 2016-03-07 DIAGNOSIS — D72829 Elevated white blood cell count, unspecified: Secondary | ICD-10-CM

## 2016-03-07 LAB — CBC AND DIFFERENTIAL
HCT: 41 % (ref 41–53)
Hemoglobin: 12.7 g/dL — AB (ref 13.5–17.5)
Neutrophils Absolute: 5 /uL
Platelets: 357 10*3/uL (ref 150–399)
WBC: 8.8 10^3/mL

## 2016-03-07 LAB — BASIC METABOLIC PANEL
BUN: 21 mg/dL (ref 4–21)
CREATININE: 1 mg/dL (ref 0.6–1.3)
GLUCOSE: 107 mg/dL
POTASSIUM: 5 mmol/L (ref 3.4–5.3)
SODIUM: 142 mmol/L (ref 137–147)

## 2016-03-07 NOTE — Progress Notes (Signed)
Patient ID: Dennis Hill, male   DOB: August 27, 1946, 70 y.o.   MRN: [id number]    DATE:   03/07/16  MRN:  [id number]  BIRTHDAY: 1945/11/27  Facility:  [facility] Home Location:  [place] Room Number: 1202-P  LEVEL OF CARE:  SNF (31)  Contact Information    Name Relation Home Work [place] Other   [place] Daughter   [phone]       Code Status History    Date Active Date Inactive Code Status Order ID Comments User Context   06/19/2014 10:25 AM 06/19/2014  4:15 PM Full Code [id number]  Burnell Blanks, MD Inpatient    Advance Directive Documentation        Most Recent Value   Type of Advance Directive  Healthcare Power of Attorney, Living will   Pre-existing out of facility DNR order (yellow form or pink MOST form)     "MOST" Form in Place?         Chief Complaint  Patient presents with  . Discharge Note    HISTORY OF PRESENT ILLNESS:  This is a 70 year old male who is for discharge home with Home health PT and OT. He will discharge home with medications. No equipment needed.  He has been admitted to [facility] on 02/24/16 from [facility] with osteoarthritis of his left knee for which he had left total knee arthroplasty on 02/22/16.   Patient was admitted to this facility for short-term rehabilitation after the patient's recent hospitalization.  Patient has completed SNF rehabilitation and therapy has cleared the patient for discharge.   PAST MEDICAL HISTORY:  Past Medical History  Diagnosis Date  . DEPRESSION 03/15/2009  . DIVERTICULOSIS, COLON 03/15/2009  . 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 Hx of adenomatous colonic polyps 03/15/2009  . NEPHROLITHIASIS, HX OF 01/28/2010  . Anxiety   . Chronic insomnia   . Fatty liver   . Hyperlipidemia 02/10/2011  . Atrial bigeminy 2008    Dr. Caryl Comes  . Atrial fibrillation (HCC) flutter   . Pneumonia ~ 1950  . Sleep apnea   .  [id number])     "monthly" (03/06/2013)  . BPH (benign prostatic hypertrophy) 03/21/2013  . Hx of cardiovascular stress test     ETT-Myoview (9/15):  Inferior infarct, no ischemia, EF 41% - Intermediate Risk  . CAD (coronary artery disease)     LHC (10/15):  pLAD 20%, mCFX 20%, pRCA 20%, PDA 20%, EF 40%  . NICM (nonischemic cardiomyopathy) ([place])     Echo (10/15):  Mild LVH, EF 35-40%, diff HK  . GERD 03/15/2009    no medications now  . Dysrhythmia     seen by Dr Caryl Comes      CURRENT MEDICATIONS: Reviewed  Patient's Medications  New Prescriptions   No medications on file  Previous Medications   ACETAMINOPHEN (TYLENOL) 500 MG TABLET    Take 500 mg by mouth every 6 (six) hours as needed for pain.   ALBUTEROL (PROVENTIL HFA;VENTOLIN HFA) 108 (90 BASE) MCG/ACT INHALER    Inhale 2 puffs into the lungs every 6 (six) hours as needed for wheezing or shortness of breath.   ASPIRIN 81 MG TABLET    Take 81 mg by mouth daily.   ASPIRIN EC 325 MG EC TABLET    Take 1 tablet (325 mg total) by mouth 2 (two) times daily after a meal.   FLUTICASONE (FLONASE) 50 MCG/ACT NASAL SPRAY  Place 2 sprays into both nostrils daily.   HYDROCODONE-ACETAMINOPHEN (NORCO/VICODIN) 5-325 MG TABLET    Take 1-2 tablets by mouth every 4 (four) hours as needed for moderate pain.    IBUPROFEN (ADVIL,MOTRIN) 200 MG TABLET    Take 600 mg by mouth every 6 (six) hours as needed for moderate pain.   METHOCARBAMOL (ROBAXIN) 500 MG TABLET    Take 1 tablet (500 mg total) by mouth every 6 (six) hours as needed for muscle spasms.   NITROGLYCERIN (NITROSTAT) 0.4 MG SL TABLET    Place 1 tablet (0.4 mg total) under the tongue every 5 (five) minutes as needed for chest pain.   OMEPRAZOLE (PRILOSEC) 20 MG CAPSULE    Take 20 mg by mouth daily as needed.   POLYETHYLENE GLYCOL (MIRALAX / GLYCOLAX) PACKET    Take 17 g by mouth daily as needed for mild constipation.   TAMSULOSIN (FLOMAX) 0.4 MG CAPS CAPSULE    Take 1 capsule (0.4 mg total) by  mouth daily.  Modified Medications   No medications on file  Discontinued Medications   ATORVASTATIN (LIPITOR) 10 MG TABLET    Take 10 mg by mouth at bedtime.   DOCUSATE SODIUM (COLACE) 100 MG CAPSULE    Take 100 mg by mouth 2 (two) times daily.     Allergies  Allergen Reactions  . Lisinopril Swelling    Tongue swelling  . Xarelto [Rivaroxaban] Swelling    Tongue swelling  . Lipitor [Atorvastatin] Other (See Comments)    Mental foggy     REVIEW OF SYSTEMS:  GENERAL: no change in appetite, no fatigue, no weight changes, no fever, chills or weakness EYES: Denies change in vision, dry eyes, eye pain, itching or discharge EARS: Denies change in hearing, ringing in ears, or earache NOSE: Denies nasal congestion or epistaxis MOUTH and THROAT: Denies oral discomfort, gingival pain or bleeding, pain from teeth or hoarseness   RESPIRATORY: no cough, SOB, DOE, wheezing, hemoptysis CARDIAC: no chest pain, edema or palpitations GI: no abdominal pain, diarrhea, constipation, heart burn, nausea or vomiting GU: Denies dysuria, frequency, hematuria, incontinence, or discharge PSYCHIATRIC: Denies feeling of depression or anxiety. No report of hallucinations, insomnia, paranoia, or agitation   PHYSICAL EXAMINATION  GENERAL APPEARANCE: Well nourished. In no acute distress. Normal body habitus SKIN:  Left knee surgical incision has steri-strips and dry dressing, no erythema HEAD: Normal in size and contour. No evidence of trauma EYES: Lids open and close normally. No blepharitis, entropion or ectropion. PERRL. Conjunctivae are clear and sclerae are white. Lenses are without opacity EARS: Pinnae are normal. Patient hears normal voice tunes of the examiner MOUTH and THROAT: Lips are without lesions. Oral mucosa is moist and without lesions. Tongue is normal in shape, size, and color and without lesions NECK: supple, trachea midline, no neck masses, no thyroid tenderness, no  thyromegaly LYMPHATICS: no LAN in the neck, no supraclavicular LAN RESPIRATORY: breathing is even & unlabored, BS CTAB CARDIAC: RRR, no murmur,no extra heart sounds, no edema GI: abdomen soft, normal BS, no masses, no tenderness, no hepatomegaly, no splenomegaly EXTREMITIES:  Able to move X 4 extremities PSYCHIATRIC: Alert and oriented X 3. Affect and behavior are appropriate  LABS/RADIOLOGY: Labs reviewed: Basic Metabolic Panel:  Recent Labs  01/18/16 1516 02/10/16 0844  03/07/16    NA 141 139  < > 142    K 4.4 4.2  < > 5.0    CL 107 107  --   --     CO2  28 27  --   --     GLUCOSE 142* 117*  --   --     BUN 25* 21*  < > 21    CREATININE 1.17 1.06  < > 1.0    CALCIUM 9.9 10.0  --   --     < > = values in this interval not displayed. Liver Function Tests:  Recent Labs  01/18/16 1516  AST 17  ALT 16  ALKPHOS 90  BILITOT 0.6  PROT 6.3  ALBUMIN 4.1   CBC:  Recent Labs  01/18/16 1516 02/10/16 0844 03/03/16 03/07/16    WBC 7.5 7.5 11.7 8.8    NEUTROABS 4.6 4.8 7488 5    HGB 13.7 13.6 12.1* 12.7*    HCT 40.9 43.1 37* 41    MCV 85.1 87.8  --   --     PLT 186.0 242 305 357     Lipid Panel:  Recent Labs  07/05/15 0744 01/18/16 1516  HDL 34* 34.00*    CBG:  Recent Labs  02/22/16 0902 02/22/16 1314  GLUCAP 124* 126*      Dg Chest 2 View  03/10/2016  CLINICAL DATA:  Shortness of breath. EXAM: CHEST  2 VIEW COMPARISON:  02/10/2016 chest radiograph. FINDINGS: Stable cardiomediastinal silhouette with normal heart size. No pneumothorax. No pleural effusion. Lungs appear clear, with no acute consolidative airspace disease and no pulmonary edema. Stable small eventration of the left hemidiaphragm. IMPRESSION: No active cardiopulmonary disease. Electronically Signed   By: Ilona Sorrel M.D.   On: 03/10/2016 14:32   Ct Angio Chest Pe W/cm &/or Wo Cm  03/10/2016  CLINICAL DATA:  Shortness of breath. Weakness. Recent knee surgery. EXAM: CT ANGIOGRAPHY CHEST WITH  CONTRAST TECHNIQUE: Multidetector CT imaging of the chest was performed using the standard protocol during bolus administration of intravenous contrast. Multiplanar CT image reconstructions and MIPs were obtained to evaluate the vascular anatomy. CONTRAST:  100 cc Isovue 370 IV. COMPARISON:  No prior chest CT. 03/10/2016 chest radiograph. 03/19/2009 CT abdomen/pelvis . FINDINGS: Mediastinum/Nodes: The study is high quality for the evaluation of pulmonary embolism. There are no filling defects in the central, lobar, segmental or subsegmental pulmonary artery branches to suggest acute pulmonary embolism. Mildly atherosclerotic nonaneurysmal thoracic aorta. Normal caliber main pulmonary artery. Normal heart size. No significant pericardial fluid/thickening. Left anterior descending coronary atherosclerosis . No discrete thyroid nodules. Unremarkable esophagus. No pathologically enlarged axillary, mediastinal or hilar lymph nodes. Calcified subcentimeter right paratracheal and bilateral hilar nodes from prior granulomatous disease. Lungs/Pleura: No pneumothorax. No pleural effusion. Apical right upper lobe 0.8 x 0.4 cm (average diameter 0.6 cm) pulmonary nodule (series 5/image 18). Additional apical right upper lobe 0.5 cm pulmonary nodule (series 5/image 33). No acute consolidative airspace disease. Mild centrilobular emphysema. Upper abdomen: Cholecystectomy. Nonobstructing 16 x 9 mm upper left renal stone. Musculoskeletal: No aggressive appearing focal osseous lesions. Moderate thoracic spondylosis. Review of the MIP images confirms the above findings. IMPRESSION: 1. No pulmonary embolism . 2. Apical right upper lobe pulmonary nodules, largest 0.6 cm. Non-contrast chest CT at 3-6 months is recommended. If the nodules are stable at time of repeat CT, then future CT at 18-24 months (from today's scan) is considered optional for low-risk patients, but is recommended for high-risk patients. This recommendation follows  the consensus statement: Guidelines for Management of Incidental Pulmonary Nodules Detected on CT Images:From the Fleischner Society 2017; published online before print (10.1148/radiol.IJ:2314499). 3. Mild centrilobular emphysema. 4. One vessel coronary atherosclerosis.  5. Nonobstructing upper left renal stone. Electronically Signed   By: Ilona Sorrel M.D.   On: 03/10/2016 17:50    ASSESSMENT/PLAN:  Unsteady gait - for Home health PT and OT; fall precaution  Left knee osteoarthritis S/P left total knee arthroplasty - for home health PT and OT; continue Norco 5/325 mg 1-2 tabs by mouth every 4 hours when necessary and Tylenol 500 mg 1 tab by mouth every 6 hours when necessary for pain; Robaxin 500 mg 1 tab by mouth every 6 hours when necessary for muscle spasm; follow-up with orthopedics  Constipation - continue Colace 100 mg 1 capsule by mouth twice a day and MiraLAX 17 g by mouth daily when necessary  BPH - continue Flomax 0.4 mg 1 capsule by mouth daily  GERD - continue omeprazole 20 mg 1 capsule by mouth daily when necessary  Allergic rhinitis - continue Flonase 50 g 2 sprays into both nostrils daily  Leukocytosis - re-checked wbc 8.8; resolved  Renal insufficiency - repeat creatinine 1.05; resolved      I have filled out patient's discharge paperwork and written prescriptions.  Patient will receive home health PT and OT.  DME provided:  none  Total discharge time: Greater than 30 minutes  Discharge time involved coordination of the discharge process with social worker, nursing staff and therapy department. Medical justification for home health services verified.     Durenda Age, NP [employer] [phone]

## 2016-03-10 ENCOUNTER — Emergency Department (HOSPITAL_COMMUNITY): Payer: Medicare Other

## 2016-03-10 ENCOUNTER — Telehealth: Payer: Self-pay | Admitting: Internal Medicine

## 2016-03-10 ENCOUNTER — Encounter (HOSPITAL_COMMUNITY): Payer: Self-pay | Admitting: *Deleted

## 2016-03-10 ENCOUNTER — Emergency Department (HOSPITAL_COMMUNITY)
Admission: EM | Admit: 2016-03-10 | Discharge: 2016-03-10 | Disposition: A | Discharge status: Home / Self Care | Payer: Medicare Other | Attending: Emergency Medicine | Admitting: Emergency Medicine

## 2016-03-10 DIAGNOSIS — Z87891 Personal history of nicotine dependence: Secondary | ICD-10-CM | POA: Insufficient documentation

## 2016-03-10 DIAGNOSIS — Z79899 Other long term (current) drug therapy: Secondary | ICD-10-CM | POA: Insufficient documentation

## 2016-03-10 DIAGNOSIS — Z96652 Presence of left artificial knee joint: Secondary | ICD-10-CM | POA: Insufficient documentation

## 2016-03-10 DIAGNOSIS — E875 Hyperkalemia: Secondary | ICD-10-CM | POA: Diagnosis not present

## 2016-03-10 DIAGNOSIS — R0600 Dyspnea, unspecified: Secondary | ICD-10-CM | POA: Diagnosis not present

## 2016-03-10 DIAGNOSIS — R0602 Shortness of breath: Secondary | ICD-10-CM | POA: Diagnosis not present

## 2016-03-10 DIAGNOSIS — R911 Solitary pulmonary nodule: Secondary | ICD-10-CM | POA: Diagnosis not present

## 2016-03-10 DIAGNOSIS — I2510 Atherosclerotic heart disease of native coronary artery without angina pectoris: Secondary | ICD-10-CM | POA: Insufficient documentation

## 2016-03-10 DIAGNOSIS — Z7982 Long term (current) use of aspirin: Secondary | ICD-10-CM | POA: Insufficient documentation

## 2016-03-10 DIAGNOSIS — E860 Dehydration: Secondary | ICD-10-CM | POA: Diagnosis not present

## 2016-03-10 LAB — BASIC METABOLIC PANEL
ANION GAP: 7 (ref 5–15)
BUN: 22 mg/dL — ABNORMAL HIGH (ref 6–20)
CALCIUM: 10.6 mg/dL — AB (ref 8.9–10.3)
CHLORIDE: 107 mmol/L (ref 101–111)
CO2: 27 mmol/L (ref 22–32)
Creatinine, Ser: 1.32 mg/dL — ABNORMAL HIGH (ref 0.61–1.24)
GFR calc non Af Amer: 52 mL/min — ABNORMAL LOW (ref >60)
Glucose, Bld: 132 mg/dL — ABNORMAL HIGH (ref 65–99)
Potassium: 5.4 mmol/L — ABNORMAL HIGH (ref 3.5–5.1)
SODIUM: 141 mmol/L (ref 135–145)

## 2016-03-10 LAB — CBC
HCT: 39.9 % (ref 39.0–52.0)
HEMOGLOBIN: 12.4 g/dL — AB (ref 13.0–17.0)
MCH: 27.2 pg (ref 26.0–34.0)
MCHC: 31.1 g/dL (ref 30.0–36.0)
MCV: 87.5 fL (ref 78.0–100.0)
Platelets: 331 10*3/uL (ref 150–400)
RBC: 4.56 MIL/uL (ref 4.22–5.81)
RDW: 14 % (ref 11.5–15.5)
WBC: 11.8 10*3/uL — AB (ref 4.0–10.5)

## 2016-03-10 LAB — I-STAT CHEM 8, ED
BUN: 26 mg/dL — ABNORMAL HIGH (ref 6–20)
CALCIUM ION: 1.35 mmol/L — AB (ref 1.13–1.30)
CHLORIDE: 103 mmol/L (ref 101–111)
Creatinine, Ser: 1.2 mg/dL (ref 0.61–1.24)
GLUCOSE: 101 mg/dL — AB (ref 65–99)
HCT: 38 % — ABNORMAL LOW (ref 39.0–52.0)
HEMOGLOBIN: 12.9 g/dL — AB (ref 13.0–17.0)
Potassium: 4.4 mmol/L (ref 3.5–5.1)
SODIUM: 142 mmol/L (ref 135–145)
TCO2: 27 mmol/L (ref 0–100)

## 2016-03-10 LAB — I-STAT TROPONIN, ED
TROPONIN I, POC: 0 ng/mL (ref 0.00–0.08)
TROPONIN I, POC: 0 ng/mL (ref 0.00–0.08)

## 2016-03-10 LAB — BRAIN NATRIURETIC PEPTIDE: B NATRIURETIC PEPTIDE 5: 103.3 pg/mL — AB (ref 0.0–100.0)

## 2016-03-10 LAB — D-DIMER, QUANTITATIVE (NOT AT ARMC): D DIMER QUANT: 10.11 ug{FEU}/mL — AB (ref 0.00–0.50)

## 2016-03-10 MED ORDER — IOPAMIDOL (ISOVUE-370) INJECTION 76%
INTRAVENOUS | Status: AC
  Administered 2016-03-10: 100 mL
  Filled 2016-03-10: qty 100

## 2016-03-10 MED ORDER — SODIUM CHLORIDE 0.9 % IV BOLUS (SEPSIS)
1000.0000 mL | Freq: Once | INTRAVENOUS | Status: AC
  Administered 2016-03-10: 1000 mL via INTRAVENOUS

## 2016-03-10 NOTE — Telephone Encounter (Signed)
New Message   Pt c/o Shortness Of Breath: STAT if SOB developed within the last 24 hours or pt is noticeably SOB on the phone  1. Are you currently SOB (can you hear that pt is SOB on the phone)? No   2. How long have you been experiencing SOB? Pt states about a week  3. Are you SOB when sitting or when up moving around? Pt states Moving around   4. Are you currently experiencing any other symptoms?pt states Lightheadedness, dizziness

## 2016-03-10 NOTE — ED Provider Notes (Signed)
CSN: [id number]     Arrival date & time 03/10/16  1353 History   First MD Initiated Contact with Patient 03/10/16 1604     Chief Complaint  Patient presents with  . Shortness of Breath   HPI Patient presents with concerns of shortness of breath. He had a knee replacement on the left earlier this month and was recently discharged from rehabilitation. He states that since then, he has had shortness breath with exertion but no cough. No active cancer, no history of PE or DVT, does endorse some leg pain and swelling on the left since surgery, denies being on any anticoagulation except aspirin. Patient has been feeling lightheaded this week, has had 6 pound weight loss, states that he stands he feels the room toward dark but does not pass out. He does have a history of CHF with an ejection fraction in the 40s, echo in 2016 but no history of MI. Patient denies any fevers. States he has a chronic cough. His shortness of breath does not reproduce pain with inspiration.  Past Medical History  Diagnosis Date  . DEPRESSION 03/15/2009  . DIVERTICULOSIS, COLON 03/15/2009  . 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 Hx of adenomatous colonic polyps 03/15/2009  . NEPHROLITHIASIS, HX OF 01/28/2010  . Anxiety   . Chronic insomnia   . Fatty liver   . Hyperlipidemia 02/10/2011  . Atrial bigeminy 2008    Dr. Caryl Comes  . Atrial fibrillation (HCC) flutter   . Pneumonia ~ 1950  . Sleep apnea   . [id number])     "monthly" (03/06/2013)  . BPH (benign prostatic hypertrophy) 03/21/2013  . Hx of cardiovascular stress test     ETT-Myoview (9/15):  Inferior infarct, no ischemia, EF 41% - Intermediate Risk  . CAD (coronary artery disease)     LHC (10/15):  pLAD 20%, mCFX 20%, pRCA 20%, PDA 20%, EF 40%  . NICM (nonischemic cardiomyopathy) ([place])     Echo (10/15):  Mild LVH, EF 35-40%, diff HK  . GERD 03/15/2009    no medications now  . Dysrhythmia     seen by Dr Caryl Comes    Past Surgical History  Procedure Laterality  Date  . Appendectomy    . Cholecystectomy  2006  . Tonsillectomy and adenoidectomy  ~ 1952  . Inguinal hernia repair Right 1986  . Cardioversion N/A 02/06/2013    Procedure: CARDIOVERSION;  Surgeon: Evans Lance, MD;  Location: [place];  Service: Cardiovascular;  Laterality: N/A;  . Tee without cardioversion N/A 03/05/2013    Procedure: TRANSESOPHAGEAL ECHOCARDIOGRAM (TEE);  Surgeon: Larey Dresser, MD;  Location: [place];  Service: Cardiovascular;  Laterality: N/A;  Jeannene Patella Greggory Brandy  . Atrial flutter ablation  03/06/2013  . Atrial flutter ablation N/A 03/06/2013    Procedure: ATRIAL FLUTTER ABLATION;  Surgeon: Deboraha Sprang, MD;  Location: [facility] CATH LAB;  Service: Cardiovascular;  Laterality: N/A;  . Left heart catheterization with coronary angiogram N/A 06/19/2014    Procedure: LEFT HEART CATHETERIZATION WITH CORONARY ANGIOGRAM;  Surgeon: Burnell Blanks, MD;  Location: [facility] CATH LAB;  Service: Cardiovascular;  Laterality: N/A;  . Total knee arthroplasty Left 02/22/2016    Procedure: TOTAL KNEE ARTHROPLASTY;  Surgeon: Melrose Nakayama, MD;  Location: [place];  Service: Orthopedics;  Laterality: Left;   Family History  Problem Relation Age of Onset  . Lung cancer Mother 25  . Colon cancer Mother   . CAD Neg Hx   . Heart attack Neg Hx   . Stroke  Neg Hx   . Cancer Mother   . Cancer Maternal Grandmother   . Diabetes Neg Hx    Social History  Substance Use Topics  . Smoking status: Former Smoker -- 1.00 packs/day for 13 years    Types: Cigarettes    Quit date: 12/25/1979  . Smokeless tobacco: Never Used  . Alcohol Use: 0.6 oz/week    1 Cans of beer per week     Comment: 03/06/2013 "glass of wine maybe once a month; glass of beer/wk"    Review of Systems  Constitutional: Negative for fever.  Allergic/Immunologic: Negative for immunocompromised state.  All other systems reviewed and are negative.     Allergies  Lisinopril; Xarelto; and Lipitor  Home Medications   Prior to  Admission medications   Medication Sig Start Date End Date Taking? Authorizing Provider  acetaminophen (TYLENOL) 500 MG tablet Take 500 mg by mouth every 6 (six) hours as needed for pain.   Yes Historical Provider, MD  albuterol (PROVENTIL HFA;VENTOLIN HFA) 108 (90 BASE) MCG/ACT inhaler Inhale 2 puffs into the lungs every 6 (six) hours as needed for wheezing or shortness of breath. 05/29/14  Yes Biagio Borg, MD  aspirin 81 MG tablet Take 81 mg by mouth daily.   Yes Historical Provider, MD  aspirin EC 325 MG EC tablet Take 1 tablet (325 mg total) by mouth 2 (two) times daily after a meal. 02/24/16  Yes Loni Dolly, PA-C  fluticasone (FLONASE) 50 MCG/ACT nasal spray Place 2 sprays into both nostrils daily.   Yes Historical Provider, MD  HYDROcodone-acetaminophen (NORCO/VICODIN) 5-325 MG tablet Take 1-2 tablets by mouth every 4 (four) hours as needed for moderate pain.    Yes Historical Provider, MD  ibuprofen (ADVIL,MOTRIN) 200 MG tablet Take 600 mg by mouth every 6 (six) hours as needed for moderate pain.   Yes Historical Provider, MD  methocarbamol (ROBAXIN) 500 MG tablet Take 1 tablet (500 mg total) by mouth every 6 (six) hours as needed for muscle spasms. 02/24/16  Yes Loni Dolly, PA-C  nitroGLYCERIN (NITROSTAT) 0.4 MG SL tablet Place 1 tablet (0.4 mg total) under the tongue every 5 (five) minutes as needed for chest pain. 11/09/14  Yes Deboraha Sprang, MD  omeprazole (PRILOSEC) 20 MG capsule Take 20 mg by mouth daily as needed.   Yes Historical Provider, MD  polyethylene glycol (MIRALAX / GLYCOLAX) packet Take 17 g by mouth daily as needed for mild constipation.   Yes Historical Provider, MD  tamsulosin (FLOMAX) 0.4 MG CAPS capsule Take 1 capsule (0.4 mg total) by mouth daily. 04/14/15  Yes Biagio Borg, MD   BP 136/85 mmHg  Pulse 78  Temp(Src) 98.7 F (37.1 C) (Oral)  Resp 12  Ht 6\' 5"  (1.956 m)  Wt 113.399 kg  BMI 29.64 kg/m2  SpO2 100% Physical Exam  Constitutional: He appears well-developed  and well-nourished. No distress.  HENT:  Head: Normocephalic and atraumatic.  Left Ear: External ear normal.  Eyes: Conjunctivae are normal. Pupils are equal, round, and reactive to light. Right eye exhibits no discharge. Left eye exhibits no discharge.  Neck: Normal range of motion. Neck supple.  Cardiovascular: Normal rate, regular rhythm and intact distal pulses.   No murmur heard. Pulmonary/Chest: Effort normal and breath sounds normal. No respiratory distress. He has no wheezes. He has no rales. He exhibits no tenderness.  Abdominal: Soft. Bowel sounds are normal. He exhibits no distension and no mass. There is no tenderness. There is no rebound  and no guarding.  Musculoskeletal: He exhibits edema (Left knee with bandage in place with no surrounding erythema, 2+ dorsalis pedis and swelling of the left lower extremity with bruising along the thigh and calf and ankle. Tenderness to palpation of the calf. Swelling when compared to the right.).  Neurological: He is alert.  Skin: Skin is warm. He is not diaphoretic.  Psychiatric: He has a normal mood and affect.    ED Course  Procedures (including critical care time) Labs Review Labs Reviewed  BASIC METABOLIC PANEL - Abnormal; Notable for the following:    Potassium 5.4 (*)    Glucose, Bld 132 (*)    BUN 22 (*)    Creatinine, Ser 1.32 (*)    Calcium 10.6 (*)    GFR calc non Af Amer 52 (*)    All other components within normal limits  CBC - Abnormal; Notable for the following:    WBC 11.8 (*)    Hemoglobin 12.4 (*)    All other components within normal limits  D-DIMER, QUANTITATIVE (NOT AT [facility]) - Abnormal; Notable for the following:    D-Dimer, Quant 10.11 (*)    All other components within normal limits  BRAIN NATRIURETIC PEPTIDE - Abnormal; Notable for the following:    B Natriuretic Peptide 103.3 (*)    All other components within normal limits  I-STAT CHEM 8, ED - Abnormal; Notable for the following:    BUN 26 (*)     Glucose, Bld 101 (*)    Calcium, Ion 1.35 (*)    Hemoglobin 12.9 (*)    HCT 38.0 (*)    All other components within normal limits  I-STAT TROPOININ, ED  I-STAT TROPOININ, ED  I-STAT CHEM 8, ED    Imaging Review Dg Chest 2 View  03/10/2016  CLINICAL DATA:  Shortness of breath. EXAM: CHEST  2 VIEW COMPARISON:  02/10/2016 chest radiograph. FINDINGS: Stable cardiomediastinal silhouette with normal heart size. No pneumothorax. No pleural effusion. Lungs appear clear, with no acute consolidative airspace disease and no pulmonary edema. Stable small eventration of the left hemidiaphragm. IMPRESSION: No active cardiopulmonary disease. Electronically Signed   By: Ilona Sorrel M.D.   On: 03/10/2016 14:32   Ct Angio Chest Pe W/cm &/or Wo Cm  03/10/2016  CLINICAL DATA:  Shortness of breath. Weakness. Recent knee surgery. EXAM: CT ANGIOGRAPHY CHEST WITH CONTRAST TECHNIQUE: Multidetector CT imaging of the chest was performed using the standard protocol during bolus administration of intravenous contrast. Multiplanar CT image reconstructions and MIPs were obtained to evaluate the vascular anatomy. CONTRAST:  100 cc Isovue 370 IV. COMPARISON:  No prior chest CT. 03/10/2016 chest radiograph. 03/19/2009 CT abdomen/pelvis . FINDINGS: Mediastinum/Nodes: The study is high quality for the evaluation of pulmonary embolism. There are no filling defects in the central, lobar, segmental or subsegmental pulmonary artery branches to suggest acute pulmonary embolism. Mildly atherosclerotic nonaneurysmal thoracic aorta. Normal caliber main pulmonary artery. Normal heart size. No significant pericardial fluid/thickening. Left anterior descending coronary atherosclerosis . No discrete thyroid nodules. Unremarkable esophagus. No pathologically enlarged axillary, mediastinal or hilar lymph nodes. Calcified subcentimeter right paratracheal and bilateral hilar nodes from prior granulomatous disease. Lungs/Pleura: No pneumothorax. No  pleural effusion. Apical right upper lobe 0.8 x 0.4 cm (average diameter 0.6 cm) pulmonary nodule (series 5/image 18). Additional apical right upper lobe 0.5 cm pulmonary nodule (series 5/image 33). No acute consolidative airspace disease. Mild centrilobular emphysema. Upper abdomen: Cholecystectomy. Nonobstructing 16 x 9 mm upper left renal stone. Musculoskeletal: No  aggressive appearing focal osseous lesions. Moderate thoracic spondylosis. Review of the MIP images confirms the above findings. IMPRESSION: 1. No pulmonary embolism . 2. Apical right upper lobe pulmonary nodules, largest 0.6 cm. Non-contrast chest CT at 3-6 months is recommended. If the nodules are stable at time of repeat CT, then future CT at 18-24 months (from today's scan) is considered optional for low-risk patients, but is recommended for high-risk patients. This recommendation follows the consensus statement: Guidelines for Management of Incidental Pulmonary Nodules Detected on CT Images:From the Fleischner Society 2017; published online before print (10.1148/radiol.IJ:2314499). 3. Mild centrilobular emphysema. 4. One vessel coronary atherosclerosis. 5. Nonobstructing upper left renal stone. Electronically Signed   By: Ilona Sorrel M.D.   On: 03/10/2016 17:50   I have personally reviewed and evaluated these images and lab results as part of my medical decision-making.   EKG Interpretation None     Normal sinus rhythm, normal intervals, no ST elevation or ST depression, no bundle branch blocks although does have RBBB morphology in lead aVL, no new T-wave inversions; Q waves in lead aVR.   MDM   Final diagnoses:  Dyspnea  Dehydration   EKG without signs of hyperkalemia, acute MI. Troponin is negativex2. Chest x-ray without evidence of pulmonary edema, focal consolidation. Minimal leukocytosis. Doubt dissection given no chest pain. BMP ordered even history of CHF although patient is not volume overloaded on exam. Doubt aortic  stenosis as sourcepatient denies history of such and none significant on prior echo. Patient however does have mild hyperkalemia, increase in his creatinine from baseline. D-dimer is highly elevated with concerning exam for PE and DVT. CTPA negative for pulmonary embolism. She does have a pulmonary nodule which he was instructed to follow-up with his primary care provider in 3 months for a reevaluation. He does have a history of remote smoking. Hyperkalemia resolved after fluid bolus as did orthostatics. Discussed results with patient and need for likely stress test. Patient is safe for outpatient management of this and will follow-up with his cardiologist next week.    Karma Greaser, MD 03/10/16 2019  Elnora Morrison, MD 03/11/16 [phone]

## 2016-03-10 NOTE — ED Notes (Signed)
MD at bedside. 

## 2016-03-10 NOTE — ED Notes (Signed)
Pt reports L knee replacement at the beginning of this month & was released from PT earlier this week, since pt c/o dizziness worse with standing & movement & SOB, pt sent here by cardiologist for r/o blood clot,  Pt denies CP, SOB, n/v/d, pt A&O x 4

## 2016-03-10 NOTE — Telephone Encounter (Signed)
I called and spoke with the patient. He states he had knee surgery on 02/22/16 and just got home from rehab on 03/08/16. He is complaining of SOB with standing and some lightheadedness. He denies chest pain or heart palpitations at this time. He denies that he is currently taking any blood thinner, but is taking ASA 325 mg once daily. He has not checked his BP at home when lightheaded. He is due for in-home PT.   Discussed with Truitt Merle, NP. Patient needs to go to the ER for further evaluation and r/o PE. I have advised the patient of this. He is agreeable with reporting to the [facility] ER. I have notified the triage nurse that he should be coming.

## 2016-03-11 DIAGNOSIS — M1711 Unilateral primary osteoarthritis, right knee: Secondary | ICD-10-CM | POA: Diagnosis not present

## 2016-03-11 DIAGNOSIS — N289 Disorder of kidney and ureter, unspecified: Secondary | ICD-10-CM | POA: Diagnosis not present

## 2016-03-11 DIAGNOSIS — N400 Enlarged prostate without lower urinary tract symptoms: Secondary | ICD-10-CM | POA: Diagnosis not present

## 2016-03-11 DIAGNOSIS — I429 Cardiomyopathy, unspecified: Secondary | ICD-10-CM | POA: Diagnosis not present

## 2016-03-11 DIAGNOSIS — I4892 Unspecified atrial flutter: Secondary | ICD-10-CM | POA: Diagnosis not present

## 2016-03-11 DIAGNOSIS — E119 Type 2 diabetes mellitus without complications: Secondary | ICD-10-CM | POA: Diagnosis not present

## 2016-03-11 DIAGNOSIS — Z471 Aftercare following joint replacement surgery: Secondary | ICD-10-CM | POA: Diagnosis not present

## 2016-03-11 DIAGNOSIS — G4730 Sleep apnea, unspecified: Secondary | ICD-10-CM | POA: Diagnosis not present

## 2016-03-11 DIAGNOSIS — M109 Gout, unspecified: Secondary | ICD-10-CM | POA: Diagnosis not present

## 2016-03-11 DIAGNOSIS — I4891 Unspecified atrial fibrillation: Secondary | ICD-10-CM | POA: Diagnosis not present

## 2016-03-11 DIAGNOSIS — I2510 Atherosclerotic heart disease of native coronary artery without angina pectoris: Secondary | ICD-10-CM | POA: Diagnosis not present

## 2016-03-14 ENCOUNTER — Telehealth: Payer: Self-pay | Admitting: Internal Medicine

## 2016-03-14 DIAGNOSIS — M1711 Unilateral primary osteoarthritis, right knee: Secondary | ICD-10-CM | POA: Diagnosis not present

## 2016-03-14 DIAGNOSIS — I4892 Unspecified atrial flutter: Secondary | ICD-10-CM | POA: Diagnosis not present

## 2016-03-14 DIAGNOSIS — I429 Cardiomyopathy, unspecified: Secondary | ICD-10-CM | POA: Diagnosis not present

## 2016-03-14 DIAGNOSIS — I4891 Unspecified atrial fibrillation: Secondary | ICD-10-CM | POA: Diagnosis not present

## 2016-03-14 DIAGNOSIS — M109 Gout, unspecified: Secondary | ICD-10-CM | POA: Diagnosis not present

## 2016-03-14 DIAGNOSIS — N400 Enlarged prostate without lower urinary tract symptoms: Secondary | ICD-10-CM | POA: Diagnosis not present

## 2016-03-14 DIAGNOSIS — G4730 Sleep apnea, unspecified: Secondary | ICD-10-CM | POA: Diagnosis not present

## 2016-03-14 DIAGNOSIS — I2510 Atherosclerotic heart disease of native coronary artery without angina pectoris: Secondary | ICD-10-CM | POA: Diagnosis not present

## 2016-03-14 DIAGNOSIS — E119 Type 2 diabetes mellitus without complications: Secondary | ICD-10-CM | POA: Diagnosis not present

## 2016-03-14 DIAGNOSIS — Z471 Aftercare following joint replacement surgery: Secondary | ICD-10-CM | POA: Diagnosis not present

## 2016-03-14 DIAGNOSIS — N289 Disorder of kidney and ureter, unspecified: Secondary | ICD-10-CM | POA: Diagnosis not present

## 2016-03-14 NOTE — Telephone Encounter (Signed)
New Message   Pt call requesting to speak to RN. Pt stated he went to the hospital for sob and dizziness. Pt states a CT scan was complete. Pt wants to know if provider could look over the results to see if pt needs to be seen soon. Please call back to discuss

## 2016-03-14 NOTE — Telephone Encounter (Signed)
Will forward to Dr. Klein to review. 

## 2016-03-15 DIAGNOSIS — N289 Disorder of kidney and ureter, unspecified: Secondary | ICD-10-CM | POA: Diagnosis not present

## 2016-03-15 DIAGNOSIS — I429 Cardiomyopathy, unspecified: Secondary | ICD-10-CM | POA: Diagnosis not present

## 2016-03-15 DIAGNOSIS — I4892 Unspecified atrial flutter: Secondary | ICD-10-CM | POA: Diagnosis not present

## 2016-03-15 DIAGNOSIS — Z471 Aftercare following joint replacement surgery: Secondary | ICD-10-CM | POA: Diagnosis not present

## 2016-03-15 DIAGNOSIS — I4891 Unspecified atrial fibrillation: Secondary | ICD-10-CM | POA: Diagnosis not present

## 2016-03-15 DIAGNOSIS — G4730 Sleep apnea, unspecified: Secondary | ICD-10-CM | POA: Diagnosis not present

## 2016-03-15 DIAGNOSIS — E119 Type 2 diabetes mellitus without complications: Secondary | ICD-10-CM | POA: Diagnosis not present

## 2016-03-15 DIAGNOSIS — I2510 Atherosclerotic heart disease of native coronary artery without angina pectoris: Secondary | ICD-10-CM | POA: Diagnosis not present

## 2016-03-15 DIAGNOSIS — M1711 Unilateral primary osteoarthritis, right knee: Secondary | ICD-10-CM | POA: Diagnosis not present

## 2016-03-15 DIAGNOSIS — N400 Enlarged prostate without lower urinary tract symptoms: Secondary | ICD-10-CM | POA: Diagnosis not present

## 2016-03-15 DIAGNOSIS — M109 Gout, unspecified: Secondary | ICD-10-CM | POA: Diagnosis not present

## 2016-03-17 DIAGNOSIS — I4891 Unspecified atrial fibrillation: Secondary | ICD-10-CM | POA: Diagnosis not present

## 2016-03-17 DIAGNOSIS — I429 Cardiomyopathy, unspecified: Secondary | ICD-10-CM | POA: Diagnosis not present

## 2016-03-17 DIAGNOSIS — E119 Type 2 diabetes mellitus without complications: Secondary | ICD-10-CM | POA: Diagnosis not present

## 2016-03-17 DIAGNOSIS — Z471 Aftercare following joint replacement surgery: Secondary | ICD-10-CM | POA: Diagnosis not present

## 2016-03-17 DIAGNOSIS — I4892 Unspecified atrial flutter: Secondary | ICD-10-CM | POA: Diagnosis not present

## 2016-03-17 DIAGNOSIS — N289 Disorder of kidney and ureter, unspecified: Secondary | ICD-10-CM | POA: Diagnosis not present

## 2016-03-17 DIAGNOSIS — N400 Enlarged prostate without lower urinary tract symptoms: Secondary | ICD-10-CM | POA: Diagnosis not present

## 2016-03-17 DIAGNOSIS — M109 Gout, unspecified: Secondary | ICD-10-CM | POA: Diagnosis not present

## 2016-03-17 DIAGNOSIS — I2510 Atherosclerotic heart disease of native coronary artery without angina pectoris: Secondary | ICD-10-CM | POA: Diagnosis not present

## 2016-03-17 DIAGNOSIS — G4730 Sleep apnea, unspecified: Secondary | ICD-10-CM | POA: Diagnosis not present

## 2016-03-17 DIAGNOSIS — M1711 Unilateral primary osteoarthritis, right knee: Secondary | ICD-10-CM | POA: Diagnosis not present

## 2016-03-17 NOTE — Telephone Encounter (Signed)
Dr. Caryl Comes called and spoke with the patient.

## 2016-03-20 DIAGNOSIS — M109 Gout, unspecified: Secondary | ICD-10-CM | POA: Diagnosis not present

## 2016-03-20 DIAGNOSIS — M1711 Unilateral primary osteoarthritis, right knee: Secondary | ICD-10-CM | POA: Diagnosis not present

## 2016-03-20 DIAGNOSIS — G4730 Sleep apnea, unspecified: Secondary | ICD-10-CM | POA: Diagnosis not present

## 2016-03-20 DIAGNOSIS — I2510 Atherosclerotic heart disease of native coronary artery without angina pectoris: Secondary | ICD-10-CM | POA: Diagnosis not present

## 2016-03-20 DIAGNOSIS — I4891 Unspecified atrial fibrillation: Secondary | ICD-10-CM | POA: Diagnosis not present

## 2016-03-20 DIAGNOSIS — I429 Cardiomyopathy, unspecified: Secondary | ICD-10-CM | POA: Diagnosis not present

## 2016-03-20 DIAGNOSIS — I4892 Unspecified atrial flutter: Secondary | ICD-10-CM | POA: Diagnosis not present

## 2016-03-20 DIAGNOSIS — N289 Disorder of kidney and ureter, unspecified: Secondary | ICD-10-CM | POA: Diagnosis not present

## 2016-03-20 DIAGNOSIS — N400 Enlarged prostate without lower urinary tract symptoms: Secondary | ICD-10-CM | POA: Diagnosis not present

## 2016-03-20 DIAGNOSIS — Z471 Aftercare following joint replacement surgery: Secondary | ICD-10-CM | POA: Diagnosis not present

## 2016-03-20 DIAGNOSIS — Z96659 Presence of unspecified artificial knee joint: Secondary | ICD-10-CM | POA: Diagnosis not present

## 2016-03-20 DIAGNOSIS — E119 Type 2 diabetes mellitus without complications: Secondary | ICD-10-CM | POA: Diagnosis not present

## 2016-03-22 DIAGNOSIS — Z471 Aftercare following joint replacement surgery: Secondary | ICD-10-CM | POA: Diagnosis not present

## 2016-03-22 DIAGNOSIS — I2510 Atherosclerotic heart disease of native coronary artery without angina pectoris: Secondary | ICD-10-CM | POA: Diagnosis not present

## 2016-03-22 DIAGNOSIS — M109 Gout, unspecified: Secondary | ICD-10-CM | POA: Diagnosis not present

## 2016-03-22 DIAGNOSIS — N400 Enlarged prostate without lower urinary tract symptoms: Secondary | ICD-10-CM | POA: Diagnosis not present

## 2016-03-22 DIAGNOSIS — N289 Disorder of kidney and ureter, unspecified: Secondary | ICD-10-CM | POA: Diagnosis not present

## 2016-03-22 DIAGNOSIS — I4892 Unspecified atrial flutter: Secondary | ICD-10-CM | POA: Diagnosis not present

## 2016-03-22 DIAGNOSIS — G4730 Sleep apnea, unspecified: Secondary | ICD-10-CM | POA: Diagnosis not present

## 2016-03-22 DIAGNOSIS — M1711 Unilateral primary osteoarthritis, right knee: Secondary | ICD-10-CM | POA: Diagnosis not present

## 2016-03-22 DIAGNOSIS — E119 Type 2 diabetes mellitus without complications: Secondary | ICD-10-CM | POA: Diagnosis not present

## 2016-03-22 DIAGNOSIS — I4891 Unspecified atrial fibrillation: Secondary | ICD-10-CM | POA: Diagnosis not present

## 2016-03-22 DIAGNOSIS — I429 Cardiomyopathy, unspecified: Secondary | ICD-10-CM | POA: Diagnosis not present

## 2016-03-24 DIAGNOSIS — M1711 Unilateral primary osteoarthritis, right knee: Secondary | ICD-10-CM | POA: Diagnosis not present

## 2016-03-24 DIAGNOSIS — M109 Gout, unspecified: Secondary | ICD-10-CM | POA: Diagnosis not present

## 2016-03-24 DIAGNOSIS — N289 Disorder of kidney and ureter, unspecified: Secondary | ICD-10-CM | POA: Diagnosis not present

## 2016-03-24 DIAGNOSIS — N400 Enlarged prostate without lower urinary tract symptoms: Secondary | ICD-10-CM | POA: Diagnosis not present

## 2016-03-24 DIAGNOSIS — I2510 Atherosclerotic heart disease of native coronary artery without angina pectoris: Secondary | ICD-10-CM | POA: Diagnosis not present

## 2016-03-24 DIAGNOSIS — E119 Type 2 diabetes mellitus without complications: Secondary | ICD-10-CM | POA: Diagnosis not present

## 2016-03-24 DIAGNOSIS — G4730 Sleep apnea, unspecified: Secondary | ICD-10-CM | POA: Diagnosis not present

## 2016-03-24 DIAGNOSIS — I4891 Unspecified atrial fibrillation: Secondary | ICD-10-CM | POA: Diagnosis not present

## 2016-03-24 DIAGNOSIS — I429 Cardiomyopathy, unspecified: Secondary | ICD-10-CM | POA: Diagnosis not present

## 2016-03-24 DIAGNOSIS — I4892 Unspecified atrial flutter: Secondary | ICD-10-CM | POA: Diagnosis not present

## 2016-03-24 DIAGNOSIS — Z471 Aftercare following joint replacement surgery: Secondary | ICD-10-CM | POA: Diagnosis not present

## 2016-04-03 DIAGNOSIS — M25562 Pain in left knee: Secondary | ICD-10-CM | POA: Diagnosis not present

## 2016-04-03 DIAGNOSIS — Z96652 Presence of left artificial knee joint: Secondary | ICD-10-CM | POA: Diagnosis not present

## 2016-04-03 DIAGNOSIS — M25662 Stiffness of left knee, not elsewhere classified: Secondary | ICD-10-CM | POA: Diagnosis not present

## 2016-04-05 DIAGNOSIS — M25662 Stiffness of left knee, not elsewhere classified: Secondary | ICD-10-CM | POA: Diagnosis not present

## 2016-04-05 DIAGNOSIS — Z96652 Presence of left artificial knee joint: Secondary | ICD-10-CM | POA: Diagnosis not present

## 2016-04-05 DIAGNOSIS — M25562 Pain in left knee: Secondary | ICD-10-CM | POA: Diagnosis not present

## 2016-04-11 DIAGNOSIS — M25662 Stiffness of left knee, not elsewhere classified: Secondary | ICD-10-CM | POA: Diagnosis not present

## 2016-04-11 DIAGNOSIS — Z96652 Presence of left artificial knee joint: Secondary | ICD-10-CM | POA: Diagnosis not present

## 2016-04-11 DIAGNOSIS — M25562 Pain in left knee: Secondary | ICD-10-CM | POA: Diagnosis not present

## 2016-04-14 DIAGNOSIS — M25562 Pain in left knee: Secondary | ICD-10-CM | POA: Diagnosis not present

## 2016-04-14 DIAGNOSIS — M25662 Stiffness of left knee, not elsewhere classified: Secondary | ICD-10-CM | POA: Diagnosis not present

## 2016-04-14 DIAGNOSIS — Z96652 Presence of left artificial knee joint: Secondary | ICD-10-CM | POA: Diagnosis not present

## 2016-04-17 DIAGNOSIS — Z96652 Presence of left artificial knee joint: Secondary | ICD-10-CM | POA: Diagnosis not present

## 2016-04-17 DIAGNOSIS — M25562 Pain in left knee: Secondary | ICD-10-CM | POA: Diagnosis not present

## 2016-04-17 DIAGNOSIS — M25662 Stiffness of left knee, not elsewhere classified: Secondary | ICD-10-CM | POA: Diagnosis not present

## 2016-04-20 DIAGNOSIS — Z96652 Presence of left artificial knee joint: Secondary | ICD-10-CM | POA: Diagnosis not present

## 2016-04-20 DIAGNOSIS — M25562 Pain in left knee: Secondary | ICD-10-CM | POA: Diagnosis not present

## 2016-04-20 DIAGNOSIS — M25662 Stiffness of left knee, not elsewhere classified: Secondary | ICD-10-CM | POA: Diagnosis not present

## 2016-04-24 ENCOUNTER — Other Ambulatory Visit: Payer: Self-pay | Admitting: Internal Medicine

## 2016-04-24 DIAGNOSIS — M25562 Pain in left knee: Secondary | ICD-10-CM | POA: Diagnosis not present

## 2016-04-24 DIAGNOSIS — Z96652 Presence of left artificial knee joint: Secondary | ICD-10-CM | POA: Diagnosis not present

## 2016-04-24 DIAGNOSIS — M25662 Stiffness of left knee, not elsewhere classified: Secondary | ICD-10-CM | POA: Diagnosis not present

## 2016-05-03 DIAGNOSIS — Z96652 Presence of left artificial knee joint: Secondary | ICD-10-CM | POA: Diagnosis not present

## 2016-05-03 DIAGNOSIS — M25562 Pain in left knee: Secondary | ICD-10-CM | POA: Diagnosis not present

## 2016-05-03 DIAGNOSIS — M25662 Stiffness of left knee, not elsewhere classified: Secondary | ICD-10-CM | POA: Diagnosis not present

## 2016-05-05 DIAGNOSIS — M25662 Stiffness of left knee, not elsewhere classified: Secondary | ICD-10-CM | POA: Diagnosis not present

## 2016-05-05 DIAGNOSIS — Z96652 Presence of left artificial knee joint: Secondary | ICD-10-CM | POA: Diagnosis not present

## 2016-05-05 DIAGNOSIS — M25562 Pain in left knee: Secondary | ICD-10-CM | POA: Diagnosis not present

## 2016-05-08 DIAGNOSIS — M25662 Stiffness of left knee, not elsewhere classified: Secondary | ICD-10-CM | POA: Diagnosis not present

## 2016-05-08 DIAGNOSIS — Z96652 Presence of left artificial knee joint: Secondary | ICD-10-CM | POA: Diagnosis not present

## 2016-05-08 DIAGNOSIS — M25562 Pain in left knee: Secondary | ICD-10-CM | POA: Diagnosis not present

## 2016-05-10 DIAGNOSIS — M25562 Pain in left knee: Secondary | ICD-10-CM | POA: Diagnosis not present

## 2016-05-10 DIAGNOSIS — M25652 Stiffness of left hip, not elsewhere classified: Secondary | ICD-10-CM | POA: Diagnosis not present

## 2016-05-10 DIAGNOSIS — M25662 Stiffness of left knee, not elsewhere classified: Secondary | ICD-10-CM | POA: Diagnosis not present

## 2016-05-10 DIAGNOSIS — Z96652 Presence of left artificial knee joint: Secondary | ICD-10-CM | POA: Diagnosis not present

## 2016-05-17 DIAGNOSIS — M25562 Pain in left knee: Secondary | ICD-10-CM | POA: Diagnosis not present

## 2016-05-17 DIAGNOSIS — Z96652 Presence of left artificial knee joint: Secondary | ICD-10-CM | POA: Diagnosis not present

## 2016-05-17 DIAGNOSIS — M25662 Stiffness of left knee, not elsewhere classified: Secondary | ICD-10-CM | POA: Diagnosis not present

## 2016-06-02 ENCOUNTER — Ambulatory Visit (INDEPENDENT_AMBULATORY_CARE_PROVIDER_SITE_OTHER): Payer: Medicare Other | Admitting: Internal Medicine

## 2016-06-02 ENCOUNTER — Other Ambulatory Visit (INDEPENDENT_AMBULATORY_CARE_PROVIDER_SITE_OTHER): Payer: Medicare Other

## 2016-06-02 ENCOUNTER — Encounter: Payer: Self-pay | Admitting: Internal Medicine

## 2016-06-02 VITALS — BP 122/80 | HR 72 | Temp 98.6°F | Wt 251.0 lb

## 2016-06-02 DIAGNOSIS — M545 Low back pain, unspecified: Secondary | ICD-10-CM

## 2016-06-02 DIAGNOSIS — F411 Generalized anxiety disorder: Secondary | ICD-10-CM | POA: Diagnosis not present

## 2016-06-02 DIAGNOSIS — E119 Type 2 diabetes mellitus without complications: Secondary | ICD-10-CM | POA: Diagnosis not present

## 2016-06-02 LAB — URINALYSIS, ROUTINE W REFLEX MICROSCOPIC
BILIRUBIN URINE: NEGATIVE
KETONES UR: NEGATIVE
LEUKOCYTES UA: NEGATIVE
NITRITE: NEGATIVE
PH: 5.5 (ref 5.0–8.0)
Specific Gravity, Urine: 1.025 (ref 1.000–1.030)
TOTAL PROTEIN, URINE-UPE24: NEGATIVE
URINE GLUCOSE: NEGATIVE
UROBILINOGEN UA: 0.2 (ref 0.0–1.0)

## 2016-06-02 NOTE — Progress Notes (Signed)
Pre visit review using our clinic review tool, if applicable. No additional management support is needed unless otherwise documented below in the visit note. 

## 2016-06-02 NOTE — Progress Notes (Signed)
Subjective:    Patient ID: Dennis Hill, male    DOB: 12/03/1945, 70 y.o.   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  Some radiaiton to the right, no bowel or bladder change, fever, wt loss,  worsening LE pain/numbness/weakness, gait change or falls. Is s/p left knee replacement, has been walking differently, as to push off to stand up due to persistent left knee pai, and some muscle quad pain on the right as well.  Has also lifted a few heavy boxes.  Denies urinary symptoms such as dysuria, frequency, urgency, flank pain, hematuria or n/v, fever, chills.  Denies worsening reflux, abd pain, dysphagia, n/v, or blood, but has had some constipation recently postop with pain med and more driving recent.  Denies worsening depressive symptoms, suicidal ideation, or panic.    Pt denies polydipsia, polyuria,.  Pt states overall good compliance with meds  Past Medical History:  Diagnosis Date  . Anxiety   . Atrial bigeminy 2008   Dr. Caryl Comes  . Atrial fibrillation (HCC) flutter   . BPH (benign prostatic hypertrophy) 03/21/2013  . CAD (coronary artery disease)    LHC (10/15):  pLAD 20%, mCFX 20%, pRCA 20%, PDA 20%, EF 40%  . Chronic insomnia   . DEPRESSION 03/15/2009  . DIVERTICULOSIS, COLON 03/15/2009  . Dysrhythmia    seen by Dr Caryl Comes   . 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 Fatty liver   . GERD 03/15/2009   no medications now  . [id number])    "monthly" (03/06/2013)  . Hx of adenomatous colonic polyps 03/15/2009  . Hx of cardiovascular stress test    ETT-Myoview (9/15):  Inferior infarct, no ischemia, EF 41% - Intermediate Risk  . Hyperlipidemia 02/10/2011  . NEPHROLITHIASIS, HX OF 01/28/2010  . NICM (nonischemic cardiomyopathy) ([place])    Echo (10/15):  Mild LVH, EF 35-40%, diff HK  . Pneumonia ~ 1950   . Sleep apnea    Past Surgical History:  Procedure Laterality Date  . APPENDECTOMY    . ATRIAL FLUTTER ABLATION  03/06/2013  . ATRIAL FLUTTER ABLATION N/A 03/06/2013   Procedure: ATRIAL FLUTTER ABLATION;  Surgeon: Deboraha Sprang, MD;  Location: [facility] CATH LAB;  Service: Cardiovascular;  Laterality: N/A;  . CARDIOVERSION N/A 02/06/2013   Procedure: CARDIOVERSION;  Surgeon: Evans Lance, MD;  Location: [place];  Service: Cardiovascular;  Laterality: N/A;  . CHOLECYSTECTOMY  2006  . INGUINAL HERNIA REPAIR Right 1986  . LEFT HEART CATHETERIZATION WITH CORONARY ANGIOGRAM N/A 06/19/2014   Procedure: LEFT HEART CATHETERIZATION WITH CORONARY ANGIOGRAM;  Surgeon: Burnell Blanks, MD;  Location: [facility] CATH LAB;  Service: Cardiovascular;  Laterality: N/A;  . TEE WITHOUT CARDIOVERSION N/A 03/05/2013   Procedure: TRANSESOPHAGEAL ECHOCARDIOGRAM (TEE);  Surgeon: Larey Dresser, MD;  Location: [place];  Service: Cardiovascular;  Laterality: N/A;  Jeannene Patella Greggory Brandy  . TONSILLECTOMY AND ADENOIDECTOMY  ~ 1952  . TOTAL KNEE ARTHROPLASTY Left 02/22/2016   Procedure: TOTAL KNEE ARTHROPLASTY;  Surgeon: Melrose Nakayama, MD;  Location: [place];  Service: Orthopedics;  Laterality: Left;    reports that he quit smoking about 36 years ago. His smoking use included Cigarettes. He has a 13.00 pack-year smoking history. He has never used smokeless tobacco. He reports that he drinks about 0.6 oz of  alcohol per week . He reports that he does not use drugs. family history includes Cancer in his maternal grandmother and mother; Colon cancer in his mother; Lung cancer (age of onset: 58) in his mother. Allergies  Allergen Reactions  . Lisinopril Swelling    Tongue swelling  . Xarelto [Rivaroxaban] Swelling    Tongue swelling  . Lipitor [Atorvastatin] Other (See Comments)    Mental foggy   Current Outpatient Prescriptions on File Prior to Visit  Medication Sig Dispense Refill  . acetaminophen (TYLENOL) 500 MG tablet Take 500  mg by mouth every 6 (six) hours as needed for pain.    Marland Kitchen albuterol (PROVENTIL HFA;VENTOLIN HFA) 108 (90 BASE) MCG/ACT inhaler Inhale 2 puffs into the lungs every 6 (six) hours as needed for wheezing or shortness of breath. 1 Inhaler 5  . fluticasone (FLONASE) 50 MCG/ACT nasal spray Place 2 sprays into both nostrils daily.    Marland Kitchen ibuprofen (ADVIL,MOTRIN) 200 MG tablet Take 600 mg by mouth every 6 (six) hours as needed for moderate pain.    . methocarbamol (ROBAXIN) 500 MG tablet Take 1 tablet (500 mg total) by mouth every 6 (six) hours as needed for muscle spasms. 50 tablet 0  . nitroGLYCERIN (NITROSTAT) 0.4 MG SL tablet Place 1 tablet (0.4 mg total) under the tongue every 5 (five) minutes as needed for chest pain. 25 tablet 3  . polyethylene glycol (MIRALAX / GLYCOLAX) packet Take 17 g by mouth daily as needed for mild constipation.    . tamsulosin (FLOMAX) 0.4 MG CAPS capsule Take 1 capsule (0.4 mg total) by mouth daily. 90 capsule 3  . tamsulosin (FLOMAX) 0.4 MG CAPS capsule Take 1 capsule (0.4 mg total) by mouth daily. 90 capsule 3   No current facility-administered medications on file prior to visit.    Review of Systems  Constitutional: Negative for unusual diaphoresis or night sweats HENT: Negative for ear swelling or discharge Eyes: Negative for worsening visual haziness  Respiratory: Negative for choking and stridor.   Gastrointestinal: Negative for distension or worsening eructation Genitourinary: Negative for retention or change in urine volume.  Musculoskeletal: Negative for other MSK pain or swelling Skin: Negative for color change and worsening wound Neurological: Negative for tremors and numbness other than noted  Psychiatric/Behavioral: Negative for decreased concentration or agitation other than above       Objective:   Physical Exam BP 122/80 (BP Location: Left Arm, Cuff Size: Normal)   Pulse 72   Temp 98.6 F (37 C) (Oral)   Wt 251 lb (113.9 kg)   SpO2 97%   BMI 29.76  kg/m  VS noted,  Constitutional: Pt appears in no apparent distress HENT: Head: NCAT.  Right Ear: External ear normal.  Left Ear: External ear normal.  Eyes: . Pupils are equal, round, and reactive to light. Conjunctivae and EOM are normal Neck: Normal range of motion. Neck supple.  Cardiovascular: Normal rate and regular rhythm.   Pulmonary/Chest: Effort normal and breath sounds without rales or wheezing.  Abd:  Soft, NT, ND, + BS Spine: no midline tender throughout, does have bilat lumbar paravertebral tender somewhat extending to the bilat flank areas Neurological: Pt is alert. Not confused , motor 5/5 intact Skin: Skin is warm. No rash, no LE edema Psychiatric: Pt behavior is normal. No agitation. 1+ nervous    Assessment & Plan:

## 2016-06-02 NOTE — Patient Instructions (Signed)
Please continue all other medications as before, and refills have been done if requested.  Please have the pharmacy call with any other refills you may need.  Please continue your efforts at being more active, low cholesterol diet, and weight control.  You are otherwise up to date with prevention measures today.  Please keep your appointments with your specialists as you may have planned  Please go to the LAB in the Basement (turn left off the elevator) for the tests to be done today - just the urine testing today  You will be contacted by phone if any changes need to be made immediately.  Otherwise, you will receive a letter about your results with an explanation, but please check with MyChart first.  Please remember to sign up for MyChart if you have not done so, as this will be important to you in the future with finding out test results, communicating by private email, and scheduling acute appointments online when needed.

## 2016-06-03 DIAGNOSIS — F411 Generalized anxiety disorder: Secondary | ICD-10-CM | POA: Insufficient documentation

## 2016-06-03 NOTE — Assessment & Plan Note (Addendum)
Etiology unclear but suspect primarily msk strain +/- underlyling djd/ddd pain, for UA

## 2016-06-03 NOTE — Assessment & Plan Note (Signed)
stable overall by history and exam, recent data reviewed with pt, and pt to continue medical treatment as before,  to f/u any worsening symptoms or concerns Lab Results  Component Value Date   HGBA1C 6.6 (H) 02/10/2016

## 2016-06-03 NOTE — Assessment & Plan Note (Signed)
Mild situational, d/w pt, no indication for specific tx or cousneling at this time

## 2016-06-22 ENCOUNTER — Ambulatory Visit (INDEPENDENT_AMBULATORY_CARE_PROVIDER_SITE_OTHER): Payer: Medicare Other | Admitting: Internal Medicine

## 2016-06-22 ENCOUNTER — Encounter: Payer: Self-pay | Admitting: Internal Medicine

## 2016-06-22 VITALS — BP 136/72 | HR 102 | Temp 98.1°F | Resp 20 | Wt 250.0 lb

## 2016-06-22 DIAGNOSIS — M545 Low back pain, unspecified: Secondary | ICD-10-CM

## 2016-06-22 DIAGNOSIS — K5900 Constipation, unspecified: Secondary | ICD-10-CM | POA: Diagnosis not present

## 2016-06-22 DIAGNOSIS — N529 Male erectile dysfunction, unspecified: Secondary | ICD-10-CM | POA: Diagnosis not present

## 2016-06-22 DIAGNOSIS — G8929 Other chronic pain: Secondary | ICD-10-CM | POA: Diagnosis not present

## 2016-06-22 MED ORDER — SILDENAFIL CITRATE 100 MG PO TABS: 50.0000 mg | ORAL_TABLET | Freq: Every day | ORAL | 11 refills | Status: DC | PRN

## 2016-06-22 NOTE — Progress Notes (Signed)
Pre visit review using our clinic review tool, if applicable. No additional management support is needed unless otherwise documented below in the visit note. 

## 2016-06-22 NOTE — Assessment & Plan Note (Signed)
Exam benign, appears now be radiating pain related to constipation, tx as above,  to f/u any worsening symptoms or concerns

## 2016-06-22 NOTE — Patient Instructions (Signed)
Please take all new medication as prescribed - the viagra as needed  Never take the Nitroglycerin on a day that you take the viagra  Please take all new medication as prescribed - the miralax 17 gm by mouth mixed with water every day  Please continue all other medications as before, and refills have been done if requested.  Please have the pharmacy call with any other refills you may need.  Please keep your appointments with your specialists as you may have planned

## 2016-06-22 NOTE — Assessment & Plan Note (Signed)
.  Mild to mod, for otc miralax daily, stool softner if needed  to f/u any worsening symptoms or concerns

## 2016-06-22 NOTE — Progress Notes (Signed)
Subjective:    Patient ID: Dennis Hill, male    DOB: 07-04-1946, 70 y.o.   MRN: [id number]  HPI  Here to f/u with persistent right LBP, not worse overall, similar to lst visit, see HPI, except has had worsening pain with constipation where it seems the back pain is improved with BM. Miarlax works but not sure if can take every day.  Pt without bladder change, fever, wt loss,  worsening LE pain/numbness/weakness, gait change or falls.   Denies urinary symptoms such as dysuria, frequency, urgency, flank pain, hematuria or n/v, fever, chills.  Denies worsening reflux, abd pain, dysphagia, n/v, bowel change or blood. Last colonoscopy 2012 - mod diverticulosis only.  Also with concerns with worsening ED for 6 mo, carries ntg in pocket, has never used in many years.  Pt denies chest pain, increased sob or doe, wheezing, orthopnea, PND, increased LE swelling, palpitations, dizziness or syncope.  Pt denies new neurological symptoms such as new headache, or facial or extremity weakness or numbness Past Medical History:  Diagnosis Date  . Anxiety   . Atrial bigeminy 2008   Dr. Caryl Comes  . Atrial fibrillation (HCC) flutter   . BPH (benign prostatic hypertrophy) 03/21/2013  . CAD (coronary artery disease)    LHC (10/15):  pLAD 20%, mCFX 20%, pRCA 20%, PDA 20%, EF 40%  . Chronic insomnia   . DEPRESSION 03/15/2009  . DIVERTICULOSIS, COLON 03/15/2009  . Dysrhythmia    seen by Dr Caryl Comes   . 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 Fatty liver   . GERD 03/15/2009   no medications now  . [id number](382.5)    "monthly" (03/06/2013)  . Hx of adenomatous colonic polyps 03/15/2009  . Hx of cardiovascular stress test    ETT-Myoview (9/15):  Inferior infarct, no ischemia, EF 41% - Intermediate Risk  . Hyperlipidemia 02/10/2011  . NEPHROLITHIASIS, HX OF 01/28/2010  . NICM (nonischemic cardiomyopathy) ([place])    Echo (10/15):  Mild LVH, EF 35-40%, diff HK  . Pneumonia ~ 1950  . Sleep apnea    Past Surgical  History:  Procedure Laterality Date  . APPENDECTOMY    . ATRIAL FLUTTER ABLATION  03/06/2013  . ATRIAL FLUTTER ABLATION N/A 03/06/2013   Procedure: ATRIAL FLUTTER ABLATION;  Surgeon: Deboraha Sprang, MD;  Location: [facility] CATH LAB;  Service: Cardiovascular;  Laterality: N/A;  . CARDIOVERSION N/A 02/06/2013   Procedure: CARDIOVERSION;  Surgeon: Evans Lance, MD;  Location: [place];  Service: Cardiovascular;  Laterality: N/A;  . CHOLECYSTECTOMY  2006  . INGUINAL HERNIA REPAIR Right 1986  . LEFT HEART CATHETERIZATION WITH CORONARY ANGIOGRAM N/A 06/19/2014   Procedure: LEFT HEART CATHETERIZATION WITH CORONARY ANGIOGRAM;  Surgeon: Burnell Blanks, MD;  Location: [facility] CATH LAB;  Service: Cardiovascular;  Laterality: N/A;  . TEE WITHOUT CARDIOVERSION N/A 03/05/2013   Procedure: TRANSESOPHAGEAL ECHOCARDIOGRAM (TEE);  Surgeon: Larey Dresser, MD;  Location: [place];  Service: Cardiovascular;  Laterality: N/A;  Jeannene Patella Greggory Brandy  . TONSILLECTOMY AND ADENOIDECTOMY  ~ 1952  . TOTAL KNEE ARTHROPLASTY Left 02/22/2016   Procedure: TOTAL KNEE ARTHROPLASTY;  Surgeon: Melrose Nakayama, MD;  Location: [place];  Service: Orthopedics;  Laterality: Left;    reports that he quit smoking about 36 years ago. His smoking use included Cigarettes. He has a 13.00 pack-year smoking history. He has never used smokeless tobacco. He reports that he drinks about 0.6 oz of alcohol per week . He reports that he does not use drugs. family history includes Cancer  in his maternal grandmother and mother; Colon cancer in his mother; Lung cancer (age of onset: 19) in his mother. Allergies  Allergen Reactions  . Lisinopril Swelling    Tongue swelling  . Xarelto [Rivaroxaban] Swelling    Tongue swelling  . Lipitor [Atorvastatin] Other (See Comments)    Mental foggy   Current Outpatient Prescriptions on File Prior to Visit  Medication Sig Dispense Refill  . acetaminophen (TYLENOL) 500 MG tablet Take 500 mg by mouth every 6 (six) hours as  needed for pain.    Marland Kitchen albuterol (PROVENTIL HFA;VENTOLIN HFA) 108 (90 BASE) MCG/ACT inhaler Inhale 2 puffs into the lungs every 6 (six) hours as needed for wheezing or shortness of breath. 1 Inhaler 5  . fluticasone (FLONASE) 50 MCG/ACT nasal spray Place 2 sprays into both nostrils daily.    Marland Kitchen ibuprofen (ADVIL,MOTRIN) 200 MG tablet Take 600 mg by mouth every 6 (six) hours as needed for moderate pain.    . methocarbamol (ROBAXIN) 500 MG tablet Take 1 tablet (500 mg total) by mouth every 6 (six) hours as needed for muscle spasms. 50 tablet 0  . nitroGLYCERIN (NITROSTAT) 0.4 MG SL tablet Place 1 tablet (0.4 mg total) under the tongue every 5 (five) minutes as needed for chest pain. 25 tablet 3  . polyethylene glycol (MIRALAX / GLYCOLAX) packet Take 17 g by mouth daily as needed for mild constipation.    . tamsulosin (FLOMAX) 0.4 MG CAPS capsule Take 1 capsule (0.4 mg total) by mouth daily. 90 capsule 3   No current facility-administered medications on file prior to visit.    Review of Systems  Constitutional: Negative for unusual diaphoresis or night sweats HENT: Negative for ear swelling or discharge Eyes: Negative for worsening visual haziness  Respiratory: Negative for choking and stridor.   Gastrointestinal: Negative for distension or worsening eructation Genitourinary: Negative for retention or change in urine volume.  Musculoskeletal: Negative for other MSK pain or swelling Skin: Negative for color change and worsening wound Neurological: Negative for tremors and numbness other than noted  Psychiatric/Behavioral: Negative for decreased concentration or agitation other than above       Objective:   Physical Exam BP 136/72   Pulse (!) 102   Temp 98.1 F (36.7 C) (Oral)   Resp 20   Wt 250 lb (113.4 kg)   SpO2 97%   BMI 29.65 kg/m  VS noted, not ill apearing Constitutional: Pt appears in no apparent distress HENT: Head: NCAT.  Right Ear: External ear normal.  Left Ear: External  ear normal.  Eyes: . Pupils are equal, round, and reactive to light. Conjunctivae and EOM are normal Neck: Normal range of motion. Neck supple.  Cardiovascular: Normal rate and regular rhythm.   Pulmonary/Chest: Effort normal and breath sounds without rales or wheezing.  Abd:  Soft, NT, ND, + BS - benign exam Spine nontender, no paravertebral tender or spasm Neurological: Pt is alert. Not confused , motor 5/5 intact Skin: Skin is warm. No rash, no LE edema Psychiatric: Pt behavior is normal. No agitation.     Assessment & Plan:

## 2016-06-22 NOTE — Assessment & Plan Note (Signed)
Mild to mod, for viagra prn,  to f/u any worsening symptoms or concerns 

## 2016-07-06 ENCOUNTER — Encounter: Payer: Self-pay | Admitting: Internal Medicine

## 2016-07-20 ENCOUNTER — Ambulatory Visit: Payer: Medicare Other | Admitting: Internal Medicine

## 2016-08-08 DIAGNOSIS — L570 Actinic keratosis: Secondary | ICD-10-CM | POA: Diagnosis not present

## 2016-08-08 DIAGNOSIS — D692 Other nonthrombocytopenic purpura: Secondary | ICD-10-CM | POA: Diagnosis not present

## 2016-08-08 DIAGNOSIS — Z85828 Personal history of other malignant neoplasm of skin: Secondary | ICD-10-CM | POA: Diagnosis not present

## 2016-08-08 DIAGNOSIS — L821 Other seborrheic keratosis: Secondary | ICD-10-CM | POA: Diagnosis not present

## 2016-09-20 ENCOUNTER — Ambulatory Visit: Payer: Medicare Other | Admitting: Internal Medicine

## 2016-09-25 ENCOUNTER — Ambulatory Visit (INDEPENDENT_AMBULATORY_CARE_PROVIDER_SITE_OTHER): Payer: Medicare Other | Admitting: Internal Medicine

## 2016-09-25 ENCOUNTER — Encounter: Payer: Self-pay | Admitting: Internal Medicine

## 2016-09-25 DIAGNOSIS — I493 Ventricular premature depolarization: Secondary | ICD-10-CM

## 2016-09-25 DIAGNOSIS — I428 Other cardiomyopathies: Secondary | ICD-10-CM

## 2016-09-25 DIAGNOSIS — I4892 Unspecified atrial flutter: Secondary | ICD-10-CM | POA: Diagnosis not present

## 2016-09-25 NOTE — Progress Notes (Signed)
We are     Patient Care Team: Biagio Borg, MD as PCP - General   HPI  Dennis Hill is a 70 y.o. male Seen in follow-up for atrial flutter status post ablation and mild LV dysfunction with an ejection fraction of 40% or so. Myoview scan demonstrated ejection fraction of 41% (9/15) Because of the perfusion defect he underwent catheterization demonstrating nonobstructive disease. (10/15).  He has no complaints of chest pain shortness of breath palpitations weakness or lightheadedness.  He has significant PVCs. Holter monitor was ordered. This was never consummated;  In the past he has been intolerant of statin therapy and beta blockers in the past because of cloudiness of thought.  His wifewho had been diagnosed with stage IV lung cancer died in 2023/02/04. This is been a major challenge with grief  DATE TEST    10/15 echo   [id number]   10/16 echo   [id number]      Past Medical History:  Diagnosis Date  . Anxiety   . Atrial bigeminy 2008   Dr. Caryl Comes  . Atrial fibrillation (HCC) flutter   . BPH (benign prostatic hypertrophy) 03/21/2013  . CAD (coronary artery disease)    LHC (10/15):  pLAD 20%, mCFX 20%, pRCA 20%, PDA 20%, EF 40%  . Chronic insomnia   . DEPRESSION 03/15/2009  . DIVERTICULOSIS, COLON 03/15/2009  . Dysrhythmia    seen by Dr Caryl Comes   . 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 Fatty liver   . GERD 03/15/2009   no medications now  . [id number]    "monthly" (03/06/2013)  . Hx of adenomatous colonic polyps 03/15/2009  . Hx of cardiovascular stress test    ETT-Myoview (9/15):  Inferior infarct, no ischemia, EF 41% - Intermediate Risk  . Hyperlipidemia 02/10/2011  . NEPHROLITHIASIS, HX OF 01/28/2010  . NICM (nonischemic cardiomyopathy) ([place])    Echo (10/15):  Mild LVH, EF 35-40%, diff HK  . Pneumonia ~ 1950  . Sleep apnea     Past Surgical History:  Procedure Laterality Date  . APPENDECTOMY    . ATRIAL FLUTTER ABLATION  03/06/2013  . ATRIAL FLUTTER  ABLATION N/A 03/06/2013   Procedure: ATRIAL FLUTTER ABLATION;  Surgeon: Deboraha Sprang, MD;  Location: [facility] CATH LAB;  Service: Cardiovascular;  Laterality: N/A;  . CARDIOVERSION N/A 02/06/2013   Procedure: CARDIOVERSION;  Surgeon: Evans Lance, MD;  Location: [place];  Service: Cardiovascular;  Laterality: N/A;  . CHOLECYSTECTOMY  2006  . INGUINAL HERNIA REPAIR Right 1986  . LEFT HEART CATHETERIZATION WITH CORONARY ANGIOGRAM N/A 06/19/2014   Procedure: LEFT HEART CATHETERIZATION WITH CORONARY ANGIOGRAM;  Surgeon: Burnell Blanks, MD;  Location: [facility] CATH LAB;  Service: Cardiovascular;  Laterality: N/A;  . TEE WITHOUT CARDIOVERSION N/A 03/05/2013   Procedure: TRANSESOPHAGEAL ECHOCARDIOGRAM (TEE);  Surgeon: Larey Dresser, MD;  Location: [place];  Service: Cardiovascular;  Laterality: N/A;  Jeannene Patella Greggory Brandy  . TONSILLECTOMY AND ADENOIDECTOMY  ~ 1952  . TOTAL KNEE ARTHROPLASTY Left 02/22/2016   Procedure: TOTAL KNEE ARTHROPLASTY;  Surgeon: Melrose Nakayama, MD;  Location: [place];  Service: Orthopedics;  Laterality: Left;    Current Outpatient Prescriptions  Medication Sig Dispense Refill  . acetaminophen (TYLENOL) 500 MG tablet Take 500 mg by mouth every 6 (six) hours as needed for pain.    Marland Kitchen albuterol (PROVENTIL HFA;VENTOLIN HFA) 108 (90 BASE) MCG/ACT inhaler Inhale 2 puffs into the lungs every 6 (six) hours as needed for wheezing or shortness of breath. 1 Inhaler 5  .  fluticasone (FLONASE) 50 MCG/ACT nasal spray Place 2 sprays into both nostrils daily.    Marland Kitchen ibuprofen (ADVIL,MOTRIN) 200 MG tablet Take 600 mg by mouth every 6 (six) hours as needed for moderate pain.    . nitroGLYCERIN (NITROSTAT) 0.4 MG SL tablet Place 1 tablet (0.4 mg total) under the tongue every 5 (five) minutes as needed for chest pain. 25 tablet 3  . polyethylene glycol (MIRALAX / GLYCOLAX) packet Take 17 g by mouth daily as needed for mild constipation.    . tamsulosin (FLOMAX) 0.4 MG CAPS capsule Take 1 capsule (0.4 mg  total) by mouth daily. 90 capsule 3   No current facility-administered medications for this visit.     Allergies  Allergen Reactions  . Lisinopril Swelling    Tongue swelling  . Xarelto [Rivaroxaban] Swelling    Tongue swelling  . Lipitor [Atorvastatin] Other (See Comments)    Mental foggy    Review of Systems negative except from HPI and PMH  Physical Exam BP 122/80   Pulse 77   Ht 6\' 5"  (1.956 m)   Wt 251 lb (113.9 kg)   SpO2 98%   BMI 29.76 kg/m  Well developed and well nourished in no acute distress HENT normal E scleral and icterus clear Neck Supple JVP flat; carotids brisk and full Clear to ausculation  *Regular rate and rhythm, no murmurs gallops or rub Soft with active bowel sounds No clubbing cyanosis  Edema Alert and oriented, grossly normal motor and sensory function Skin Warm and Dry  ECG was ordered today demonstrated sinus rhythm at 74  17/10/43 Occ PAC and PVC  Assessment and  Plan  Atrial flutter status post ablation  Nonischemic cardiomyopathy  Ace beta blocker and statin intolerance  PVCs question frequency  Dyslipidemia with low HDL an 10 year cardiovascular predicted risk   of 20%  Grief    He is not a candidate for ace inhibitors and beta blockers because of intolerance. We will plan to reassess his ejection fraction is just visit.  Euvolemic continue current meds  We discussed the loss of his wife  We spent more than 50% of our >25 min visit in face to face counseling regarding the above

## 2016-09-25 NOTE — Patient Instructions (Signed)

## 2016-09-26 DIAGNOSIS — M71342 Other bursal cyst, left hand: Secondary | ICD-10-CM | POA: Diagnosis not present

## 2016-10-25 DIAGNOSIS — M25562 Pain in left knee: Secondary | ICD-10-CM | POA: Diagnosis not present

## 2016-11-14 DIAGNOSIS — L820 Inflamed seborrheic keratosis: Secondary | ICD-10-CM | POA: Diagnosis not present

## 2016-11-14 DIAGNOSIS — L821 Other seborrheic keratosis: Secondary | ICD-10-CM | POA: Diagnosis not present

## 2017-01-09 ENCOUNTER — Encounter: Payer: Medicare Other | Admitting: Internal Medicine

## 2017-03-07 ENCOUNTER — Ambulatory Visit (INDEPENDENT_AMBULATORY_CARE_PROVIDER_SITE_OTHER): Payer: Medicare Other | Admitting: Internal Medicine

## 2017-03-07 ENCOUNTER — Encounter: Payer: Self-pay | Admitting: Internal Medicine

## 2017-03-07 VITALS — BP 122/82 | HR 69 | Ht 77.0 in | Wt 243.0 lb

## 2017-03-07 DIAGNOSIS — R739 Hyperglycemia, unspecified: Secondary | ICD-10-CM

## 2017-03-07 DIAGNOSIS — M19049 Primary osteoarthritis, unspecified hand: Secondary | ICD-10-CM | POA: Diagnosis not present

## 2017-03-07 DIAGNOSIS — R1011 Right upper quadrant pain: Secondary | ICD-10-CM

## 2017-03-07 MED ORDER — DICLOFENAC SODIUM 1 % TD GEL: 4.0000 g | Freq: Four times a day (QID) | TRANSDERMAL | 5 refills | Status: AC | PRN

## 2017-03-07 MED ORDER — TAMSULOSIN HCL 0.4 MG PO CAPS: 0.4000 mg | ORAL_CAPSULE | Freq: Every day | ORAL | 3 refills | Status: DC

## 2017-03-07 MED ORDER — DICLOFENAC SODIUM 1 % TD GEL: 4.0000 g | Freq: Four times a day (QID) | TRANSDERMAL | 5 refills | Status: DC | PRN

## 2017-03-07 NOTE — Patient Instructions (Addendum)
Please take all new medication as prescribed - the voltaren gel topical for hand pain as needed  Please continue all other medications as before, and refills have been done if requested - hardcopy  Please have the pharmacy call with any other refills you may need.  Please keep your appointments with your specialists as you may have planned  You will be contacted regarding the referral for: CT scan (to see PCC's now)  Please go to the LAB in the Basement (turn left off the elevator) for the tests to be done at your convenience  You will be contacted by phone if any changes need to be made immediately.  Otherwise, you will receive a letter about your results with an explanation, but please check with MyChart first.  Please remember to sign up for MyChart if you have not done so, as this will be important to you in the future with finding out test results, communicating by private email, and scheduling acute appointments online when needed.

## 2017-03-07 NOTE — Progress Notes (Signed)
Subjective:    Patient ID: Dennis Hill, male    DOB: March 27, 1946, 71 y.o.   MRN: [id number]  HPI here with c/o 1 yr right sided abd pain, somewhat lower than midline, mild, sometimes raidates toward the back, worse to lie directly on right side, better to not do that,  No relation to diet changes per pt;  Has BM's every 2-3 days but no improvement in pain, no n/v, no bleeding, BRB or melena;  No fever and Denies urinary symptoms such as dysuria, frequency, urgency, flank pain, hematuria or n/v, fever, chills.  Has lost 8 lbs recently for unclear reasons, overall lost over 35 lbs since may 2017 after left knee TKA.  Trying to hold off on having the right TKA for now.  In process of moving business to [place], [place].  Hungry all the time, tends to eat fairly often but can only eat hafl sandwich due to early satiety.   Wt Readings from Last 3 Encounters:  03/07/17 243 lb (110.2 kg)  09/25/16 251 lb (113.9 kg)  06/22/16 250 lb (113.4 kg)  last EGD and colonoscopy per D Brodie 2012 neg for acute. S/p ccx. No hx of adhesions  Also c/o right hand index finger MCP pain on going moderate for months, asks for pain help.  Only iin [place] 4-5 days per month now Past Medical History:  Diagnosis Date  . Anxiety   . Atrial bigeminy 2008   Dr. Caryl Comes  . Atrial fibrillation (HCC) flutter   . BPH (benign prostatic hypertrophy) 03/21/2013  . CAD (coronary artery disease)    LHC (10/15):  pLAD 20%, mCFX 20%, pRCA 20%, PDA 20%, EF 40%  . Chronic insomnia   . DEPRESSION 03/15/2009  . DIVERTICULOSIS, COLON 03/15/2009  . Dysrhythmia    seen by Dr Caryl Comes   . 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 Fatty liver   . GERD 03/15/2009   no medications now  . LPFXTKWI(097.3)    "monthly" (03/06/2013)  . Hx of adenomatous colonic polyps 03/15/2009  . Hx of cardiovascular stress test    ETT-Myoview (9/15):  Inferior infarct, no ischemia, EF 41% - Intermediate Risk  . Hyperlipidemia 02/10/2011  . NEPHROLITHIASIS, HX  OF 01/28/2010  . NICM (nonischemic cardiomyopathy) ([place])    Echo (10/15):  Mild LVH, EF 35-40%, diff HK  . Pneumonia ~ 1950  . Sleep apnea    Past Surgical History:  Procedure Laterality Date  . APPENDECTOMY    . ATRIAL FLUTTER ABLATION  03/06/2013  . ATRIAL FLUTTER ABLATION N/A 03/06/2013   Procedure: ATRIAL FLUTTER ABLATION;  Surgeon: Deboraha Sprang, MD;  Location: [facility] CATH LAB;  Service: Cardiovascular;  Laterality: N/A;  . CARDIOVERSION N/A 02/06/2013   Procedure: CARDIOVERSION;  Surgeon: Evans Lance, MD;  Location: [place];  Service: Cardiovascular;  Laterality: N/A;  . CHOLECYSTECTOMY  2006  . INGUINAL HERNIA REPAIR Right 1986  . LEFT HEART CATHETERIZATION WITH CORONARY ANGIOGRAM N/A 06/19/2014   Procedure: LEFT HEART CATHETERIZATION WITH CORONARY ANGIOGRAM;  Surgeon: Burnell Blanks, MD;  Location: [facility] CATH LAB;  Service: Cardiovascular;  Laterality: N/A;  . TEE WITHOUT CARDIOVERSION N/A 03/05/2013   Procedure: TRANSESOPHAGEAL ECHOCARDIOGRAM (TEE);  Surgeon: Larey Dresser, MD;  Location: [place];  Service: Cardiovascular;  Laterality: N/A;  Dennis Hill  . TONSILLECTOMY AND ADENOIDECTOMY  ~ 1952  . TOTAL KNEE ARTHROPLASTY Left 02/22/2016   Procedure: TOTAL KNEE ARTHROPLASTY;  Surgeon: Melrose Nakayama, MD;  Location: [place];  Service: Orthopedics;  Laterality: Left;    reports that he quit smoking about 37 years ago. His smoking use included Cigarettes. He has a 13.00 pack-year smoking history. He has never used smokeless tobacco. He reports that he drinks about 0.6 oz of alcohol per week . He reports that he does not use drugs. family history includes Cancer in his maternal grandmother and mother; Colon cancer in his mother; Lung cancer (age of onset: 18) in his mother. Allergies  Allergen Reactions  . Lisinopril Swelling    Tongue swelling  . Xarelto [Rivaroxaban] Swelling    Tongue swelling  . Lipitor [Atorvastatin] Other (See Comments)    Mental foggy   Current  Outpatient Prescriptions on File Prior to Visit  Medication Sig Dispense Refill  . acetaminophen (TYLENOL) 500 MG tablet Take 500 mg by mouth every 6 (six) hours as needed for pain.    Marland Kitchen albuterol (PROVENTIL HFA;VENTOLIN HFA) 108 (90 BASE) MCG/ACT inhaler Inhale 2 puffs into the lungs every 6 (six) hours as needed for wheezing or shortness of breath. 1 Inhaler 5  . fluticasone (FLONASE) 50 MCG/ACT nasal spray Place 2 sprays into both nostrils daily.    Marland Kitchen ibuprofen (ADVIL,MOTRIN) 200 MG tablet Take 600 mg by mouth every 6 (six) hours as needed for moderate pain.    . nitroGLYCERIN (NITROSTAT) 0.4 MG SL tablet Place 1 tablet (0.4 mg total) under the tongue every 5 (five) minutes as needed for chest pain. 25 tablet 3  . polyethylene glycol (MIRALAX / GLYCOLAX) packet Take 17 g by mouth daily as needed for mild constipation.    . tamsulosin (FLOMAX) 0.4 MG CAPS capsule Take 1 capsule (0.4 mg total) by mouth daily. 90 capsule 3   No current facility-administered medications on file prior to visit.   ' Review of Systems  Constitutional: Negative for other unusual diaphoresis or sweats HENT: Negative for ear discharge or swelling Eyes: Negative for other worsening visual disturbances Respiratory: Negative for stridor or other swelling  Gastrointestinal: Negative for worsening distension or other blood Genitourinary: Negative for retention or other urinary change Musculoskeletal: Negative for other MSK pain or swelling Skin: Negative for color change or other new lesions Neurological: Negative for worsening tremors and other numbness  Psychiatric/Behavioral: Negative for worsening agitation or other fatigue All other system neg    Objective:   Physical Exam BP 122/82   Pulse 69   Ht 6\' 5"  (1.956 m)   Wt 243 lb (110.2 kg)   SpO2 98%   BMI 28.82 kg/m  VS noted, not ill appearing Constitutional: Pt appears in NAD HENT: Head: NCAT.  Right Ear: External ear normal.  Left Ear: External ear  normal.  Eyes: . Pupils are equal, round, and reactive to light. Conjunctivae and EOM are normal Nose: without d/c or deformity Neck: Neck supple. Gross normal ROM Cardiovascular: Normal rate and regular rhythm.   Pulmonary/Chest: Effort normal and breath sounds without rales or wheezing.  Abd:  Soft, ND, + BS, no organomegaly, mild right lateral mid abd tender noted without swelling or rash Right thumb CMC with marked degenrative change and bony tenderness Neurological: Pt is alert. At baseline orientation, motor grossly intact Skin: Skin is warm. No rashes, other new lesions, no LE edema Psychiatric: Pt behavior is normal without agitation ., mild nervous No other exam findings  01/21/2015 EXAM: ULTRASOUND ABDOMEN COMPLETE IMPRESSION: 1. Diffuse fatty infiltration of the liver. No focal hepatic abnormality. 2. Much of the pancreas and abdominal aorta are obscured by bowel gas.  3. 13 mm nonobstructing left upper pole renal calculus.    Assessment & Plan:

## 2017-03-08 ENCOUNTER — Telehealth: Payer: Self-pay

## 2017-03-08 ENCOUNTER — Other Ambulatory Visit: Payer: Medicare Other

## 2017-03-08 ENCOUNTER — Ambulatory Visit (INDEPENDENT_AMBULATORY_CARE_PROVIDER_SITE_OTHER)
Admission: RE | Admit: 2017-03-08 | Discharge: 2017-03-08 | Disposition: A | Discharge status: Home / Self Care | Payer: Medicare Other | Source: Ambulatory Visit | Attending: Internal Medicine | Admitting: Internal Medicine

## 2017-03-08 ENCOUNTER — Other Ambulatory Visit (INDEPENDENT_AMBULATORY_CARE_PROVIDER_SITE_OTHER): Payer: Medicare Other

## 2017-03-08 DIAGNOSIS — R1011 Right upper quadrant pain: Secondary | ICD-10-CM

## 2017-03-08 DIAGNOSIS — R109 Unspecified abdominal pain: Secondary | ICD-10-CM | POA: Diagnosis not present

## 2017-03-08 LAB — CBC WITH DIFFERENTIAL/PLATELET
BASOS ABS: 0.1 10*3/uL (ref 0.0–0.1)
Basophils Relative: 1.3 % (ref 0.0–3.0)
EOS PCT: 3 % (ref 0.0–5.0)
Eosinophils Absolute: 0.2 10*3/uL (ref 0.0–0.7)
HEMATOCRIT: 44.6 % (ref 39.0–52.0)
HEMOGLOBIN: 14.8 g/dL (ref 13.0–17.0)
LYMPHS PCT: 29.3 % (ref 12.0–46.0)
Lymphs Abs: 2 10*3/uL (ref 0.7–4.0)
MCHC: 33.1 g/dL (ref 30.0–36.0)
MCV: 88.2 fl (ref 78.0–100.0)
Monocytes Absolute: 0.7 10*3/uL (ref 0.1–1.0)
Monocytes Relative: 10 % (ref 3.0–12.0)
NEUTROS PCT: 56.4 % (ref 43.0–77.0)
Neutro Abs: 3.9 10*3/uL (ref 1.4–7.7)
Platelets: 207 10*3/uL (ref 150.0–400.0)
RBC: 5.06 Mil/uL (ref 4.22–5.81)
RDW: 14.4 % (ref 11.5–15.5)
WBC: 6.9 10*3/uL (ref 4.0–10.5)

## 2017-03-08 LAB — BASIC METABOLIC PANEL
BUN: 24 mg/dL — AB (ref 6–23)
CALCIUM: 9.8 mg/dL (ref 8.4–10.5)
CO2: 28 meq/L (ref 19–32)
Chloride: 107 mEq/L (ref 96–112)
Creatinine, Ser: 1.26 mg/dL (ref 0.40–1.50)
GFR: 59.62 mL/min — ABNORMAL LOW (ref >60.00)
GLUCOSE: 119 mg/dL — AB (ref 70–99)
POTASSIUM: 4.3 meq/L (ref 3.5–5.1)
SODIUM: 141 meq/L (ref 135–145)

## 2017-03-08 LAB — HEPATIC FUNCTION PANEL
ALBUMIN: 4 g/dL (ref 3.5–5.2)
ALK PHOS: 79 U/L (ref 39–117)
ALT: 13 U/L (ref 0–53)
AST: 16 U/L (ref 0–37)
Bilirubin, Direct: 0.1 mg/dL (ref 0.0–0.3)
TOTAL PROTEIN: 6.2 g/dL (ref 6.0–8.3)
Total Bilirubin: 0.6 mg/dL (ref 0.2–1.2)

## 2017-03-08 LAB — LIPASE: Lipase: 25 U/L (ref 11.0–59.0)

## 2017-03-08 LAB — URINALYSIS, ROUTINE W REFLEX MICROSCOPIC
Bilirubin Urine: NEGATIVE
Hgb urine dipstick: NEGATIVE
Ketones, ur: NEGATIVE
Leukocytes, UA: NEGATIVE
Nitrite: NEGATIVE
PH: 6 (ref 5.0–8.0)
SPECIFIC GRAVITY, URINE: 1.02 (ref 1.000–1.030)
Total Protein, Urine: NEGATIVE
URINE GLUCOSE: NEGATIVE
Urobilinogen, UA: 1 (ref 0.0–1.0)

## 2017-03-08 MED ORDER — IOPAMIDOL (ISOVUE-300) INJECTION 61%
100.0000 mL | Freq: Once | INTRAVENOUS | Status: AC | PRN
  Administered 2017-03-08: 100 mL via INTRAVENOUS

## 2017-03-08 NOTE — Assessment & Plan Note (Signed)
stable overall by history and exam, recent data reviewed with pt, and pt to continue medical treatment as before,  to f/u any worsening symptoms or concerns, for f/u with labs 

## 2017-03-08 NOTE — Telephone Encounter (Signed)
Key: [id number] Approved through 09/17/17

## 2017-03-08 NOTE — Assessment & Plan Note (Signed)
Etiology unclear with recent wt loss conerning, exam bening, for BMP and labs as ordered and CT abd/pelvis to r/o malignancy, hopefully tomorrow as he is in town only a few days

## 2017-03-08 NOTE — Assessment & Plan Note (Signed)
Mild to mod, for volt gel prn,  to f/u any worsening symptoms or concerns 

## 2017-05-01 ENCOUNTER — Other Ambulatory Visit: Payer: Self-pay | Admitting: Internal Medicine

## 2017-05-01 NOTE — Telephone Encounter (Signed)
Pt called regarding this  Dennis Hill 03/07/2017

## 2017-05-18 DIAGNOSIS — M25562 Pain in left knee: Secondary | ICD-10-CM | POA: Diagnosis not present

## 2017-05-18 DIAGNOSIS — Z96652 Presence of left artificial knee joint: Secondary | ICD-10-CM | POA: Diagnosis not present

## 2017-07-27 ENCOUNTER — Ambulatory Visit (INDEPENDENT_AMBULATORY_CARE_PROVIDER_SITE_OTHER): Payer: Medicare Other | Admitting: Internal Medicine

## 2017-07-27 ENCOUNTER — Encounter: Payer: Self-pay | Admitting: Internal Medicine

## 2017-07-27 ENCOUNTER — Ambulatory Visit (INDEPENDENT_AMBULATORY_CARE_PROVIDER_SITE_OTHER)
Admission: RE | Admit: 2017-07-27 | Discharge: 2017-07-27 | Disposition: A | Discharge status: Home / Self Care | Payer: Medicare Other | Source: Ambulatory Visit | Attending: Internal Medicine | Admitting: Internal Medicine

## 2017-07-27 VITALS — BP 124/82 | Temp 97.7°F | Ht 77.0 in | Wt 255.0 lb

## 2017-07-27 DIAGNOSIS — H43391 Other vitreous opacities, right eye: Secondary | ICD-10-CM

## 2017-07-27 DIAGNOSIS — R911 Solitary pulmonary nodule: Secondary | ICD-10-CM

## 2017-07-27 DIAGNOSIS — M79641 Pain in right hand: Secondary | ICD-10-CM

## 2017-07-27 DIAGNOSIS — R1011 Right upper quadrant pain: Secondary | ICD-10-CM | POA: Diagnosis not present

## 2017-07-27 DIAGNOSIS — Z23 Encounter for immunization: Secondary | ICD-10-CM

## 2017-07-27 DIAGNOSIS — M1712 Unilateral primary osteoarthritis, left knee: Secondary | ICD-10-CM | POA: Diagnosis not present

## 2017-07-27 DIAGNOSIS — M5432 Sciatica, left side: Secondary | ICD-10-CM

## 2017-07-27 DIAGNOSIS — R918 Other nonspecific abnormal finding of lung field: Secondary | ICD-10-CM | POA: Diagnosis not present

## 2017-07-27 DIAGNOSIS — H538 Other visual disturbances: Secondary | ICD-10-CM | POA: Diagnosis not present

## 2017-07-27 MED ORDER — GABAPENTIN 100 MG PO CAPS: 100.0000 mg | ORAL_CAPSULE | Freq: Three times a day (TID) | ORAL | 5 refills | Status: DC

## 2017-07-27 NOTE — Patient Instructions (Addendum)
Please take all new medication as prescribed - the gabapentin  Please call in 1 - 2 weeks if you would want to try a higher dose of the gabapentin  Please continue all other medications as before, and refills have been done if requested.  Please have the pharmacy call with any other refills you may need.  Please continue your efforts at being more active, low cholesterol diet, and weight control.  You are otherwise up to date with prevention measures today.  Please keep your appointments with your specialists as you may have planned  You will be contacted regarding the referral for: CT Chest - for today if possible  You will be contacted regarding the referral for: Eye doctor and Hand Surgury for later in December 2018 if can be arranged

## 2017-07-27 NOTE — Progress Notes (Signed)
Subjective:    Patient ID: Dennis Hill, male    DOB: 1946/06/17, 71 y.o.   MRN: [id number]  HPI   Here with several issues, including f/u right upper quadrant pain unexplained, ? Neuritic type with radiation now from the midline mid thoracic area. Denies worsening reflux, abd pain, dysphagia, n/v, bowel change or blood.  Also c/o recurring left LBP with some radiation in termittently to the left buttock and sometimes lateral thigh area, but without change in frequency or severity, and no bowel or bladder change, fever, wt loss,  worsening LE numbness/weakness, gait change or falls, just overall still bothers him for several months.  Also has chronic left knee pain s/p left TKR but plans to f/u with ortho for this, no giveaways or falls or fever.  Has known right apical nodule on CT in June 2018, due for f/u.   Pt denies fever, wt loss, night sweats, loss of appetite, or other constitutional symptoms  Pt denies chest pain, increased sob or doe, wheezing, orthopnea, PND, increased LE swelling, palpitations, dizziness or syncope.  Does also c/o an unusual floater to the right vision for wks without pain, other vision change, HA, or visual field loss.  Also c/o right hand first MCP marked bony degenerative changes now quite painful, asks for hand surgury referral.  Wt has increased with being less active lately. Wt Readings from Last 3 Encounters:  07/27/17 255 lb (115.7 kg)  03/07/17 243 lb (110.2 kg)  09/25/16 251 lb (113.9 kg)   Past Medical History:  Diagnosis Date  . Anxiety   . Atrial bigeminy 2008   Dr. Caryl Comes  . Atrial fibrillation (HCC) flutter   . BPH (benign prostatic hypertrophy) 03/21/2013  . CAD (coronary artery disease)    LHC (10/15):  pLAD 20%, mCFX 20%, pRCA 20%, PDA 20%, EF 40%  . Chronic insomnia   . DEPRESSION 03/15/2009  . DIVERTICULOSIS, COLON 03/15/2009  . Dysrhythmia    seen by Dr Caryl Comes   . 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 Fatty liver   . GERD 03/15/2009     no medications now  . KNLZJQBH(419.3)    "monthly" (03/06/2013)  . Hx of adenomatous colonic polyps 03/15/2009  . Hx of cardiovascular stress test    ETT-Myoview (9/15):  Inferior infarct, no ischemia, EF 41% - Intermediate Risk  . Hyperlipidemia 02/10/2011  . NEPHROLITHIASIS, HX OF 01/28/2010  . NICM (nonischemic cardiomyopathy) ([place])    Echo (10/15):  Mild LVH, EF 35-40%, diff HK  . Pneumonia ~ 1950  . Sleep apnea    Past Surgical History:  Procedure Laterality Date  . APPENDECTOMY    . ATRIAL FLUTTER ABLATION  03/06/2013  . CHOLECYSTECTOMY  2006  . INGUINAL HERNIA REPAIR Right 1986  . TONSILLECTOMY AND ADENOIDECTOMY  ~ 1952    reports that he quit smoking about 37 years ago. His smoking use included cigarettes. He has a 13.00 pack-year smoking history. he has never used smokeless tobacco. He reports that he drinks about 0.6 oz of alcohol per week. He reports that he does not use drugs. family history includes Cancer in his maternal grandmother and mother; Colon cancer in his mother; Lung cancer (age of onset: 76) in his mother. Allergies  Allergen Reactions  . Lisinopril Swelling    Tongue swelling  . Xarelto [Rivaroxaban] Swelling    Tongue swelling  . Lipitor [Atorvastatin] Other (See Comments)    Mental foggy   Current Outpatient Medications on File Prior to Visit  Medication Sig Dispense Refill  . albuterol (PROVENTIL HFA;VENTOLIN HFA) 108 (90 BASE) MCG/ACT inhaler Inhale 2 puffs into the lungs every 6 (six) hours as needed for wheezing or shortness of breath. 1 Inhaler 5  . diclofenac sodium (VOLTAREN) 1 % GEL Apply 4 g topically 4 (four) times daily as needed. 400 g 5  . fluticasone (FLONASE) 50 MCG/ACT nasal spray Place 2 sprays into both nostrils daily.    Marland Kitchen ibuprofen (ADVIL,MOTRIN) 200 MG tablet Take 600 mg by mouth every 6 (six) hours as needed for moderate pain.    . nitroGLYCERIN (NITROSTAT) 0.4 MG SL tablet Place 1 tablet (0.4 mg total) under the tongue every 5  (five) minutes as needed for chest pain. 25 tablet 3  . polyethylene glycol (MIRALAX / GLYCOLAX) packet Take 17 g by mouth daily as needed for mild constipation.    . tamsulosin (FLOMAX) 0.4 MG CAPS capsule TAKE 1 CAPSULE (0.4 MG TOTAL) BY MOUTH DAILY. 90 capsule 2   No current facility-administered medications on file prior to visit.    Review of Systems  Constitutional: Negative for other unusual diaphoresis or sweats HENT: Negative for ear discharge or swelling Eyes: Negative for other worsening visual disturbances Respiratory: Negative for stridor or other swelling  Gastrointestinal: Negative for worsening distension or other blood Genitourinary: Negative for retention or other urinary change Musculoskeletal: Negative for other MSK pain or swelling Skin: Negative for color change or other new lesions Neurological: Negative for worsening tremors and other numbness  Psychiatric/Behavioral: Negative for worsening agitation or other fatigue All other system neg per pt    Objective:   Physical Exam BP 124/82   Temp 97.7 F (36.5 C) (Oral)   Ht 6\' 5"  (1.956 m)   Wt 255 lb (115.7 kg)   BMI 30.24 kg/m  VS noted,  Constitutional: Pt appears in NAD HENT: Head: NCAT.  Right Ear: External ear normal.  Left Ear: External ear normal.  Eyes: . Pupils are equal, round, and reactive to light. Conjunctivae and EOM are normal, visual fields intact Nose: without d/c or deformity Neck: Neck supple. Gross normal ROM Cardiovascular: Normal rate and regular rhythm.   Pulmonary/Chest: Effort normal and breath sounds without rales or wheezing.  Abd:  Soft, NT, ND, + BS, no organomegaly Spine nontender in midline but has some tender mid thoracic right paravertebral Right hand first MCP with very large bony degenerative change, mild tender and reduced ROM Neurological: Pt is alert. At baseline orientation, motor grossly intact Skin: Skin is warm. No rashes, other new lesions, no LE  edema Psychiatric: Pt behavior is normal without agitation  No other exam findings Lab Results  Component Value Date   WBC 6.9 03/08/2017   HGB 14.8 03/08/2017   HCT 44.6 03/08/2017   PLT 207.0 03/08/2017   GLUCOSE 119 (H) 03/08/2017   CHOL 122 01/18/2016   TRIG 96.0 01/18/2016   HDL 34.00 (L) 01/18/2016   LDLDIRECT 87.2 09/05/2013   LDLCALC 69 01/18/2016   ALT 13 03/08/2017   AST 16 03/08/2017   NA 141 03/08/2017   K 4.3 03/08/2017   CL 107 03/08/2017   CREATININE 1.26 03/08/2017   BUN 24 (H) 03/08/2017   CO2 28 03/08/2017   TSH 1.34 01/18/2016   PSA 3.45 01/18/2016   INR 1.12 02/10/2016   HGBA1C 6.6 (H) 02/10/2016   MICROALBUR 0.7 01/14/2015       Assessment & Plan:

## 2017-07-29 NOTE — Assessment & Plan Note (Signed)
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

## 2017-07-29 NOTE — Assessment & Plan Note (Addendum)
Mild to mod persistently recurring - for gabapentin trial  Note:  Total time for pt hx, exam, review of record with pt in the room, determination of diagnoses and plan for further eval and tx is > 40 min, with over 50% spent in coordination and counseling of patient including the differential dx, tx, further evaluation or other management of chronic RUQ pain, left sciatica, right hand DJD, left knee DJD, right eye floater and pulmonary nodule.

## 2017-07-29 NOTE — Assessment & Plan Note (Signed)
For f/u optho as well

## 2017-07-29 NOTE — Assessment & Plan Note (Signed)
Pt has some baseline blurred vision needing correction, but no worsening, but right eye floater very much bothers him, ok for optho referral as has not seen optho in several yrs

## 2017-07-29 NOTE — Assessment & Plan Note (Signed)
To f/u ortho as planned 

## 2017-07-29 NOTE — Assessment & Plan Note (Signed)
Asympt, for f/u CT chest wo CM,  to f/u any worsening symptoms or concerns

## 2017-08-06 ENCOUNTER — Telehealth: Payer: Self-pay | Admitting: Internal Medicine

## 2017-08-06 MED ORDER — GABAPENTIN 100 MG PO CAPS: 100.0000 mg | ORAL_CAPSULE | Freq: Three times a day (TID) | ORAL | 5 refills | Status: DC

## 2017-08-06 NOTE — Telephone Encounter (Signed)
Pt called and would like his  gabapentin (NEURONTIN) 100 MG capsule  Resent to the [facility] in [place] on his File  Please advise

## 2017-08-06 NOTE — Telephone Encounter (Signed)
Done

## 2017-08-13 DIAGNOSIS — R0789 Other chest pain: Secondary | ICD-10-CM | POA: Diagnosis not present

## 2017-08-13 DIAGNOSIS — Z96652 Presence of left artificial knee joint: Secondary | ICD-10-CM | POA: Diagnosis not present

## 2017-08-13 DIAGNOSIS — R079 Chest pain, unspecified: Secondary | ICD-10-CM | POA: Diagnosis not present

## 2017-08-13 DIAGNOSIS — Z8679 Personal history of other diseases of the circulatory system: Secondary | ICD-10-CM | POA: Diagnosis not present

## 2017-08-13 DIAGNOSIS — Z888 Allergy status to other drugs, medicaments and biological substances status: Secondary | ICD-10-CM | POA: Diagnosis not present

## 2017-08-13 DIAGNOSIS — Z9889 Other specified postprocedural states: Secondary | ICD-10-CM | POA: Diagnosis not present

## 2017-08-13 DIAGNOSIS — I491 Atrial premature depolarization: Secondary | ICD-10-CM | POA: Diagnosis not present

## 2017-08-13 DIAGNOSIS — Z9049 Acquired absence of other specified parts of digestive tract: Secondary | ICD-10-CM | POA: Diagnosis not present

## 2017-08-14 DIAGNOSIS — Z8679 Personal history of other diseases of the circulatory system: Secondary | ICD-10-CM | POA: Diagnosis not present

## 2017-08-14 DIAGNOSIS — R079 Chest pain, unspecified: Secondary | ICD-10-CM | POA: Diagnosis not present

## 2017-08-14 MED ORDER — GABAPENTIN 100 MG PO CAPS: 100.0000 mg | ORAL_CAPSULE | Freq: Three times a day (TID) | ORAL | 5 refills | Status: DC

## 2017-08-14 NOTE — Telephone Encounter (Signed)
Ok to try fax, though if not working, pt will need to have himself or other person pick up hardcopy

## 2017-08-14 NOTE — Telephone Encounter (Signed)
Faxed

## 2017-08-14 NOTE — Telephone Encounter (Signed)
Dr. Jenny Reichmann I have resent this medication multiple times. Can you try your luck. Thanks.

## 2017-08-14 NOTE — Telephone Encounter (Addendum)
Relation to GA:CGBK Call back number: [phone]  Reason for call:  Pharmacy never received  gabapentin (NEURONTIN) 100 MG capsule, patient requesting Rx re send to   [facility] # [id number] [place] [phone] (Phone) [phone] (Fax)

## 2017-08-14 NOTE — Addendum Note (Signed)
Addended by: Biagio Borg on: 08/14/2017 04:01 PM   Modules accepted: Orders

## 2017-08-15 NOTE — Telephone Encounter (Signed)
I have contacted pt, no answer, LVM needing address to send script to him.

## 2017-08-15 NOTE — Telephone Encounter (Signed)
Are you going to send it to him by mail?   If so, someone needs to contact him and let him know either you or let us at [facility] know and we can let him know.    Thanks.

## 2017-08-15 NOTE — Telephone Encounter (Signed)
Pt needs this script sent to the [facility] in [place]. Fax [phone]. thanks

## 2017-08-15 NOTE — Telephone Encounter (Signed)
See message below °

## 2017-08-15 NOTE — Telephone Encounter (Signed)
That is where the script was faxed to yesterday and the previous attempts. If the pharmacy is still not receiving it the script after multiple times the best I can offer now is sending it to him by mail.

## 2017-08-16 ENCOUNTER — Telehealth: Payer: Self-pay | Admitting: Internal Medicine

## 2017-08-16 NOTE — Telephone Encounter (Signed)
Copied from [place]. Topic: General - Other >> Aug 16, 2017  9:13 AM Cecelia Byars, [place] wrote: Reason for CRM: Patient called to give  home address in [facility] [address] [place],[address], [place] [address] for prescription  be  sent to pharmacy at [facility] [place]  fax [phone], patients number is [phone]

## 2017-08-16 NOTE — Telephone Encounter (Signed)
Address verified with patient. Script had been sent out via mail.

## 2017-08-16 NOTE — Telephone Encounter (Signed)
Please advise   Patient called to give  home address in [facility] [address] [place],[address], [place] [address] for prescription  be  sent to pharmacy at [facility] in [place]  fax [phone], patients number is [phone]

## 2017-08-16 NOTE — Telephone Encounter (Signed)
See telephone note 11/19

## 2017-08-20 DIAGNOSIS — B078 Other viral warts: Secondary | ICD-10-CM | POA: Diagnosis not present

## 2017-08-20 DIAGNOSIS — L2084 Intrinsic (allergic) eczema: Secondary | ICD-10-CM | POA: Diagnosis not present

## 2017-08-20 DIAGNOSIS — L570 Actinic keratosis: Secondary | ICD-10-CM | POA: Diagnosis not present

## 2017-08-20 DIAGNOSIS — L820 Inflamed seborrheic keratosis: Secondary | ICD-10-CM | POA: Diagnosis not present

## 2017-08-21 ENCOUNTER — Other Ambulatory Visit: Payer: Self-pay | Admitting: Internal Medicine

## 2017-08-21 NOTE — Telephone Encounter (Signed)
Copied from [place] [id number]. Topic: Quick Communication - See Telephone Encounter >> Aug 21, 2017 12:38 PM Corie Chiquito, [place] wrote: CRM for notification. See Telephone encounter for: Verdis Frederickson from [facility] in [place] called because this patient needs a refill on his Gabapentin. If someone could give them a call back at [phone]  08/21/17.

## 2017-08-21 NOTE — Telephone Encounter (Signed)
Called [facility] back. States Gabapentin is received in the system.

## 2017-08-24 ENCOUNTER — Telehealth: Payer: Self-pay | Admitting: Internal Medicine

## 2017-08-24 NOTE — Telephone Encounter (Signed)
Copied from [place] [id number]. Topic: General - Other >> Aug 24, 2017 11:17 AM Conception Chancy, NT wrote: Reason for CRM: [facility] [place] is needing Dr. Jenny Reichmann nurse to call and give verbals for the Gabapentin 100mg .   [phone]

## 2017-08-24 NOTE — Telephone Encounter (Signed)
Done

## 2017-09-13 ENCOUNTER — Ambulatory Visit (INDEPENDENT_AMBULATORY_CARE_PROVIDER_SITE_OTHER): Payer: Medicare Other | Admitting: Family Medicine

## 2017-09-13 ENCOUNTER — Encounter: Payer: Self-pay | Admitting: Family Medicine

## 2017-09-13 VITALS — BP 142/78 | HR 72 | Temp 98.4°F | Ht 77.0 in | Wt 257.0 lb

## 2017-09-13 DIAGNOSIS — J069 Acute upper respiratory infection, unspecified: Secondary | ICD-10-CM | POA: Insufficient documentation

## 2017-09-13 MED ORDER — HYDROCODONE-HOMATROPINE 5-1.5 MG/5ML PO SYRP: 5.0000 mL | ORAL_SOLUTION | Freq: Three times a day (TID) | ORAL | 0 refills | Status: DC | PRN

## 2017-09-13 NOTE — Progress Notes (Signed)
JAYVEION STALLING - 71 y.o. male MRN [id number]  Date of birth: 1945-11-28  SUBJECTIVE:  Including CC & ROS.  Chief Complaint  Patient presents with  . Cough    Uzair Godley is a 71 y.o. male that is presenting with a cough. Cough has been ongoing for three days. Denies fevers or chills and headaches. Cough worsens at night. Has been coughing up mucous. He has been around people at work with similar symptoms. Feels like his symptoms are worse or staying the same. Symptoms at worse at night. Has taken mucinex for the cough. His ribs are sore from all of the coughing.   CT of his chest from November 2018 showed apparent new 6 mm nodular opacity in the right upper lobe apical segment. Has stable nodule opacity and apical segment on the right. No long edema or consolidation.   Review of Systems  Constitutional: Negative for fever.  HENT: Positive for postnasal drip and rhinorrhea. Negative for ear pain and sinus pain.   Respiratory: Positive for cough. Negative for shortness of breath.   Cardiovascular: Negative for chest pain.  Gastrointestinal: Negative for abdominal pain.  Musculoskeletal: Negative for gait problem.  Neurological: Negative for weakness.  Hematological: Negative for adenopathy.  Psychiatric/Behavioral: Negative for agitation.    HISTORY: Past Medical, Surgical, Social, and Family History Reviewed & Updated per EMR.   Pertinent Historical Findings include:  Past Medical History:  Diagnosis Date  . Anxiety   . Atrial bigeminy 2008   Dr. Caryl Comes  . Atrial fibrillation (HCC) flutter   . BPH (benign prostatic hypertrophy) 03/21/2013  . CAD (coronary artery disease)    LHC (10/15):  pLAD 20%, mCFX 20%, pRCA 20%, PDA 20%, EF 40%  . Chronic insomnia   . DEPRESSION 03/15/2009  . DIVERTICULOSIS, COLON 03/15/2009  . Dysrhythmia    seen by Dr Caryl Comes   . 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 Fatty liver   . GERD 03/15/2009   no medications now  . YQIHKVQQ(595.6)     "monthly" (03/06/2013)  . Hx of adenomatous colonic polyps 03/15/2009  . Hx of cardiovascular stress test    ETT-Myoview (9/15):  Inferior infarct, no ischemia, EF 41% - Intermediate Risk  . Hyperlipidemia 02/10/2011  . NEPHROLITHIASIS, HX OF 01/28/2010  . NICM (nonischemic cardiomyopathy) ([place])    Echo (10/15):  Mild LVH, EF 35-40%, diff HK  . Pneumonia ~ 1950  . Sleep apnea     Past Surgical History:  Procedure Laterality Date  . APPENDECTOMY    . ATRIAL FLUTTER ABLATION  03/06/2013  . ATRIAL FLUTTER ABLATION N/A 03/06/2013   Procedure: ATRIAL FLUTTER ABLATION;  Surgeon: Deboraha Sprang, MD;  Location: [facility] CATH LAB;  Service: Cardiovascular;  Laterality: N/A;  . CARDIOVERSION N/A 02/06/2013   Procedure: CARDIOVERSION;  Surgeon: Evans Lance, MD;  Location: [facility];  Service: Cardiovascular;  Laterality: N/A;  . CHOLECYSTECTOMY  2006  . INGUINAL HERNIA REPAIR Right 1986  . LEFT HEART CATHETERIZATION WITH CORONARY ANGIOGRAM N/A 06/19/2014   Procedure: LEFT HEART CATHETERIZATION WITH CORONARY ANGIOGRAM;  Surgeon: Burnell Blanks, MD;  Location: [facility] CATH LAB;  Service: Cardiovascular;  Laterality: N/A;  . TEE WITHOUT CARDIOVERSION N/A 03/05/2013   Procedure: TRANSESOPHAGEAL ECHOCARDIOGRAM (TEE);  Surgeon: Larey Dresser, MD;  Location: [facility];  Service: Cardiovascular;  Laterality: N/A;  Jeannene Patella Greggory Brandy  . TONSILLECTOMY AND ADENOIDECTOMY  ~ 1952  . TOTAL KNEE ARTHROPLASTY Left 02/22/2016   Procedure: TOTAL KNEE ARTHROPLASTY;  Surgeon: Melrose Nakayama, MD;  Location: [facility];  Service: Orthopedics;  Laterality: Left;    Allergies  Allergen Reactions  . Lisinopril Swelling    Tongue swelling  . Xarelto [Rivaroxaban] Swelling    Tongue swelling  . Lipitor [Atorvastatin] Other (See Comments)    Mental foggy    Family History  Problem Relation Age of Onset  . Lung cancer Mother 64  . Colon cancer Mother   . Cancer Mother   . Cancer Maternal Grandmother   . CAD Neg Hx   . Heart  attack Neg Hx   . Stroke Neg Hx   . Diabetes Neg Hx      Social History   Socioeconomic History  . Marital status: Married    Spouse name: Not on file  . Number of children: 2  . Years of education: Not on file  . Highest education level: Not on file  Social Needs  . Financial resource strain: Not on file  . Food insecurity - worry: Not on file  . Food insecurity - inability: Not on file  . Transportation needs - medical: Not on file  . Transportation needs - non-medical: Not on file  Occupational History  . Occupation: IT consultant Rep for [employer]  . Smoking status: Former Smoker    Packs/day: 1.00    Years: 13.00    Pack years: 13.00    Types: Cigarettes    Last attempt to quit: 12/25/1979    Years since quitting: 37.7  . Smokeless tobacco: Never Used  Substance and Sexual Activity  . Alcohol use: Yes    Alcohol/week: 0.6 oz    Types: 1 Cans of beer per week    Comment: 03/06/2013 "glass of wine maybe once a month; glass of beer/wk"  . Drug use: No  . Sexual activity: Not Currently  Other Topics Concern  . Not on file  Social History Narrative   Step daughter and 2 64 yo twins live with      PHYSICAL EXAM:  VS: BP (!) 142/78 (BP Location: Left Arm, Patient Position: Sitting, Cuff Size: Normal)   Pulse 72   Temp 98.4 F (36.9 C) (Oral)   Ht 6\' 5"  (1.956 m)   Wt 257 lb (116.6 kg)   SpO2 98%   BMI 30.48 kg/m  Physical Exam Gen: NAD, alert, cooperative with exam,  ENT: normal lips, normal nasal mucosa, tympanic membranes clear and intact bilaterally, normal oropharynx, no cervical lymphadenopathy Eye: normal EOM, normal conjunctiva and lids CV:  no edema, +2 pedal pulses, regular rate and rhythm, S1-S2   Resp: no accessory muscle use, non-labored, clear to auscultation bilaterally, no crackles or wheezes GI: no masses or tenderness, no hernia  Skin: no rashes, no areas of induration  Neuro: normal tone, normal sensation to touch Psych:   normal insight, alert and oriented MSK: Normal gait, normal strength       ASSESSMENT & PLAN:   Upper respiratory tract infection Likely viral in nature - counseled on supportive care - provided hycodan

## 2017-09-13 NOTE — Assessment & Plan Note (Signed)
Likely viral in nature - counseled on supportive care - provided hycodan

## 2017-09-13 NOTE — Patient Instructions (Signed)
Please try things such as zyrtec-D or allegra-D which is an antihistamine and decongestant.   Please try afrin which will help with nasal congestion but use for only three days.   Please also try using a netti pot on a regular occasion.  Honey can help with a sore throat.   Delsym can help with cough as well.

## 2017-09-27 DIAGNOSIS — L570 Actinic keratosis: Secondary | ICD-10-CM | POA: Diagnosis not present

## 2017-09-27 DIAGNOSIS — L249 Irritant contact dermatitis, unspecified cause: Secondary | ICD-10-CM | POA: Diagnosis not present

## 2017-09-27 DIAGNOSIS — B078 Other viral warts: Secondary | ICD-10-CM | POA: Diagnosis not present

## 2017-09-27 DIAGNOSIS — L820 Inflamed seborrheic keratosis: Secondary | ICD-10-CM | POA: Diagnosis not present

## 2017-11-07 DIAGNOSIS — H5021 Vertical strabismus, right eye: Secondary | ICD-10-CM | POA: Diagnosis not present

## 2017-11-07 DIAGNOSIS — H43813 Vitreous degeneration, bilateral: Secondary | ICD-10-CM | POA: Diagnosis not present

## 2017-11-07 DIAGNOSIS — H5022 Vertical strabismus, left eye: Secondary | ICD-10-CM | POA: Diagnosis not present

## 2017-11-07 DIAGNOSIS — H4312 Vitreous hemorrhage, left eye: Secondary | ICD-10-CM | POA: Diagnosis not present

## 2017-11-07 DIAGNOSIS — H2513 Age-related nuclear cataract, bilateral: Secondary | ICD-10-CM | POA: Diagnosis not present

## 2017-11-28 ENCOUNTER — Telehealth: Payer: Self-pay | Admitting: Internal Medicine

## 2017-11-28 NOTE — Telephone Encounter (Signed)
Copied from [place] [id number]. Topic: Quick Communication - Rx Refill/Question >> Nov 28, 2017  1:55 PM Scherrie Gerlach wrote: Medication:  sildenafil (VIAGRA) 100 MG tablet   Pt would like a Rx for this sent to the [facility] in: [facility]  Address: [address] [place] [address]  Phone: [phone] Fax:  [phone]  pt states Dr Jenny Reichmann wrote for him last year.

## 2017-11-28 NOTE — Telephone Encounter (Signed)
Med is not on med list pls advise../lmb 

## 2017-11-29 MED ORDER — SILDENAFIL CITRATE 100 MG PO TABS: 50.0000 mg | ORAL_TABLET | Freq: Every day | ORAL | 11 refills | Status: DC | PRN

## 2017-11-29 NOTE — Telephone Encounter (Signed)
Done erx, but inform pt he should Never take the NTG and Viagra on the same day

## 2017-11-29 NOTE — Telephone Encounter (Signed)
Called pt no answer LMOM w/MD response../lmb 

## 2018-04-08 ENCOUNTER — Other Ambulatory Visit: Payer: Self-pay | Admitting: Internal Medicine

## 2019-02-11 ENCOUNTER — Telehealth: Payer: Self-pay

## 2019-02-11 NOTE — Telephone Encounter (Signed)
I called and left patient a message about scheduling appointment. He is on Dr. Olin Pia recall schedule and Dr. Caryl Comes has openings tomorrow.

## 2019-02-11 NOTE — Telephone Encounter (Signed)
New Message   Patient calling stating he moved and will no longer be coming here as a patient.

## 2019-03-31 IMAGING — CT CT CHEST W/O CM
2 of 3 series · 15 of 36 positions shown, 18 images · non-contrast
Comparison: March 10, 2016

CLINICAL DATA: Pulmonary nodules

EXAM:
CT CHEST WITHOUT CONTRAST
TECHNIQUE: Multidetector CT imaging of the chest was performed following the
standard protocol without IV contrast.

[Series 2: thorax · axial · 0.87mm/px · z∈[-394,-68]mm · 12 of 193 slices shown, 15 images]
[im 15/193  mediastinal]
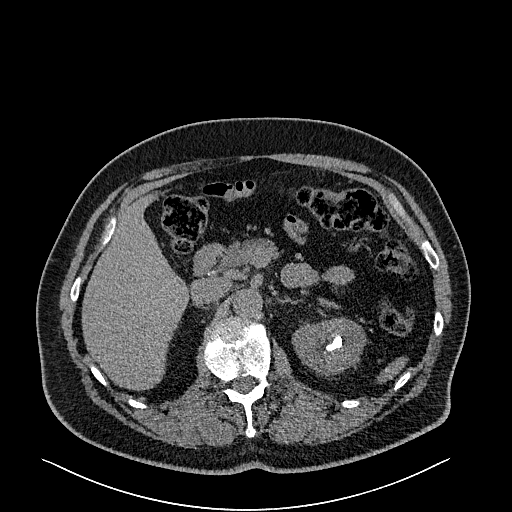
[im 15/193  lung]
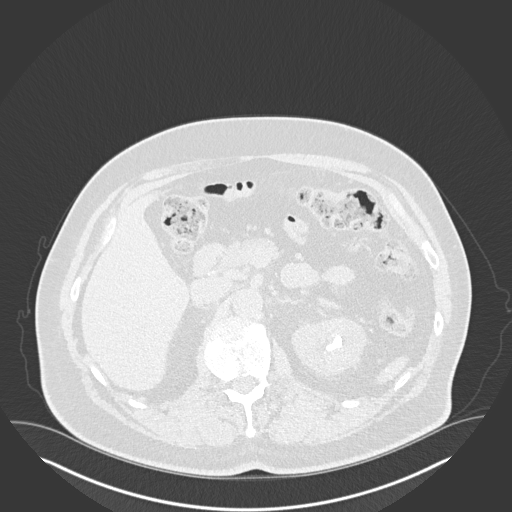
[im 29/193  lung]
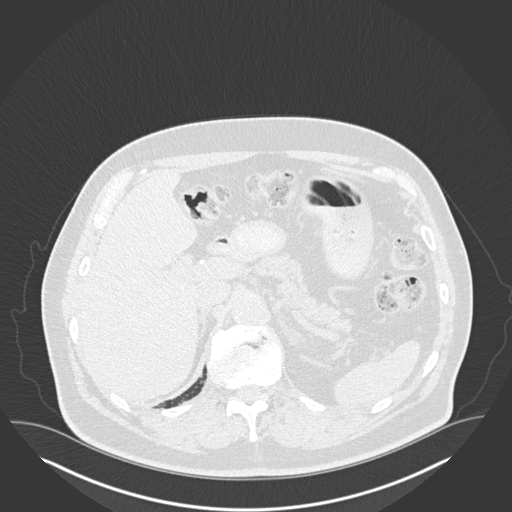
[im 43/193  lung]
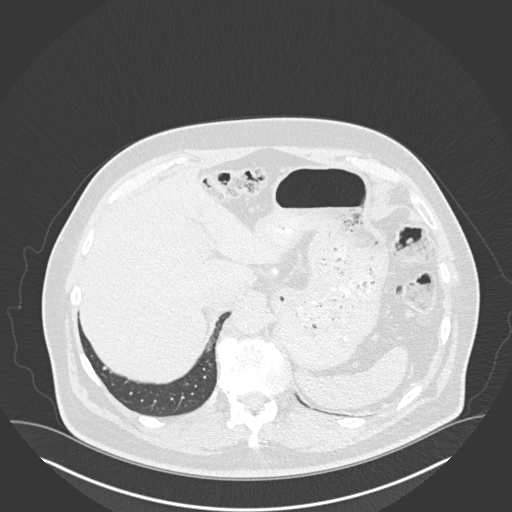
[im 57/193  lung]
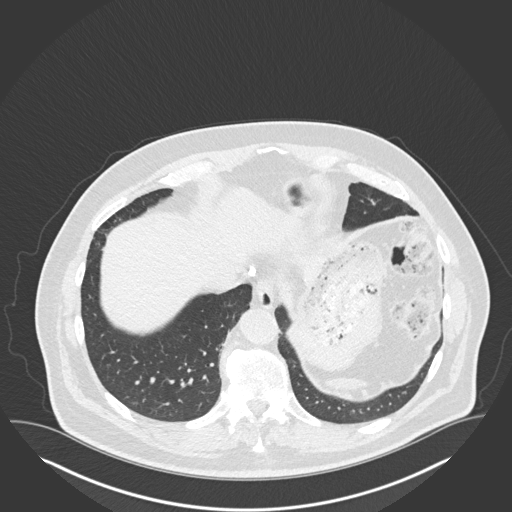
[im 72/193  mediastinal]
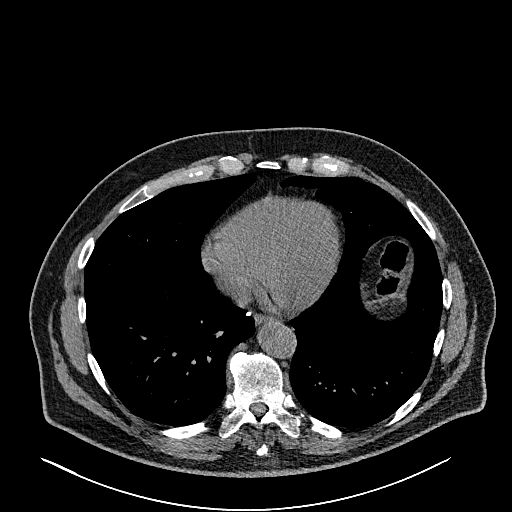
[im 72/193  lung]
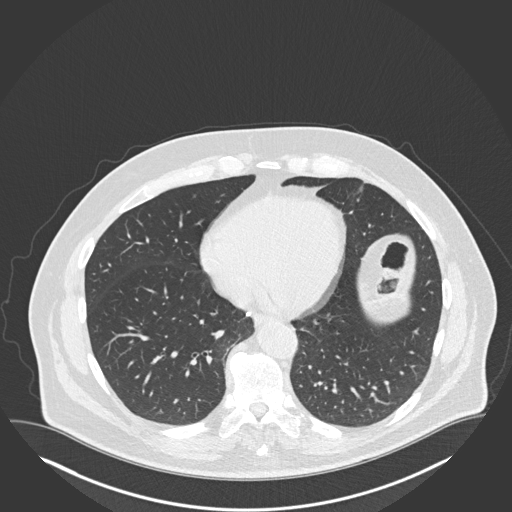
[im 86/193  lung]
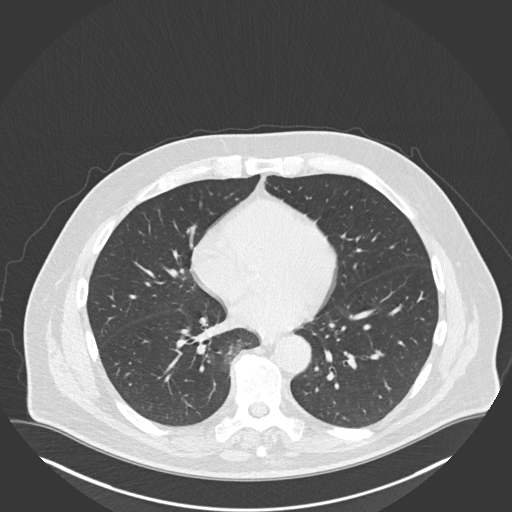
[im 107/193  lung]
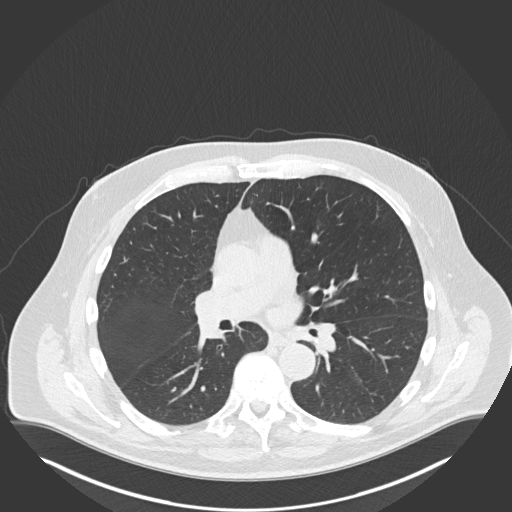
[im 121/193  lung]
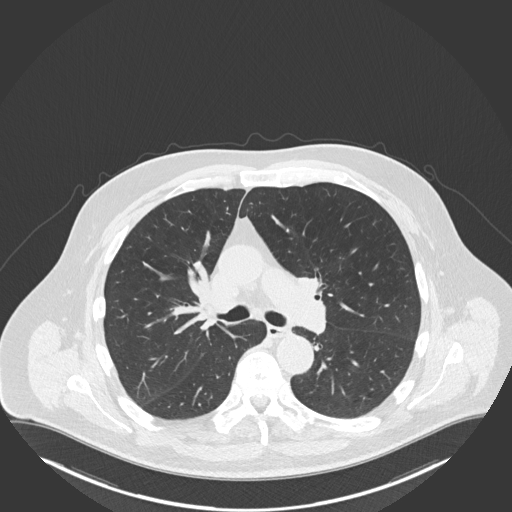
[im 136/193  mediastinal]
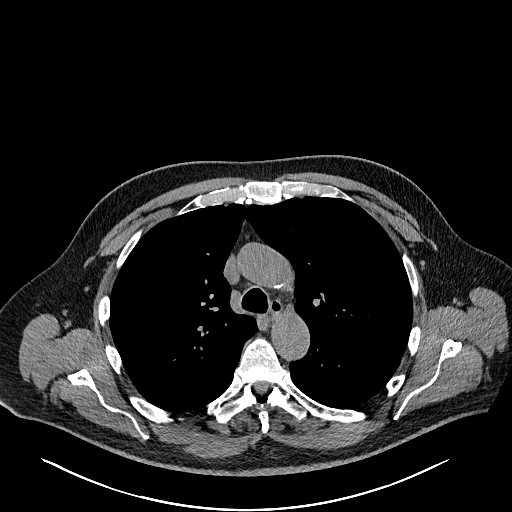
[im 136/193  lung]
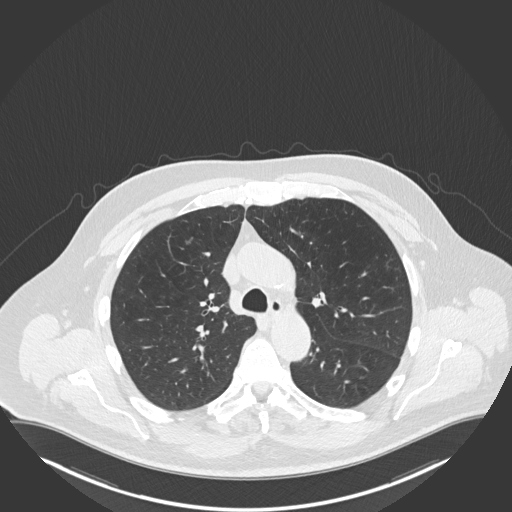
[im 150/193  lung]
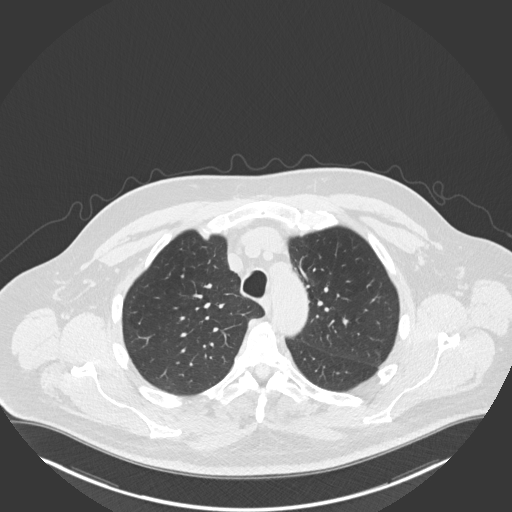
[im 164/193  lung]
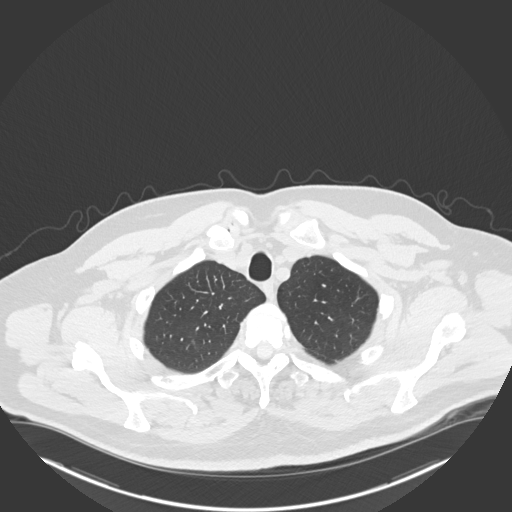
[im 178/193  lung]
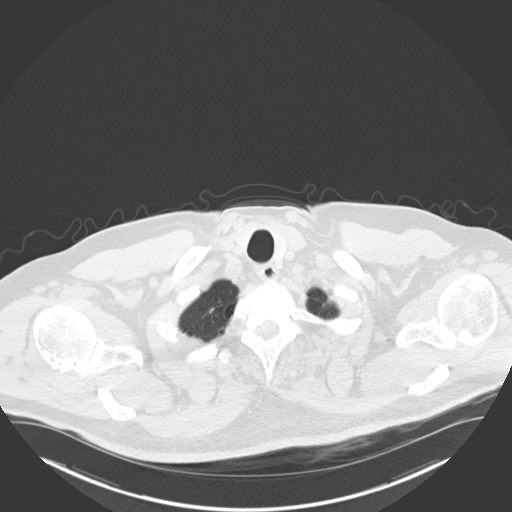

[Series 5: coronal · coronal · 0.77mm/px · 3 of 139 slices shown]
[im 28/139  lung]
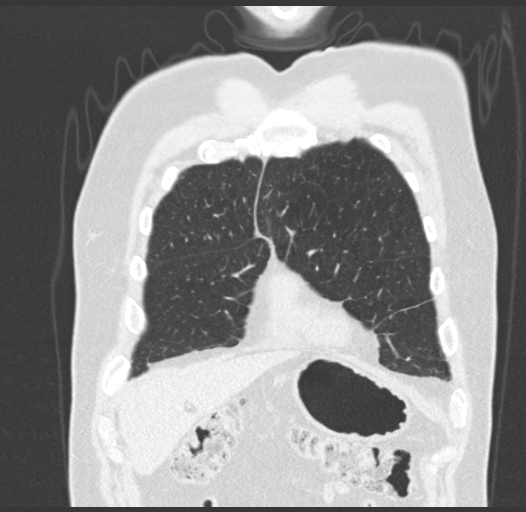
[im 56/139  lung]
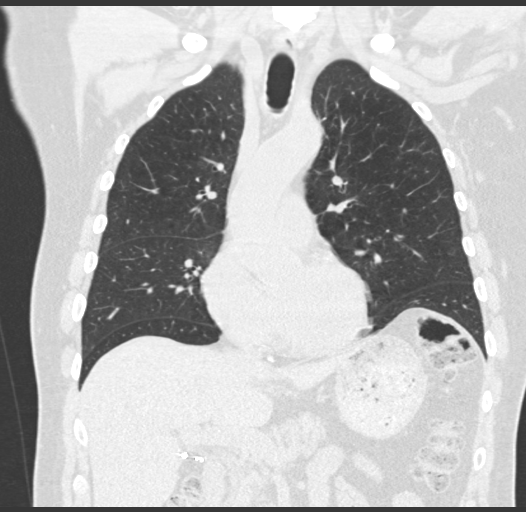
[im 83/139  lung]
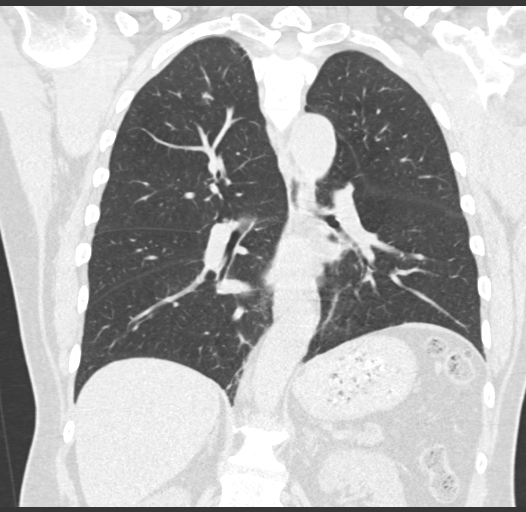

[15 of 36 positions shown; findings below may reference images not displayed]

FINDINGS: Cardiovascular: There is no appreciable thoracic aortic aneurysm.
There is mild calcification in the proximal right common carotid
artery. Other visualized great vessels appear unremarkable on this
noncontrast enhanced study. There are foci of thoracic aortic
atherosclerotic calcification. There are foci of coronary artery
calcification. Pericardium is not appreciably thickened.

Mediastinum/Nodes: Thyroid appears unremarkable. There are scattered
small calcified lymph nodes consistent with prior granulomatous
disease. There is no thoracic lymph node enlargement. No esophageal
lesions are evident.

Lungs/Pleura: On axial slice 18 series 3, there is a nodular opacity
in the apical segment of the right upper lobe measuring 7 x 4 mm,
stable. There is an apparent new nodular opacity in the apical
segment right upper lobe slightly more inferior measuring 7 x 5 mm,
seen on axial slice 36 series 3. There is nearby slight scarring.
The nodular opacity described in this vicinity on the previous study
is less apparent and felt to represent scarring. There is a stable 2
mm nodular opacity in the posterior segment right lower lobe seen on
axial slice 151 series 3. There is slight scarring in the inferior
lingula. There is no edema or consolidation. No appreciable pleural
effusion or pleural thickening evident.

Upper Abdomen: Gallbladder is absent. There is atherosclerotic
calcification in the aorta. There is a calculus in the upper pole of
the left kidney, nonobstructing, measuring 1.7 x 1.1 cm. Visualized
upper abdominal structures appear unremarkable.

Musculoskeletal: There is degenerative change in the thoracic spine.
There are no blastic or lytic bone lesions evident.
IMPRESSION: 1. There is an apparent new 6 mm nodular opacity in the right upper
lobe apical segment. Non-contrast chest CT at 6-12 months is
recommended. If the nodule is stable at time of repeat CT, then
future CT at 18-24 months (from today's scan) is considered optional
for low-risk patients, but is recommended for high-risk patients.
This recommendation follows the consensus statement: Guidelines for
Management of Incidental Pulmonary Nodules Detected on CT Images:
is mild nearby apparent scarring.

2. There is a stable nodular opacity in the apical segment right
upper lobe measuring 7 x 4 mm.

3. No lung edema or consolidation. No pleural effusion or pleural
thickening.

4. No adenopathy by size criteria. There are scattered subcentimeter
calcified lymph nodes consistent with prior granulomatous disease.

5. Foci of aortic and coronary artery calcification. Mild
calcification in the right common carotid artery.

6. Nonobstructing calculus upper pole left kidney measuring 1.7 x
1.1 cm.

7.  Gallbladder absent.

Aortic Atherosclerosis ([id number]-ZRA.A).

## 2023-12-05 NOTE — Telephone Encounter (Signed)
SNF resident.  

## 2023-12-07 ENCOUNTER — Ambulatory Visit: Payer: Self-pay | Admitting: Family

## 2023-12-07 ENCOUNTER — Encounter: Payer: Self-pay | Admitting: Family

## 2023-12-07 VITALS — BP 122/74 | HR 83 | Temp 97.6°F | Resp 18 | Ht 76.0 in | Wt 228.6 lb

## 2023-12-07 DIAGNOSIS — E1165 Type 2 diabetes mellitus with hyperglycemia: Secondary | ICD-10-CM

## 2023-12-07 DIAGNOSIS — R413 Other amnesia: Secondary | ICD-10-CM | POA: Diagnosis not present

## 2023-12-07 DIAGNOSIS — I482 Chronic atrial fibrillation, unspecified: Secondary | ICD-10-CM

## 2023-12-07 DIAGNOSIS — M25561 Pain in right knee: Secondary | ICD-10-CM

## 2023-12-07 DIAGNOSIS — N400 Enlarged prostate without lower urinary tract symptoms: Secondary | ICD-10-CM

## 2023-12-07 DIAGNOSIS — R911 Solitary pulmonary nodule: Secondary | ICD-10-CM

## 2023-12-07 DIAGNOSIS — R350 Frequency of micturition: Secondary | ICD-10-CM

## 2023-12-07 DIAGNOSIS — Z87891 Personal history of nicotine dependence: Secondary | ICD-10-CM

## 2023-12-07 DIAGNOSIS — G8929 Other chronic pain: Secondary | ICD-10-CM

## 2023-12-07 DIAGNOSIS — J310 Chronic rhinitis: Secondary | ICD-10-CM

## 2023-12-07 LAB — POCT URINALYSIS DIPSTICK
Blood, UA: NEGATIVE
Glucose, UA: NEGATIVE
Ketones, UA: NEGATIVE
Leukocytes, UA: NEGATIVE
Nitrite, UA: NEGATIVE
Protein, UA: POSITIVE — AB
Spec Grav, UA: 1.025 (ref 1.010–1.025)
Urobilinogen, UA: 0.2 U/dL
pH, UA: 5 (ref 5.0–8.0)

## 2023-12-08 LAB — URINE CULTURE
MICRO NUMBER:: 16233056
Result:: NO GROWTH
SPECIMEN QUALITY:: ADEQUATE

## 2023-12-14 LAB — COMPLETE METABOLIC PANEL WITHOUT GFR
AG Ratio: 1.6 (calc) (ref 1.0–2.5)
ALT: 14 U/L (ref 9–46)
AST: 14 U/L (ref 10–35)
Albumin: 4 g/dL (ref 3.6–5.1)
Alkaline phosphatase (APISO): 79 U/L (ref 35–144)
BUN: 24 mg/dL (ref 7–25)
CO2: 30 mmol/L (ref 20–32)
Calcium: 10.2 mg/dL (ref 8.6–10.3)
Chloride: 108 mmol/L (ref 98–110)
Creat: 1.23 mg/dL (ref 0.70–1.28)
Globulin: 2.5 g/dL (ref 1.9–3.7)
Glucose, Bld: 116 mg/dL — ABNORMAL HIGH (ref 65–99)
Potassium: 5.7 mmol/L — ABNORMAL HIGH (ref 3.5–5.3)
Sodium: 144 mmol/L (ref 135–146)
Total Bilirubin: 0.5 mg/dL (ref 0.2–1.2)
Total Protein: 6.5 g/dL (ref 6.1–8.1)

## 2023-12-14 LAB — LIPID PANEL
Cholesterol: 142 mg/dL (ref <200)
HDL: 39 mg/dL — ABNORMAL LOW (ref >=40)
LDL Cholesterol (Calc): 84 mg/dL
Non-HDL Cholesterol (Calc): 103 mg/dL (ref <130)
Total CHOL/HDL Ratio: 3.6 (calc) (ref <5.0)
Triglycerides: 94 mg/dL (ref <150)

## 2023-12-14 LAB — HEMOGLOBIN A1C
Hgb A1c MFr Bld: 6.4 %{Hb} — ABNORMAL HIGH (ref <5.7)
Mean Plasma Glucose: 137 mg/dL
eAG (mmol/L): 7.6 mmol/L

## 2023-12-14 LAB — URINE CULTURE
MICRO NUMBER:: 16257370
Result:: NO GROWTH
SPECIMEN QUALITY:: ADEQUATE

## 2023-12-14 LAB — VITAMIN B12: Vitamin B-12: 625 pg/mL (ref 200–1100)

## 2023-12-14 LAB — CBC WITH DIFFERENTIAL/PLATELET
Absolute Lymphocytes: 2020 {cells}/uL (ref 850–3900)
Absolute Monocytes: 696 {cells}/uL (ref 200–950)
Basophils Absolute: 104 {cells}/uL (ref 0–200)
Basophils Relative: 1.4 %
Eosinophils Absolute: 252 {cells}/uL (ref 15–500)
Eosinophils Relative: 3.4 %
HCT: 46.1 % (ref 38.5–50.0)
Hemoglobin: 15.4 g/dL (ref 13.2–17.1)
MCH: 29.3 pg (ref 27.0–33.0)
MCHC: 33.4 g/dL (ref 32.0–36.0)
MCV: 87.6 fL (ref 80.0–100.0)
MPV: 10 fL (ref 7.5–12.5)
Monocytes Relative: 9.4 %
Neutro Abs: 4329 {cells}/uL (ref 1500–7800)
Neutrophils Relative %: 58.5 %
Platelets: 232 10*3/uL (ref 140–400)
RBC: 5.26 10*6/uL (ref 4.20–5.80)
RDW: 12.9 % (ref 11.0–15.0)
Total Lymphocyte: 27.3 %
WBC: 7.4 10*3/uL (ref 3.8–10.8)

## 2023-12-14 LAB — TSH: TSH: 2.46 m[IU]/L (ref 0.40–4.50)

## 2023-12-16 NOTE — Progress Notes (Signed)
 Provider: Richarda Blade FNP-C   Kyndahl Jablon, Donalee Citrin, NP  Patient Care Team: Donnisha Besecker, Donalee Citrin, NP as PCP - General (Family Medicine)  Extended Emergency Contact Information Primary Emergency Contact: Burley Saver Address: [address]          [place], [place] [address] [place] Phone: [phone] Relation: Daughter Secondary Emergency Contact: Shawnie Pons, [place] [address] [place] of [place] Mobile Phone: [phone] Relation: Other  Code Status:  Full Code  Goals of care: Advanced Directive information    12/07/2023    2:29 PM  Advanced Directives  Does Patient Have a Medical Advance Directive? Yes  Type of Estate agent of [place];Living will  Does patient want to make changes to medical advance directive? No - Patient declined  Copy of Healthcare Power of Attorney in Chart? Yes - validated most recent copy scanned in chart (See row information)     Chief Complaint  Patient presents with   Establish Care    New patient appointment.     Discussed the use of AI scribe software for clinical note transcription with the patient, who gave verbal consent to proceed.  History of Present Illness   Dennis Hill is a 78 year old male with dementia who presents with weight loss and cognitive decline. He is accompanied by his daughter, who is his primary caregiver.  He has experienced significant weight loss, losing 10 pounds in the last two weeks, dropping from 257 pounds to 228.6 pounds. His daughter notes that while living in [place], he was not eating much, but he is now ensuring he eats more regularly. He typically consumes three meals a day with a snack, whereas previously he only ate twice a day unless he stopped for fast food. No nausea or vomiting is reported, and he does not use protein supplements.  He has been diagnosed with dementia, with a significant cognitive decline over the past two  years. He is currently on Aricept 5 mg. His daughter notes issues with short-term memory, confusion with timelines, and spatial awareness. He has not had a recent MRI or CT scan. He also experiences stumbling and shuffling of feet but has not fallen since moving back with his family. He walks about a mile and a half daily, which he finds beneficial.  He experiences urinary frequency and has been diagnosed with benign prostatic hyperplasia. No symptoms of a urinary tract infection are present.  He has a history of atrial fibrillation for which he underwent an ablation over two years ago. He also had a left knee replacement and experiences pain in the right knee, especially when not careful with it. He has not seen an orthopedic specialist since moving back. He has a history of gout in one toe, which is not currently bothersome.  He has a history of a lung nodule, which was monitored but not deemed cancerous. He is a former smoker, having quit many years ago.  He has a history of high blood sugars but has not been diagnosed with diabetes. No history of hypertension, anxiety, or depression. He is not currently taking albuterol, nitroglycerin, Flonase, Miralax, gabapentin, hydrocodone, Viagra, or tamsulosin. He takes B complex vitamins, CoQ10, and donepezil.  He has a persistent runny nose since returning to [place], which he attributes to sinus issues and possibly allergies. No epistaxis.  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  He has varicose veins that do not swell or cause pain. He has a spot behind his left ear that he would like removed. He has a history of kidney stones and reports occasional pain in the area where they were previously removed.   Past Medical History:  Diagnosis Date   Anxiety    Arthritis    Atrial bigeminy 09/18/2006   Dr. 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   Fatty liver    GERD 03/15/2009   no medications now   Headache(784.0)    "monthly" (03/06/2013)   Hx of adenomatous colonic polyps 03/15/2009   Hx of cardiovascular stress test    ETT-Myoview (9/15):  Inferior infarct, no ischemia, EF 41% - Intermediate Risk   Hyperlipidemia 02/10/2011   NEPHROLITHIASIS, HX OF 01/28/2010   NICM (nonischemic cardiomyopathy) (HCC)    Echo (10/15):  Mild LVH, EF 35-40%, diff HK   Pneumonia ~ 1950   Sleep apnea    Past Surgical History:  Procedure Laterality Date   APPENDECTOMY     ATRIAL FLUTTER ABLATION  03/06/2013   ATRIAL FLUTTER ABLATION N/A 03/06/2013   Procedure: ATRIAL FLUTTER ABLATION;  Surgeon: Duke Salvia, MD;  Location: [facility] CATH LAB;  Service: Cardiovascular;  Laterality: N/A;   CARDIOVERSION N/A 02/06/2013   Procedure: CARDIOVERSION;  Surgeon: Marinus Maw, MD;  Location: [facility] ENDOSCOPY;  Service: Cardiovascular;  Laterality: N/A;   CHOLECYSTECTOMY  09/18/2004   INGUINAL HERNIA REPAIR Right 09/18/1984   JOINT REPLACEMENT     LEFT HEART CATHETERIZATION WITH CORONARY ANGIOGRAM N/A 06/19/2014   Procedure: LEFT HEART CATHETERIZATION WITH CORONARY ANGIOGRAM;  Surgeon: Kathleene Hazel, MD;  Location: [facility] CATH LAB;  Service: Cardiovascular;  Laterality: N/A;   TEE WITHOUT CARDIOVERSION N/A 03/05/2013   Procedure: TRANSESOPHAGEAL ECHOCARDIOGRAM (TEE);  Surgeon: Laurey Morale, MD;  Location[facility] ENDOSCOPY;  Service: Cardiovascular;  Laterality: N/A;  pam Fawn Kirk   TONSILLECTOMY AND ADENOIDECTOMY  ~ 1952   TOTAL KNEE ARTHROPLASTY Left 02/22/2016   Procedure: TOTAL KNEE ARTHROPLASTY;  Surgeon: Marcene Corning, MD;  Location: [facility] OR;  Service: Orthopedics;  Laterality: Left;    Allergies  Allergen  Reactions   Lisinopril Swelling    Tongue swelling   Xarelto [Rivaroxaban] Swelling    Tongue swelling   Lipitor [Atorvastatin] Other (See Comments)    Mental foggy    Allergies as of 12/07/2023       Reactions   Lisinopril Swelling   Tongue swelling   Xarelto [rivaroxaban] Swelling   Tongue swelling   Lipitor [atorvastatin] Other (See Comments)   Mental foggy        Medication List        Accurate as of December 07, 2023 11:59 PM. If you have any questions, ask your nurse or doctor.          STOP taking these medications    albuterol 108 (90 Base) MCG/ACT inhaler Commonly known as: VENTOLIN HFA Stopped by: Erikka Follmer C Shakir Petrosino   fluticasone 50 MCG/ACT nasal spray Commonly known as: FLONASE Stopped by: Donalee Citrin Violetta Lavalle   gabapentin 100 MG capsule Commonly known as: NEURONTIN Stopped by: Carilyn Goodpasture  C Dylynn Ketner   HYDROcodone-homatropine 5-1.5 MG/5ML syrup Commonly known as: HYCODAN Stopped by: Donalee Citrin Tramar Brueckner   nitroGLYCERIN 0.4 MG SL tablet Commonly known as: NITROSTAT Stopped by: Buel Molder C Jullie Arps   polyethylene glycol 17 g packet Commonly known as: MIRALAX / GLYCOLAX Stopped by: Donalee Citrin Cristan Hout   sildenafil 100 MG tablet Commonly known as: Viagra Stopped by: Donalee Citrin Lakeysha Slutsky   tamsulosin 0.4 MG Caps capsule Commonly known as: FLOMAX Stopped by: Donalee Citrin Shatona Andujar       TAKE these medications    b complex vitamins capsule Take 1 capsule by mouth daily.   co-enzyme Q-10 30 MG capsule Take 50 mg by mouth daily.   diclofenac sodium 1 % Gel Commonly known as: Voltaren Apply 4 g topically 4 (four) times daily as needed.   donepezil 5 MG tablet Commonly known as: ARICEPT Take 5 mg by mouth at bedtime.   ibuprofen 200 MG tablet Commonly known as: ADVIL Take 600 mg by mouth every 6 (six) hours as needed for moderate pain.        Review of Systems  Constitutional:  Negative for appetite change, chills, fatigue, fever and unexpected weight change.  HENT:   Positive for rhinorrhea. Negative for congestion, dental problem, ear discharge, ear pain, facial swelling, hearing loss, nosebleeds, postnasal drip, sinus pressure, sinus pain, sneezing, sore throat, tinnitus and trouble swallowing.        Chronic runny nose   Eyes:  Negative for pain, discharge, redness, itching and visual disturbance.  Respiratory:  Negative for cough, chest tightness, shortness of breath and wheezing.   Cardiovascular:  Negative for chest pain, palpitations and leg swelling.  Gastrointestinal:  Negative for abdominal distention, abdominal pain, blood in stool, constipation, diarrhea, nausea and vomiting.  Endocrine: Negative for cold intolerance, heat intolerance, polydipsia, polyphagia and polyuria.  Genitourinary:  Negative for difficulty urinating, dysuria, flank pain, frequency and urgency.  Musculoskeletal:  Positive for arthralgias. Negative for back pain, gait problem, joint swelling, myalgias, neck pain and neck stiffness.  Skin:  Negative for color change, pallor, rash and wound.  Neurological:  Negative for dizziness, syncope, speech difficulty, weakness, light-headedness, numbness and headaches.  Hematological:  Does not bruise/bleed easily.  Psychiatric/Behavioral:  Negative for agitation, behavioral problems, confusion, hallucinations, self-injury, sleep disturbance and suicidal ideas. The patient is not nervous/anxious.     Immunization History  Administered Date(s) Administered   Influenza, High Dose Seasonal PF 07/27/2017   Moderna Sars-Covid-2 Vaccination 11/10/2019, 12/08/2019   PPD Test 02/24/2016   Pneumococcal Conjugate-13 01/09/2014   Pneumococcal Polysaccharide-23 03/15/2009   Td 03/15/2009   Pertinent  Health Maintenance Due  Topic Date Due   FOOT EXAM  01/10/2015   OPHTHALMOLOGY EXAM  04/16/2015   INFLUENZA VACCINE  12/17/2023 (Originally 04/19/2023)   HEMOGLOBIN A1C  06/14/2024   Colonoscopy  Discontinued      07/08/2013    8:41 AM  01/09/2014    8:38 AM 01/15/2015    8:38 AM 03/07/2017    3:40 PM 12/07/2023    2:27 PM  Fall Risk  Falls in the past year? No No No No 1  Was there an injury with Fall?     0  Fall Risk Category Calculator     1  Patient at Risk for Falls Due to     History of fall(s)  Fall risk Follow up     Falls evaluation completed   Functional Status Survey:    Vitals:   12/07/23 1438  BP:  122/74  Pulse: 83  Resp: 18  Temp: 97.6 F (36.4 C)  SpO2: 99%  Weight: 228 lb 9.6 oz (103.7 kg)  Height: 6\' 4"  (1.93 m)   Body mass index is 27.83 kg/m. Physical Exam VITALS: T- 97.6, P- 83, BP- 122/74, SaO2- 99% MEASUREMENTS: Weight- 228.6. GENERAL: Alert, cooperative, well developed, no acute distress. HEENT: Normocephalic, normal oropharynx, moist mucous membranes, ears normal and clean, nose normal, pupils equal, round, reactive to light, no sinus tenderness. CHEST: Clear to auscultation bilaterally, no wheezes, rhonchi, or crackles. CARDIOVASCULAR: Normal heart rate and rhythm, S1 and S2 normal without murmurs. ABDOMEN: Soft, non-tender, non-distended, without organomegaly, normal bowel sounds. EXTREMITIES: No cyanosis or edema, varicose veins present, no swelling. NEUROLOGICAL: Cranial nerves grossly intact, moves all extremities without gross motor or sensory deficit. SKIN: Lesion behind left ear, dry skin present. Psychiatry/Behavioral: Mood stable, Memory loss   Labs reviewed: Recent Labs    12/13/23 0824  NA 144  K 5.7*  CL 108  CO2 30  GLUCOSE 116*  BUN 24  CREATININE 1.23  CALCIUM 10.2   Recent Labs    12/13/23 0824  AST 14  ALT 14  BILITOT 0.5  PROT 6.5   Recent Labs    12/13/23 0824  WBC 7.4  NEUTROABS 4,329  HGB 15.4  HCT 46.1  MCV 87.6  PLT 232   Lab Results  Component Value Date   TSH 2.46 12/13/2023   Lab Results  Component Value Date   HGBA1C 6.4 (H) 12/13/2023   Lab Results  Component Value Date   CHOL 142 12/13/2023   HDL 39 (L) 12/13/2023    LDLCALC 84 12/13/2023   LDLDIRECT 87.2 09/05/2013   TRIG 94 12/13/2023   CHOLHDL 3.6 12/13/2023    Significant Diagnostic Results in last 30 days:  No results found.  Assessment/Plan  Unintentional Weight Loss He has experienced a 10-pound weight loss over the past two weeks with a good appetite and no nausea or vomiting. Weight loss may be related to dietary changes while living in [place] or as a side effect of donepezil (Aricept). Concern exists about potential weakness from further weight loss. - Monitor weight regularly to ensure it is not trending down. - Encourage adequate nutrition and monitor for potential vitamin deficiencies, particularly vitamin B12.  Dementia He has a new diagnosis of dementia with significant cognitive decline over the past two years. Currently on donepezil 5 mg, but the effectiveness is uncertain. Further evaluation is needed to determine the type of dementia, possibly Alzheimer's, and to monitor for any contributing factors such as nutritional deficiencies. Concerns about safety have led to discontinuation of driving due to memory loss and potential risk of harm. - Refer to neurology for further evaluation and management of dementia. - Continue donepezil 5 mg and monitor for effectiveness. - Recheck vitamin B12 levels to rule out deficiency contributing to memory loss.  Right Knee Pain He is experiencing significant pain in the right knee, which worsens with activity. He has not yet seen an orthopedic specialist for this issue. - Refer to orthopedic specialist for evaluation of right knee pain.  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 Advised to use Tylenol for pain management as ibuprofen can cause gastrointestinal issues in the elderly. - Use Tylenol for arthritis pain management. - Consider using squishy balls for hand exercises to improve finger strength.  Benign Prostatic Hyperplasia (BPH) He has benign prostatic  hyperplasia with increased urinary frequency. No indication of a  urinary tract infection, but a urinalysis will be performed to rule out infection. - Perform urinalysis to rule out urinary tract infection.  Chronic Rhinitis He has been experiencing a runny nose for the past two weeks since moving back to [place]. This may be related to allergies or sinus issues exacerbated by pollen and environmental changes. - Recommend Claritin once daily for allergy relief. - Consider using Flonase for additional symptom control.  Lung Nodule He has a lung nodule that was previously monitored and not considered cancerous. He is a former smoker, and there is a need to follow up with a pulmonary specialist to ensure the nodule is stable. - Refer to pulmonary specialist for evaluation and follow-up of lung nodule.  General Health Maintenance He is due for a tetanus booster and has declined the flu, shingles, and COVID vaccines. He has not seen an eye doctor recently and needs to establish care with one. - Administer tetanus booster at the pharmacy. - Establish care with an eye doctor for routine eye examination.  Follow-up He requires follow-up appointments and tests to manage his various health conditions. - Schedule follow- up appointment in six months. - Perform fasting blood work at a local lab. - Obtain urine sample for urinalysis.   Family/ staff Communication: Reviewed plan of care with patient verbalized understanding   Labs/tests ordered:  - POC Urinalysis Dipstick  - Urine Culture - CBC with Differential/Platelet - CMP with eGFR([facility]) - TSH - Hgb A1C - Lipid panel - Vitamin B 12  Next Appointment : Return in about 6 months (around 06/08/2024) for medical mangement of chronic issues.Marland Kitchen   Spent 49 minutes of Face to face and non-face to face with patient  >50% time spent counseling; reviewing medical record; tests; labs; documentation and developing future plan of care.   Caesar Bookman, NP

## 2023-12-17 ENCOUNTER — Other Ambulatory Visit: Payer: Self-pay | Admitting: Family

## 2023-12-17 DIAGNOSIS — R911 Solitary pulmonary nodule: Secondary | ICD-10-CM

## 2023-12-17 DIAGNOSIS — H6192 Disorder of left external ear, unspecified: Secondary | ICD-10-CM

## 2023-12-17 NOTE — Telephone Encounter (Signed)
Message routed to PCP [name], Dinah C, NP  

## 2023-12-20 ENCOUNTER — Encounter: Payer: Self-pay | Admitting: Physician Assistant

## 2023-12-20 ENCOUNTER — Ambulatory Visit: Payer: Self-pay | Admitting: Orthopedic Surgery

## 2024-01-02 LAB — HM DIABETES EYE EXAM

## 2024-01-08 NOTE — Progress Notes (Signed)
 Chief Complaint: No chief complaint on file.   History of Present Illness:  MICAHEL Hill is a 78 y.o. male who is seen in consultation from [name], Dennis [place], NP for evaluation of BPH with LUTS.   Past Medical History:  Past Medical History:  Diagnosis Date   Anxiety    Arthritis    Atrial bigeminy 09/18/2006   Dr. 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   Fatty liver    GERD 03/15/2009   no medications now   Headache(784.0)    "monthly" (03/06/2013)   Hx of adenomatous colonic polyps 03/15/2009   Hx of cardiovascular stress test    ETT-Myoview (9/15):  Inferior infarct, no ischemia, EF 41% - Intermediate Risk   Hyperlipidemia 02/10/2011   NEPHROLITHIASIS, HX OF 01/28/2010   NICM (nonischemic cardiomyopathy) (HCC)    Echo (10/15):  Mild LVH, EF 35-40%, diff HK   Pneumonia ~ 1950   Sleep apnea     Past Surgical History:  Past Surgical History:  Procedure Laterality Date   APPENDECTOMY     ATRIAL FLUTTER ABLATION  03/06/2013   ATRIAL FLUTTER ABLATION N/A 03/06/2013   Procedure: ATRIAL FLUTTER ABLATION;  Surgeon: [name], MD;  Location: [facility] CATH LAB;  Service: Cardiovascular;  Laterality: N/A;   CARDIOVERSION N/A 02/06/2013   Procedure: CARDIOVERSION;  Surgeon: [name], MD;  Location: [facility] ENDOSCOPY;  Service: Cardiovascular;  Laterality: N/A;   CHOLECYSTECTOMY  09/18/2004   INGUINAL HERNIA REPAIR Right 09/18/1984   JOINT REPLACEMENT     LEFT HEART CATHETERIZATION WITH CORONARY ANGIOGRAM N/A 06/19/2014   Procedure: LEFT HEART CATHETERIZATION WITH CORONARY ANGIOGRAM;  Surgeon: [name], MD;  Location: [facility] CATH LAB;  Service:  Cardiovascular;  Laterality: N/A;   TEE WITHOUT CARDIOVERSION N/A 03/05/2013   Procedure: TRANSESOPHAGEAL ECHOCARDIOGRAM (TEE);  Surgeon: [name], MD;  Location: [facility] ENDOSCOPY;  Service: Cardiovascular;  Laterality: N/A;  [name]   TONSILLECTOMY AND ADENOIDECTOMY  ~ 1952   TOTAL KNEE ARTHROPLASTY Left 02/22/2016   Procedure: TOTAL KNEE ARTHROPLASTY;  Surgeon: [name], MD;  Location: [facility] OR;  Service: Orthopedics;  Laterality: Left;    Allergies:  Allergies  Allergen Reactions   Lisinopril  Swelling    Tongue swelling   Xarelto  [Rivaroxaban ] Swelling    Tongue swelling   Lipitor [Atorvastatin ] Other (See Comments)    Mental foggy    Family History:  Family History  Problem Relation Age of Onset   Lung cancer Mother 76   Colon cancer Mother    Cancer Mother    Cancer Maternal Grandmother    CAD Neg Hx    Heart attack Neg Hx    Stroke Neg Hx    Diabetes Neg Hx     Social History:  Social History   Tobacco Use   Smoking status: Former    Current packs/day: 0.00    Average packs/day: 1 pack/day for 13.0 years (13.0 ttl pk-yrs)    Types: Cigarettes    Start date: 12/25/1966    Quit date: 12/25/1979    Years since quitting: 44.0   Smokeless tobacco: Never  Substance Use  Topics   Alcohol use: Yes    Alcohol/week: 1.0 standard drink of alcohol    Types: 1 Cans of beer per week    Comment: 03/06/2013 "glass of wine maybe once a month; glass of beer/wk"   Drug use: No    Review of symptoms:  Constitutional:  Negative for unexplained weight loss, night sweats, fever, chills ENT:  Negative for nose bleeds, sinus pain, painful swallowing CV:  Negative for chest pain, shortness of breath, exercise intolerance, palpitations, loss of consciousness Resp:  Negative for cough, wheezing, shortness of breath GI:  Negative for nausea, vomiting, diarrhea, bloody stools GU:  Positives noted in HPI; otherwise negative for gross hematuria, dysuria, urinary incontinence Neuro:   Negative for seizures, poor balance, limb weakness, slurred speech Psych:  Negative for lack of energy, depression, anxiety Endocrine:  Negative for polydipsia, polyuria, symptoms of hypoglycemia (dizziness, hunger, sweating) Hematologic:  Negative for anemia, purpura, petechia, prolonged or excessive bleeding, use of anticoagulants  Allergic:  Negative for difficulty breathing or choking as a result of exposure to anything; no shellfish allergy; no allergic response (rash/itch) to materials, foods  Physical exam: There were no vitals taken for this visit. GENERAL APPEARANCE:  Well appearing, well developed, well nourished, NAD HEENT: Atraumatic, Normocephalic. NECK: Normal appearance LUNGS: Normal inspiratory and expiratory excursion HEART: Regular Rate ABDOMEN: ***. GU: Phallus normal, no lesions. Scrotal skin normal. Testicles/epididymal structures normal. Meatus normal. Normal anal sphincter tone, prostate ***mL, symmetric, non nodular, non tender. EXTREMITIES: Moves all extremities well.  Without clubbing, cyanosis, or edema. NEUROLOGIC:  Alert and oriented x 3, normal gait, CN II-XII grossly intact.  MENTAL STATUS:  Appropriate. SKIN:  Warm, dry and intact.    Results: No results found for this or any previous visit (from the past 24 hours).  I have reviewed referring/prior physicians notes  I have reviewed urinalysis  I have reviewed PSA results  I have reviewed prior imaging  I have reviewed urine culture results  Assessment: ***   Plan: ***

## 2024-01-09 ENCOUNTER — Ambulatory Visit: Admitting: Urology

## 2024-01-09 ENCOUNTER — Encounter: Payer: Self-pay | Admitting: Urology

## 2024-01-09 VITALS — BP 148/81 | HR 81 | Ht 77.0 in | Wt 228.0 lb

## 2024-01-09 DIAGNOSIS — N401 Enlarged prostate with lower urinary tract symptoms: Secondary | ICD-10-CM | POA: Diagnosis not present

## 2024-01-09 DIAGNOSIS — N3941 Urge incontinence: Secondary | ICD-10-CM | POA: Diagnosis not present

## 2024-01-09 DIAGNOSIS — R350 Frequency of micturition: Secondary | ICD-10-CM

## 2024-01-09 DIAGNOSIS — R3129 Other microscopic hematuria: Secondary | ICD-10-CM

## 2024-01-09 DIAGNOSIS — N138 Other obstructive and reflux uropathy: Secondary | ICD-10-CM

## 2024-01-09 LAB — MICROSCOPIC EXAMINATION

## 2024-01-09 LAB — URINALYSIS, ROUTINE W REFLEX MICROSCOPIC
Bilirubin, UA: NEGATIVE
Glucose, UA: NEGATIVE
Leukocytes,UA: NEGATIVE
Nitrite, UA: NEGATIVE
Protein,UA: NEGATIVE
Specific Gravity, UA: 1.02 (ref 1.005–1.030)
Urobilinogen, Ur: 1 mg/dL (ref 0.2–1.0)
pH, UA: 6 (ref 5.0–7.5)

## 2024-01-09 LAB — BLADDER SCAN AMB NON-IMAGING: Scan Result: 10

## 2024-01-10 ENCOUNTER — Encounter: Payer: Self-pay | Admitting: Urology

## 2024-01-10 ENCOUNTER — Ambulatory Visit: Payer: Self-pay | Attending: Cardiology | Admitting: Cardiology

## 2024-01-10 ENCOUNTER — Encounter: Payer: Self-pay | Admitting: Cardiology

## 2024-01-10 VITALS — BP 92/64 | HR 82 | Ht 77.0 in | Wt 231.4 lb

## 2024-01-10 DIAGNOSIS — D6869 Other thrombophilia: Secondary | ICD-10-CM | POA: Diagnosis not present

## 2024-01-10 DIAGNOSIS — I480 Paroxysmal atrial fibrillation: Secondary | ICD-10-CM | POA: Diagnosis not present

## 2024-01-10 DIAGNOSIS — R42 Dizziness and giddiness: Secondary | ICD-10-CM | POA: Diagnosis not present

## 2024-01-10 DIAGNOSIS — I4892 Unspecified atrial flutter: Secondary | ICD-10-CM | POA: Diagnosis not present

## 2024-01-10 NOTE — Progress Notes (Signed)
 " Electrophysiology Office Note:   Date:  01/11/2024  ID:  Dennis Hill, DOB February 18, 1946, MRN [id number]  Primary Cardiologist: None Electrophysiologist: Fonda Kitty, MD      History of Present Illness:   Dennis Hill is a 78 y.o. male with h/o atrial fibrillation and flutter, dementia who was brought daughter today to be seen for evaluation of his atrial fibrillation.  Discussed the use of AI scribe software for clinical note transcription with the patient, who gave verbal consent to proceed.  History of Present Illness Patient relocated to the [facility] area to be near his children.  He is accompanied today by his daughter.  He lives in a senior care facility due to some memory impairment.  The main purpose of the visit today was to reestablish care with a electrophysiologist.  He has a history of atrial fibrillation and flutter, for which he previously underwent catheter ablation.  He had a recent ED visit due to some back pain/flank pain.  During that visit, EKG was done which reportedly showed atrial fibrillation.He experiences no significant palpitations, chest pain, shortness of breath, lower extremity edema. He experiences episodes of dizziness, particularly when standing, indicating a possible orthostatic component. He is not on any blood pressure medications, and his blood pressure tends to be low. He attempts to stay hydrated, though his fluid intake varies, especially on busy days.  No recent falls.  He reports a burning sensation in the tops of his legs down to his feet when lying down at night, described as neuropathy-like. This has been occurring for two weeks and is bothersome enough to make him remove the covers due to warmth.  Review of systems complete and found to be negative unless listed in HPI.   EP Information / Studies Reviewed:    EKG is ordered today. Personal review as below.  EKG Interpretation Date/Time:  Thursday January 10 2024 14:18:25  EDT Ventricular Rate:  82 PR Interval:  200 QRS Duration:  92 QT Interval:  354 QTC Calculation: 413 R Axis:   4  Text Interpretation: Sinus rhythm with Premature atrial complexes in a pattern of bigeminy Nonspecific T wave abnormality When compared with ECG of 10-Mar-2016 14:14, Premature atrial complexes are now Present Nonspecific T wave abnormality now evident in Lateral leads Confirmed by Kitty Fonda [phone]) on 01/11/2024 3:48:25 PM   Echo 06/2015:  - Left ventricle: The cavity size was normal. There was mild    concentric hypertrophy. Systolic function was mildly to    moderately reduced. The estimated ejection fraction was in the    range of 40% to 45%. Diffuse hypokinesis. The study is not    technically sufficient to allow evaluation of LV diastolic    function.  - Mitral valve: Transvalvular velocity was within the normal range.    There was no evidence for stenosis. There was no regurgitation.  - Left atrium: The atrium was moderately dilated.  - Right ventricle: The cavity size was normal. Wall thickness was    normal. Systolic function was normal.  - Atrial septum: No defect or patent foramen ovale was identified.  - Tricuspid valve: There was trivial regurgitation.  - Pulmonary arteries: PA peak pressure: 24 mm Hg (S).  - Inferior vena cava: The vessel was normal in size. The    respirophasic diameter changes were in the normal range (>= 50%),    consistent with normal central venous pressure.   Risk Assessment/Calculations:    CHA2DS2-VASc Score = 3  This indicates a 3.2% annual risk of stroke. The patient's score is based upon: CHF History: 0 HTN History: 0 Diabetes History: 0 Stroke History: 0 Vascular Disease History: 1 Age Score: 2 Gender Score: 0           Physical Exam:   VS:  BP 92/64   Pulse 82   Ht 6' 5 (1.956 m)   Wt 231 lb 6.4 oz (105 kg)   SpO2 97%   BMI 27.44 kg/m    Wt Readings from Last 3 Encounters:  01/10/24 231 lb 6.4 oz (105  kg)  01/09/24 228 lb (103.4 kg)  12/07/23 228 lb 9.6 oz (103.7 kg)     GEN: Well nourished, well developed in no acute distress NECK: No JVD CARDIAC: Normal rate, irregular rhythm RESPIRATORY:  Clear to auscultation without rales, wheezing or rhonchi  ABDOMEN: Soft, non-distended EXTREMITIES:  No edema; No deformity   ASSESSMENT AND PLAN:   Assessment and Plan Assessment & Plan #. Paroxysmal atrial fibrillation and flutter: Status post prior ablation.  He has no symptoms and has baseline dementia.  For this reason, I do not think a rhythm control strategy would be necessary.  He is rate controlled without medications.  He has borderline low blood pressure so will not start any rate controlling medications.  His atrial fibrillation is paroxysmal; however, his dizziness is much more frequent so I do not feel like these are necessarily related. #.  Secondary hypercoagulable state due to atrial fibrillation: CHADSVASC score of at least 3.  -Continue Eliquis  5mg  BID.  - Order updated echocardiogram to assess heart function. - Avoid new medications for AFib/flutter unless symptoms worsen.  #. Dizziness #. Orthostatic hypotension: - Encourage adequate hydration with water and water-based drinks. - Liberalize salt intake to maintain blood pressure. - Recommend wearing compression socks. - Patient has established with a primary care physician.  Recommend close follow-up and further evaluation for dizziness, including vertigo.   Follow up with EP APP in 6 months.   Signed, Fonda Kitty, MD  "

## 2024-01-10 NOTE — Patient Instructions (Signed)
 Medication Instructions:  Your physician recommends that you continue on your current medications as directed. Please refer to the Current Medication list given to you today.  *If you need a refill on your cardiac medications before your next appointment, please call your pharmacy*  Testing/Procedures: Echocardiogram Your physician has requested that you have an echocardiogram. Echocardiography is a painless test that uses sound waves to create images of your heart. It provides your doctor with information about the size and shape of your heart and how well your heart's chambers and valves are working. This procedure takes approximately one hour. There are no restrictions for this procedure. Please do NOT wear cologne, perfume, aftershave, or lotions (deodorant is allowed). Please arrive 15 minutes prior to your appointment time.  Please note: We ask at that you not bring children with you during ultrasound (echo/ vascular) testing. Due to room size and safety concerns, children are not allowed in the ultrasound rooms during exams. Our front office staff cannot provide observation of children in our lobby area while testing is being conducted. An adult accompanying a patient to their appointment will only be allowed in the ultrasound room at the discretion of the ultrasound technician under special circumstances. We apologize for any inconvenience.  Follow-Up: At [facility], you and your health needs are our priority.  As part of our continuing mission to provide you with exceptional heart care, we have created designated Provider Care Teams.  These Care Teams include your primary Cardiologist (physician) and Advanced Practice Providers (APPs -  Physician Assistants and Nurse Practitioners) who all work together to provide you with the care you need, when you need it.   Your next appointment:   6 months with EP APP

## 2024-01-11 ENCOUNTER — Other Ambulatory Visit: Payer: Self-pay | Admitting: Urology

## 2024-01-11 DIAGNOSIS — N138 Other obstructive and reflux uropathy: Secondary | ICD-10-CM

## 2024-01-11 MED ORDER — ALFUZOSIN HCL ER 10 MG PO TB24: 10.0000 mg | ORAL_TABLET | Freq: Every day | ORAL | 3 refills | Status: DC

## 2024-01-28 ENCOUNTER — Telehealth: Payer: Self-pay

## 2024-01-28 DIAGNOSIS — R0902 Hypoxemia: Secondary | ICD-10-CM | POA: Insufficient documentation

## 2024-01-28 NOTE — Transitions of Care (Post Inpatient/ED Visit) (Signed)
 01/28/2024  Name: Dennis Hill MRN: [id number] DOB: [birth year]  Today's TOC FU Call Status: Patient's daughter, Dennis Hill states she is a Engineer, civil (consulting) and understands medications, etc. TOC RN assisted via Care Guide in scheduling PCP hospital follow up appointment - Daughter did not feel a need to enroll in [facility] program but has [facility] RN phone number and understands she can call if questions/concerns arise.  Today's TOC FU Call Status:: Successful TOC FU Call Completed TOC FU Call Complete Date: 01/28/24 Patient's Name and Date of Birth confirmed.  Transition Care Management Follow-up Telephone Call Date of Discharge: 01/26/24 Discharge Facility: Other [facility]) Name of Other (Non-[facility]) Discharge Facility: [facility] Type of Discharge: Inpatient Admission Primary Inpatient Discharge Diagnosis:: Hypoxia How have you been since you were released from the hospital?: Better Any questions or concerns?: No (spoke with daughter, Dennis Hill who works [facility] [employer] system)  Items Reviewed: Did you receive and understand the discharge instructions provided?: Yes Medications obtained,verified, and reconciled?: Yes (Medications Reviewed) Any new allergies since your discharge?: No Dietary orders reviewed?: No Do you have support at home?: Yes People in Home [RPT]: child(ren), dependent Name of Support/Comfort Primary Source: Patient currently living with daugther, [name] and this daughter, Dennis Hill who states she has POA  Medications Reviewed Today: Medications Reviewed Today     Reviewed by Sharmaine Dearth, RN (Registered Nurse) on 01/28/24 at 1354  Med List Status: <None>   Medication Order Taking? Sig Documenting Provider Last Dose Status Informant  acetaminophen  (TYLENOL ) 325 MG tablet [id number] Yes Take 650 mg by mouth every 6 (six) hours as needed for moderate pain (pain score 4-6). [provider] Taking Active   alfuzosin  (UROXATRAL ) 10 MG 24 hr tablet [id number] No Take  1 tablet (10 mg total) by mouth daily with breakfast.  Patient not taking: Reported on 01/28/2024   Trent Frizzle, MD Not Taking Active   amoxicillin (AMOXIL) 500 MG capsule [id number] Yes Take 500 mg by mouth 3 (three) times daily. [provider] Taking Active   apixaban  (ELIQUIS ) 5 MG TABS tablet [id number] Yes Take 5 mg by mouth 2 (two) times daily. [provider] Taking Active   b complex vitamins capsule [id number] Yes Take 1 capsule by mouth daily. [provider] Taking Active   cetirizine  (ZYRTEC ) 10 MG tablet [id number] Yes Take 10 mg by mouth daily. [provider] Taking Active   co-enzyme Q-10 30 MG capsule [id number] Yes Take 50 mg by mouth daily. [provider] Taking Active   diclofenac  sodium (VOLTAREN ) 1 % GEL [id number] No Apply 4 g topically 4 (four) times daily as needed.  Patient not taking: Reported on 01/28/2024   Roslyn Coombe, MD Not Taking Active   donepezil (ARICEPT) 5 MG tablet [id number] Yes Take 5 mg by mouth at bedtime. [provider] Taking Active   ibuprofen (ADVIL,MOTRIN) 200 MG tablet [id number] No Take 600 mg by mouth every 6 (six) hours as needed for moderate pain.  Patient not taking: Reported on 01/28/2024   [provider] Not Taking Active Self            Home Care and Equipment/Supplies: Were Home Health Services Ordered?: No Any new equipment or medical supplies ordered?: No  Functional Questionnaire: Do you need assistance with bathing/showering or dressing?: No (daughter states patient does ADLs on his own but there is always someone with him) Do you need assistance with meal preparation?: Yes Do you need assistance with eating?: No Do you  have difficulty maintaining continence: Yes (daughter reports bladder incontinence with no skin breakdown) Do you need assistance with getting out of bed/getting out of a chair/moving?: No Do you have difficulty managing or taking your  medications?: Yes (daughters manage medications)  Follow up appointments reviewed: PCP Follow-up appointment confirmed?: Yes Date of PCP follow-up appointment?: 02/01/24 Follow-up Provider: Estil Heman, NP Specialist Hospital Follow-up appointment confirmed?: Yes Date of Specialist follow-up appointment?: 02/05/24 Follow-Up Specialty Provider:: Neuro Do you need transportation to your follow-up appointment?: No Do you understand care options if your condition(s) worsen?: Yes-patient verbalized understanding  SDOH Interventions Today    Flowsheet Row Most Recent Value  SDOH Interventions   Food Insecurity Interventions Intervention Not Indicated  Housing Interventions Intervention Not Indicated  Transportation Interventions Intervention Not Indicated  Utilities Interventions Intervention Not Indicated       Tonia Frankel RN, CCM [facility]  VBCI-Population Health RN Care Manager [phone]

## 2024-01-30 ENCOUNTER — Encounter: Payer: Self-pay | Admitting: Cardiology

## 2024-01-30 ENCOUNTER — Encounter: Payer: Self-pay | Admitting: Urology

## 2024-01-30 MED ORDER — APIXABAN 5 MG PO TABS
5.0000 mg | ORAL_TABLET | Freq: Two times a day (BID) | ORAL | 5 refills | Status: DC
Start: 2024-01-30 — End: 2024-07-29

## 2024-02-01 ENCOUNTER — Inpatient Hospital Stay: Admitting: Family

## 2024-02-02 NOTE — Progress Notes (Addendum)
 Assessment/Plan:     Dennis Hill is a very pleasant 78 y.o. 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 MoCA today unable to be done. MMSE is 21/30. Mood is good. Patient is able to participate on his ADLs, no longer drives.      Dementia likely due to Alzheimer's disease,  MRI brain with and without contrast to assess for underlying structural abnormality and assess vascular load  Increase donepezil to 10 mg daily, side effects discussed. Recommend good control of cardiovascular risk factors.   Continue to control mood as per PCP [place] up in 4 months   Subjective:    The patient is accompanied by his daughter who supplement  the history.    How long did patient have memory difficulties?  For about 2.5 years. "We waited till the following year to be seen at  [facility] ([facility]). Neuropsych evaluation was concerning for Alzheimer's disease with vascular component as well. Patient reports  difficulty remembering new information, recent conversations, names which it becomes frustrating at times. LTM is better than STM. Moved from [place] to [place] in March 2025.Daughter works for [employer] therefore they wanted to be seen within our system.  repeats oneself?  Endorsed, more frequently than before, especially over the last 1.5 month.  Disoriented when walking into a room?  He reports that he lives in [place] or in [place], or in [place]  (where he was born and grew up). "He never says [place]"-daughter says.    Leaving objects in unusual places? HE may misplaced stuff.   Wandering behavior? "He may walk around but has not really gotten lost. Doors are locked" Any personality changes, or depression,  anxiety? A long time ago there was some depression, but there is frustration, anxiety, anger related to the memory difficulties per daughter's report..  Hallucinations or paranoia? Before he moved, he thought that his wife was not his real wife, "a different Lenon Radar". Seizures? Denies.    Any sleep changes?  Sleeps well . Denies frequent nightmares or dream reenactment, other REM behavior or sleepwalking   Sleep apnea? Denies.   Any hygiene concerns?  He needs reminder, he does it 2 times a week and he used to take it 2 times daily.   Independent of bathing and dressing? Endorsed  Does the patient need help with medications? Daughter is in charge   Who is in charge of the finances? The other daughter is in charge     Any changes in appetite?  Sometimes he may forget to eat.      Patient have trouble swallowing?  Denies.   Does the patient cook? No  Any headaches?  Denies.   Chronic pain? Arthritis in  both knees   Ambulates with difficulty?  He walks about 1 mile a day. Recent falls or head injuries? A recent fall, unwitnessed, 3-4 weeks ago, fell on his knees, he could not remember whom to call to get up so he called his wife who is still in [place] who called his daughter in [place].   Vision changes?  Denies any new issues.  Has a history of cataracts Any strokelike symptoms? Denies.   Any tremors? Denies.   Any anosmia? Denies.   Any incontinence of urine?  Has urinary  frequency , urge incontinence in the setting of BPH. Any bowel dysfunction?  Denies       Patient lives with his daughter in [place]. They are trying to keep him with them as much as they can.  History of heavy alcohol intake? Denies.   History of heavy tobacco use? Denies.   Family history of dementia? Denies  Does patient drive? No longer drives   Recent labs March 2025, TSH 2.46, A1c 6.4  Allergies  Allergen Reactions   Lisinopril  Swelling    Tongue swelling   Xarelto  [Rivaroxaban ] Swelling    Tongue swelling   Lipitor  [Atorvastatin ] Other (See Comments)    Mental foggy    Current Outpatient Medications  Medication Instructions   acetaminophen  (TYLENOL ) 650 mg, Every 6 hours PRN   alfuzosin  (UROXATRAL ) 10 mg, Oral, Daily with breakfast   apixaban  (ELIQUIS ) 5 mg, Oral, 2 times daily   b complex vitamins capsule 1 capsule, Daily   cetirizine  (ZYRTEC ) 10 mg, Daily   co-enzyme Q-10 50 mg, Daily   diclofenac  sodium (VOLTAREN ) 4 g, Topical, 4 times daily PRN   donepezil (ARICEPT) 10 MG tablet Take one tab at bedtime   ibuprofen (ADVIL) 600 mg, Every 6 hours PRN     VITALS:   Vitals:   02/05/24 1255  BP: 116/72  Pulse: 74  Resp: 18  SpO2: 96%  Weight: 232 lb (105.2 kg)  Height: 6\' 5"  (1.956 m)      PHYSICAL EXAM   HEENT:  Normocephalic, atraumatic.  The superficial temporal arteries are without ropiness or tenderness. Cardiovascular: Regular rate and rhythm. Lungs: Clear to auscultation bilaterally. Neck: There are no carotid bruits noted bilaterally.  NEUROLOGICAL:     No data to display             02/05/2024    2:00 PM  MMSE - Mini Mental State Exam  Orientation to time 2  Orientation to Place 0  Registration 3  Attention/ Calculation 5  Recall 2  Language- name 2 objects 2  Language- repeat 1  Language- follow 3 step command 3  Language- read & follow direction 1  Write a sentence 1  Copy design 1  Total score 21     Orientation:  Alert and oriented to person, not to place and not to time. No aphasia or dysarthria. Fund of knowledge is reduced. Recent and remote memory impaired.  Attention and concentration are reduced .  Able to name objects and repeat phrases.   Delayed recall 2/3 Cranial nerves: There is good facial symmetry. Extraocular muscles are intact and visual fields are full to confrontational testing. Speech is fluent and clear. No tongue deviation. Hearing is intact to conversational tone.  Tone: Tone is good throughout. Sensation: Sensation is intact to  light touch.  Vibration is intact at the bilateral big toe.  Coordination: The patient has no difficulty with RAM's or FNF bilaterally. Normal finger to nose  Motor: Strength is 5/5 in the bilateral upper and lower extremities. There is no pronator drift. There are no fasciculations noted. DTR's: Deep tendon reflexes are 2/4 bilaterally. Gait and Station: The patient is able to ambulate without difficulty. Gait is cautious and narrow. Stride length is normal.        Thank you for allowing us  the opportunity to participate in the care of this nice patient. Please do not hesitate to contact us  for any questions or concerns.   Total time spent on today's visit was 61 minutes  dedicated to this patient today, preparing to see patient, examining the patient, ordering tests and/or medications and counseling the patient, documenting clinical information in the EHR or other health record, independently interpreting results and communicating results to the patient/family, discussing treatment and goals, answering patient's questions and coordinating care.  Cc:  [name], [name], NP  Tex Filbert 02/05/2024 2:40 PM

## 2024-02-05 ENCOUNTER — Ambulatory Visit: Payer: Self-pay | Admitting: Physician Assistant

## 2024-02-05 ENCOUNTER — Encounter: Payer: Self-pay | Admitting: Physician Assistant

## 2024-02-05 ENCOUNTER — Encounter

## 2024-02-05 VITALS — BP 116/72 | HR 74 | Resp 18 | Ht 77.0 in | Wt 232.0 lb

## 2024-02-05 DIAGNOSIS — R413 Other amnesia: Secondary | ICD-10-CM | POA: Diagnosis not present

## 2024-02-05 MED ORDER — DONEPEZIL HCL 10 MG PO TABS: ORAL_TABLET | ORAL | 3 refills | Status: DC

## 2024-02-05 NOTE — Patient Instructions (Addendum)
 It was a pleasure to see you today at our office.   Recommendations:     MRI of the brain, the radiology office will call you to arrange you appointment  [phone] Follow up in 4 months Continue donepezil, consider increasing to 10 mg daily.   Recommend visiting the website : " [facility]ementia [facility]" to better understand some behaviors related to memory loss.  For psychiatric meds, mood meds: Please have your primary care physician manage these medications.  If you have any severe symptoms of a stroke, or other severe issues such as confusion,severe chills or fever, etc call 911 or go to the ER as you may need to be evaluated further For guidance regarding [facility] and if placement were needed at the facility, contact Social Worker tel: [phone]  For assessment of decision of mental capacity and competency:  Call Dr. Laverne Potter, geriatric psychiatrist at [phone] Counseling regarding caregiver distress, including caregiver depression, anxiety and issues regarding community resources, adult day care programs, adult living facilities, or memory care questions:  please contact your  Primary Doctor's Social Worker   FOR Memory  decline, memory medications: Call our office [phone]    [email or web address]   RECOMMENDATIONS FOR ALL PATIENTS WITH MEMORY PROBLEMS: 1. Continue to exercise (Recommend 30 minutes of walking everyday, or 3 hours every week) 2. Increase social interactions - continue going to [place] and enjoy social gatherings with friends and family 3. Eat healthy, avoid fried foods and eat more fruits and vegetables 4. Maintain adequate blood pressure, blood sugar, and blood cholesterol level. Reducing the risk of stroke and cardiovascular disease also helps promoting better memory. 5. Avoid stressful situations. Live a simple life and avoid aggravations. Organize your time and prepare for the  next day in anticipation. 6. Sleep well, avoid any interruptions of sleep and avoid any distractions in the bedroom that may interfere with adequate sleep quality 7. Avoid sugar, avoid sweets as there is a strong link between excessive sugar intake, diabetes, and cognitive impairment We discussed the Mediterranean diet, which has been shown to help patients reduce the risk of progressive memory disorders and reduces cardiovascular risk. This includes eating fish, eat fruits and green leafy vegetables, nuts like almonds and hazelnuts, walnuts, and also use olive oil. Avoid fast foods and fried foods as much as possible. Avoid sweets and sugar as sugar use has been linked to worsening of memory function.  There is always a concern of gradual progression of memory problems. If this is the case, then we may need to adjust level of care according to patient needs. Support, both to the patient and caregiver, should then be put into place.      You have been referred for a neuropsychological evaluation (i.e., evaluation of memory and thinking abilities). Please bring someone with you to this appointment if possible, as it is helpful for the doctor to hear from both you and another adult who knows you well. Please bring eyeglasses and hearing aids if you wear them.    The evaluation will take approximately 3 hours and has two parts: -  The first part is a clinical interview with the neuropsychologist (Dr. Kitty Perkins or Dr. Donavon Fudge). During the interview, the neuropsychologist will speak with you and the individual you brought to the appointment.    The second part of the evaluation is testing with the doctor's technician Bernabe Brew or Burdette Carolin). During the testing, the technician will ask you to remember different types of material, solve  problems, and answer some questionnaires. Your family member will not be present for this portion of the evaluation.   Please note: We must reserve several hours of the  neuropsychologist's time and the psychometrician's time for your evaluation appointment. As such, there is a No-Show fee of $100. If you are unable to attend any of your appointments, please contact our office as soon as possible to reschedule.      DRIVING: Regarding driving, in patients with progressive memory problems, driving will be impaired. We advise to have someone else do the driving if trouble finding directions or if minor accidents are reported. Independent driving assessment is available to determine safety of driving.   If you are interested in the driving assessment, you can contact the following:  [facility] [employer] [facility] in [place] [phone]  [facility] [phone]  [facility] [phone]  [facility] [phone] or [phone]   FALL PRECAUTIONS: Be cautious when walking. Scan the area for obstacles that may increase the risk of trips and falls. When getting up in the mornings, sit up at the edge of the bed for a few minutes before getting out of bed. Consider elevating the bed at the head end to avoid drop of blood pressure when getting up. Walk always in a well-lit room (use night lights in the walls). Avoid area rugs or power cords from appliances in the middle of the walkways. Use a walker or a cane if necessary and consider physical therapy for balance exercise. Get your eyesight checked regularly.  FINANCIAL OVERSIGHT: Supervision, especially oversight when making financial decisions or transactions is also recommended.  HOME SAFETY: Consider the safety of the kitchen when operating appliances like stoves, microwave oven, and blender. Consider having supervision and share cooking responsibilities until no longer able to participate in those. Accidents with firearms and other hazards in the house should be identified and addressed as well.   ABILITY TO BE LEFT ALONE: If patient is unable to contact 911 operator, consider  using [employer], or when the need is there, arrange for someone to stay with patients. Smoking is a fire hazard, consider supervision or cessation. Risk of wandering should be assessed by caregiver and if detected at any point, supervision and safe proof recommendations should be instituted.  MEDICATION SUPERVISION: Inability to self-administer medication needs to be constantly addressed. Implement a mechanism to ensure safe administration of the medications.      Mediterranean Diet A Mediterranean diet refers to food and lifestyle choices that are based on the traditions of countries located on the [employer] [place]. This way of eating has been shown to help prevent certain conditions and improve outcomes for people who have chronic diseases, like kidney disease and heart disease. What are tips for following this plan? Lifestyle  Cook and eat meals together with your family, when possible. Drink enough fluid to keep your urine clear or pale yellow. Be physically active every day. This includes: Aerobic exercise like running or swimming. Leisure activities like gardening, walking, or housework. Get 7-8 hours of sleep each night. If recommended by your health care provider, drink red wine in moderation. This means 1 glass a day for nonpregnant women and 2 glasses a day for men. A glass of wine equals 5 oz (150 mL). Reading food labels  Check the serving size of packaged foods. For foods such as rice and pasta, the serving size refers to the amount of cooked product, not dry. Check the total fat in packaged foods. Avoid  foods that have saturated fat or trans fats. Check the ingredients list for added sugars, such as corn syrup. Shopping  At the grocery store, buy most of your food from the areas near the walls of the store. This includes: Fresh fruits and vegetables (produce). Grains, beans, nuts, and seeds. Some of these may be available in unpackaged forms or large amounts (in bulk). Fresh  seafood. Poultry and eggs. Low-fat dairy products. Buy whole ingredients instead of prepackaged foods. Buy fresh fruits and vegetables in-season from local farmers markets. Buy frozen fruits and vegetables in resealable bags. If you do not have access to quality fresh seafood, buy precooked frozen shrimp or canned fish, such as tuna, salmon, or sardines. Buy small amounts of raw or cooked vegetables, salads, or olives from the deli or salad bar at your store. Stock your pantry so you always have certain foods on hand, such as olive oil, canned tuna, canned tomatoes, rice, pasta, and beans. Cooking  Cook foods with extra-virgin olive oil instead of using butter or other vegetable oils. Have meat as a side dish, and have vegetables or grains as your main dish. This means having meat in small portions or adding small amounts of meat to foods like pasta or stew. Use beans or vegetables instead of meat in common dishes like chili or lasagna. Experiment with different cooking methods. Try roasting or broiling vegetables instead of steaming or sauteing them. Add frozen vegetables to soups, stews, pasta, or rice. Add nuts or seeds for added healthy fat at each meal. You can add these to yogurt, salads, or vegetable dishes. Marinate fish or vegetables using olive oil, lemon juice, garlic, and fresh herbs. Meal planning  Plan to eat 1 vegetarian meal one day each week. Try to work up to 2 vegetarian meals, if possible. Eat seafood 2 or more times a week. Have healthy snacks readily available, such as: Vegetable sticks with hummus. Greek yogurt. Fruit and nut trail mix. Eat balanced meals throughout the week. This includes: Fruit: 2-3 servings a day Vegetables: 4-5 servings a day Low-fat dairy: 2 servings a day Fish, poultry, or lean meat: 1 serving a day Beans and legumes: 2 or more servings a week Nuts and seeds: 1-2 servings a day Whole grains: 6-8 servings a day Extra-virgin olive oil: 3-4  servings a day Limit red meat and sweets to only a few servings a month What are my food choices? Mediterranean diet Recommended Grains: Whole-grain pasta. Brown rice. Bulgar wheat. Polenta. Couscous. Whole-wheat bread. Dwyane Glad. Vegetables: Artichokes. Beets. Broccoli. Cabbage. Carrots. Eggplant. Green beans. Chard. Kale. Spinach. Onions. Leeks. Peas. Squash. Tomatoes. Peppers. Radishes. Fruits: Apples. Apricots. Avocado. Berries. Bananas. Cherries. Dates. Figs. Grapes. Lemons. Melon. Oranges. Peaches. Plums. Pomegranate. Meats and other protein foods: Beans. Almonds. Sunflower seeds. Pine nuts. Peanuts. Cod. Salmon. Scallops. Shrimp. Tuna. Tilapia. Clams. Oysters. Eggs. Dairy: Low-fat milk. Cheese. Greek yogurt. Beverages: Water. Red wine. Herbal tea. Fats and oils: Extra virgin olive oil. Avocado oil. Grape seed oil. Sweets and desserts: [place] yogurt with honey. Baked apples. Poached pears. Trail mix. Seasoning and other foods: Basil. Cilantro. Coriander. Cumin. Mint. Parsley. Sage. Rosemary. Tarragon. Garlic. Oregano. Thyme. Pepper. Balsalmic vinegar. Tahini. Hummus. Tomato sauce. Olives. Mushrooms. Limit these Grains: Prepackaged pasta or rice dishes. Prepackaged cereal with added sugar. Vegetables: Deep fried potatoes (french fries). Fruits: Fruit canned in syrup. Meats and other protein foods: Beef. Pork. Lamb. Poultry with skin. Hot dogs. Helene Loader. Dairy: Ice cream. Sour cream. Whole milk. Beverages: Juice. Sugar-sweetened soft  drinks. Beer. Liquor and spirits. Fats and oils: Butter. Canola oil. Vegetable oil. Beef fat (tallow). Lard. Sweets and desserts: Cookies. Cakes. Pies. Candy. Seasoning and other foods: Mayonnaise. Premade sauces and marinades. The items listed may not be a complete list. Talk with your dietitian about what dietary choices are right for you. Summary The Mediterranean diet includes both food and lifestyle choices. Eat a variety of fresh fruits and  vegetables, beans, nuts, seeds, and whole grains. Limit the amount of red meat and sweets that you eat. Talk with your health care provider about whether it is safe for you to drink red wine in moderation. This means 1 glass a day for nonpregnant women and 2 glasses a day for men. A glass of wine equals 5 oz (150 mL). This information is not intended to replace advice given to you by your health care provider. Make sure you discuss any questions you have with your health care provider. Document Released: 04/27/2016 Document Revised: 05/30/2016 Document Reviewed: 04/27/2016 Elsevier Interactive Patient Education  2017 [employer].

## 2024-02-06 ENCOUNTER — Ambulatory Visit (INDEPENDENT_AMBULATORY_CARE_PROVIDER_SITE_OTHER): Admitting: Family

## 2024-02-06 ENCOUNTER — Encounter: Payer: Self-pay | Admitting: Family

## 2024-02-06 ENCOUNTER — Telehealth: Payer: Self-pay | Admitting: Family

## 2024-02-06 VITALS — BP 104/60 | HR 77 | Temp 96.9°F | Ht 77.0 in | Wt 231.6 lb

## 2024-02-06 DIAGNOSIS — I482 Chronic atrial fibrillation, unspecified: Secondary | ICD-10-CM | POA: Insufficient documentation

## 2024-02-06 DIAGNOSIS — N400 Enlarged prostate without lower urinary tract symptoms: Secondary | ICD-10-CM

## 2024-02-06 DIAGNOSIS — I5022 Chronic systolic (congestive) heart failure: Secondary | ICD-10-CM | POA: Insufficient documentation

## 2024-02-06 DIAGNOSIS — R911 Solitary pulmonary nodule: Secondary | ICD-10-CM

## 2024-02-06 DIAGNOSIS — E1165 Type 2 diabetes mellitus with hyperglycemia: Secondary | ICD-10-CM

## 2024-02-06 LAB — COMPLETE METABOLIC PANEL WITHOUT GFR
AG Ratio: 1.7 (calc) (ref 1.0–2.5)
ALT: 26 U/L (ref 9–46)
AST: 19 U/L (ref 10–35)
Albumin: 4 g/dL (ref 3.6–5.1)
Alkaline phosphatase (APISO): 79 U/L (ref 35–144)
BUN: 20 mg/dL (ref 7–25)
CO2: 30 mmol/L (ref 20–32)
Calcium: 9.9 mg/dL (ref 8.6–10.3)
Chloride: 106 mmol/L (ref 98–110)
Creat: 1.13 mg/dL (ref 0.70–1.28)
Globulin: 2.4 g/dL (ref 1.9–3.7)
Glucose, Bld: 106 mg/dL (ref 65–139)
Potassium: 5.1 mmol/L (ref 3.5–5.3)
Sodium: 141 mmol/L (ref 135–146)
Total Bilirubin: 0.6 mg/dL (ref 0.2–1.2)
Total Protein: 6.4 g/dL (ref 6.1–8.1)

## 2024-02-06 LAB — CBC WITH DIFFERENTIAL/PLATELET
Absolute Lymphocytes: 2025 {cells}/uL (ref 850–3900)
Absolute Monocytes: 739 {cells}/uL (ref 200–950)
Basophils Absolute: 133 {cells}/uL (ref 0–200)
Basophils Relative: 1.6 %
Eosinophils Absolute: 191 {cells}/uL (ref 15–500)
Eosinophils Relative: 2.3 %
HCT: 42 % (ref 38.5–50.0)
Hemoglobin: 13.6 g/dL (ref 13.2–17.1)
MCH: 28.7 pg (ref 27.0–33.0)
MCHC: 32.4 g/dL (ref 32.0–36.0)
MCV: 88.6 fL (ref 80.0–100.0)
MPV: 9.4 fL (ref 7.5–12.5)
Monocytes Relative: 8.9 %
Neutro Abs: 5212 {cells}/uL (ref 1500–7800)
Neutrophils Relative %: 62.8 %
Platelets: 333 10*3/uL (ref 140–400)
RBC: 4.74 10*6/uL (ref 4.20–5.80)
RDW: 13 % (ref 11.0–15.0)
Total Lymphocyte: 24.4 %
WBC: 8.3 10*3/uL (ref 3.8–10.8)

## 2024-02-06 NOTE — Progress Notes (Signed)
 Provider: Christean Courts FNP-C  Libra Gatz, [name], NP  Patient Care Team: Najat Olazabal, [name], NP as PCP - General (Family Medicine) Ardeen Kohler, MD as PCP - Electrophysiology (Cardiology)  Extended Emergency Contact Information Primary Emergency Contact: Maudie Sorrow Address: [address]          [place], [place] [address] [place] Mobile Phone: [phone] Relation: Daughter Secondary Emergency Contact: Willey Harrier, [place] [address] [place] Phone: [phone] Relation: Other  Code Status:  Full Code  Goals of care: Advanced Directive information    02/05/2024    1:38 PM  Advanced Directives  Does Patient Have a Medical Advance Directive? Yes  Type of Advance Directive Healthcare Power of Attorney  Does patient want to make changes to medical advance directive? No - Patient declined  Copy of Healthcare Power of Attorney in Chart? No - copy requested     Chief Complaint  Patient presents with   Hospitalization Follow-up    Hospital follow up 01/24/2024 Hypoxia, pt stated that he is feeling okay  , he has no new concerns     Discussed the use of AI scribe software for clinical note transcription with the patient, who gave verbal consent to proceed.  History of Present Illness   Dennis Hill is a 78 year old male who presents for follow-up after a recent hospital visit for disorientation and infection.  He was hospitalized at [facility] on May 8th to may 10 ,2025 due to disorientation and fever, with a temperature of 101.42F. An infected tooth was identified and treated by extraction. During the hospital stay, he experienced low oxygen levels, confusion, and weakness. A CT scan was performed, and since discharge, there have been no further episodes of fever or disorientation.  He has a history of irregular heartbeat but has not experienced recent palpitations. His blood pressure tends to  be low, and a medication prescribed by a urologist was discontinued in the hospital as it was thought to contribute to hypotension without alleviating urinary symptoms.  His current medications include ibuprofen and Voltaren  gel as needed, B complex, CoQ10, Tylenol  as needed, Citrulline 10 mg, Eliquis  5 mg twice daily, and donepezil 10 mg daily, which was recently increased. He rarely uses ibuprofen and Voltaren  gel.  He has a history of congestive heart failure but denies recent leg swelling. He does not monitor his blood pressure at home.  No changes in appetite, and he feels hungry. He sleeps well at night and has not had issues with blood sugar levels, which are monitored by his doctors.  He has an upcoming MRI of the brain scheduled for June 4th and a follow-up with the neurologist in September. He also has appointments for an echocardiogram and with a dermatologist in June.  No shortness of breath, fever, palpitations, or leg swelling. Good sleep and no changes in hearing.    Past Medical History:  Diagnosis Date   Anxiety    Arthritis    Atrial bigeminy 09/18/2006   Dr. 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  Fatty liver    GERD 03/15/2009   no medications now   Headache(784.0)    "monthly" (03/06/2013)   Hx of adenomatous colonic polyps 03/15/2009   Hx of cardiovascular stress test    ETT-Myoview (9/15):  Inferior infarct, no ischemia, EF 41% - Intermediate Risk   Hyperlipidemia 02/10/2011   NEPHROLITHIASIS, HX OF 01/28/2010   NICM (nonischemic cardiomyopathy) (HCC)    Echo (10/15):  Mild LVH, EF 35-40%, diff HK   Pneumonia ~ 1950   Sleep apnea    Past Surgical  History:  Procedure Laterality Date   APPENDECTOMY     ATRIAL FLUTTER ABLATION  03/06/2013   ATRIAL FLUTTER ABLATION N/A 03/06/2013   Procedure: ATRIAL FLUTTER ABLATION;  Surgeon: [name], MD;  Location: [facility] CATH LAB;  Service: Cardiovascular;  Laterality: N/A;   CARDIOVERSION N/A 02/06/2013   Procedure: CARDIOVERSION;  Surgeon: [name], MD;  Location: [facility] ENDOSCOPY;  Service: Cardiovascular;  Laterality: N/A;   CHOLECYSTECTOMY  09/18/2004   INGUINAL HERNIA REPAIR Right 09/18/1984   JOINT REPLACEMENT     LEFT HEART CATHETERIZATION WITH CORONARY ANGIOGRAM N/A 06/19/2014   Procedure: LEFT HEART CATHETERIZATION WITH CORONARY ANGIOGRAM;  Surgeon: [name], MD;  Location: [facility] CATH LAB;  Service: Cardiovascular;  Laterality: N/A;   TEE WITHOUT CARDIOVERSION N/A 03/05/2013   Procedure: TRANSESOPHAGEAL ECHOCARDIOGRAM (TEE);  Surgeon: [name], MD;  Location: [facility] ENDOSCOPY;  Service: Cardiovascular;  Laterality: N/A;  [name]   TONSILLECTOMY AND ADENOIDECTOMY  ~ 1952   TOTAL KNEE ARTHROPLASTY Left 02/22/2016   Procedure: TOTAL KNEE ARTHROPLASTY;  Surgeon: [name], MD;  Location: [facility] OR;  Service: Orthopedics;  Laterality: Left;    Allergies  Allergen Reactions   Lisinopril  Swelling    Tongue swelling   Xarelto  [Rivaroxaban ] Swelling    Tongue swelling   Lipitor [Atorvastatin ] Other (See Comments)    Mental foggy    Outpatient Encounter Medications as of 02/06/2024  Medication Sig   acetaminophen  (TYLENOL ) 325 MG tablet Take 650 mg by mouth every 6 (six) hours as needed for moderate pain (pain score 4-6).   apixaban  (ELIQUIS ) 5 MG TABS tablet Take 1 tablet (5 mg total) by mouth 2 (two) times daily.   b complex vitamins capsule Take 1 capsule by mouth daily.   cetirizine  (ZYRTEC ) 10 MG tablet Take 10 mg by mouth daily.   co-enzyme Q-10 30 MG capsule Take 50 mg by mouth daily.   diclofenac  sodium (VOLTAREN ) 1 % GEL Apply 4 g topically 4 (four) times daily  as needed.   donepezil (ARICEPT) 10 MG tablet Take one tab at bedtime   ibuprofen (ADVIL,MOTRIN) 200 MG tablet Take 600 mg by mouth every 6 (six) hours as needed for moderate pain (pain score 4-6).   [DISCONTINUED] alfuzosin  (UROXATRAL ) 10 MG 24 hr tablet Take 1 tablet (10 mg total) by mouth daily with breakfast. (Patient not taking: Reported on 01/28/2024)   No facility-administered encounter medications on file as of 02/06/2024.    Review of Systems  Constitutional:  Negative for appetite change, chills, fatigue, fever and unexpected weight change.  HENT:  Negative for congestion, dental problem, ear discharge, ear pain, facial swelling, hearing loss, nosebleeds, postnasal drip, rhinorrhea, sinus pressure, sinus pain, sneezing, sore throat, tinnitus and trouble swallowing.   Eyes:  Negative for pain, discharge, redness, itching and visual disturbance.  Respiratory:  Negative for cough, chest tightness, shortness of breath and wheezing.   Cardiovascular:  Negative for chest pain, palpitations and leg  swelling.  Gastrointestinal:  Negative for abdominal distention, abdominal pain, blood in stool, constipation, diarrhea, nausea and vomiting.  Endocrine: Negative for cold intolerance, heat intolerance, polydipsia, polyphagia and polyuria.  Genitourinary:  Positive for frequency and urgency. Negative for difficulty urinating, dysuria, flank pain and hematuria.       BPH   Musculoskeletal:  Positive for arthralgias. Negative for back pain, gait problem, joint swelling, myalgias, neck pain and neck stiffness.  Skin:  Negative for color change, pallor, rash and wound.  Neurological:  Negative for dizziness, syncope, speech difficulty, weakness, light-headedness, numbness and headaches.  Hematological:  Does not bruise/bleed easily.  Psychiatric/Behavioral:  Negative for agitation, behavioral problems, confusion, hallucinations, self-injury, sleep disturbance and suicidal ideas. The patient is not  nervous/anxious.     Immunization History  Administered Date(s) Administered   Influenza, High Dose Seasonal PF 07/27/2017   Moderna Sars-Covid-2 Vaccination 11/10/2019, 12/08/2019   PPD Test 02/24/2016   Pneumococcal Conjugate-13 01/09/2014   Pneumococcal Polysaccharide-23 03/15/2009   Td 03/15/2009   Pertinent  Health Maintenance Due  Topic Date Due   FOOT EXAM  01/10/2015   OPHTHALMOLOGY EXAM  04/16/2015   INFLUENZA VACCINE  04/18/2024   HEMOGLOBIN A1C  06/14/2024   Colonoscopy  Discontinued      01/09/2014    8:38 AM 01/15/2015    8:38 AM 03/07/2017    3:40 PM 12/07/2023    2:27 PM 02/05/2024    1:37 PM  Fall Risk  Falls in the past year? No No No 1 0  Was there an injury with Fall?    0 0  Fall Risk Category Calculator    1 0  Patient at Risk for Falls Due to    History of fall(s)   Fall risk Follow up    Falls evaluation completed Falls evaluation completed   Functional Status Survey:    Vitals:   02/05/24 1624  BP: 104/60  Pulse: 77  Temp: (!) 96.9 F (36.1 C)  TempSrc: Temporal  SpO2: 96%  Weight: 231 lb 9.6 oz (105.1 kg)  Height: 6\' 5"  (1.956 m)   Body mass index is 27.46 kg/m. Physical Exam VITALS: T- 96.9, P- 77, BP- 104/60, SaO2- 96% MEASUREMENTS: Weight- 231. GENERAL: Alert, cooperative, well developed, no acute distress HEENT: Normocephalic, normal oropharynx, moist mucous membranes, no sinus tenderness, ears and nose normal NECK: Supple, no nuchal rigidity CHEST: Clear to auscultation bilaterally, no wheezes, rhonchi, or crackles CARDIOVASCULAR: Normal heart rate and rhythm, S1 and S2 normal without murmurs ABDOMEN: Soft, non-tender, non-distended, without organomegaly, normal bowel sounds EXTREMITIES: No cyanosis or edema NEUROLOGICAL: Cranial nerves grossly intact, moves all extremities without gross motor or sensory deficit  SKIN: No rash,no lesion or erythema   PSYCHIATRY/BEHAVIORAL: Mood stable   Labs reviewed: Recent Labs     12/13/23 0824  NA 144  K 5.7*  CL 108  CO2 30  GLUCOSE 116*  BUN 24  CREATININE 1.23  CALCIUM  10.2   Recent Labs    12/13/23 0824  AST 14  ALT 14  BILITOT 0.5  PROT 6.5   Recent Labs    12/13/23 0824  WBC 7.4  NEUTROABS 4,329  HGB 15.4  HCT 46.1  MCV 87.6  PLT 232   Lab Results  Component Value Date   TSH 2.46 12/13/2023   Lab Results  Component Value Date   HGBA1C 6.4 (H) 12/13/2023   Lab Results  Component Value Date   CHOL 142 12/13/2023   HDL 39 (L) 12/13/2023  LDLCALC 84 12/13/2023   LDLDIRECT 87.2 09/05/2013   TRIG 94 12/13/2023   CHOLHDL 3.6 12/13/2023    Significant Diagnostic Results in last 30 days:  No results found.  Assessment/Plan    Dental caries with infection Recent dental infection post-procedure led to febrile episode and disorientation. Infection resolved after tooth extraction.  Congestive heart failure Asymptomatic for heart failure, advised to avoid dietary salt to manage condition. - Advise to avoid dietary salt.  PAF - HR controlled  - continue to follow up with cardiologist   RUL lung lesion  continue to monitor   BPH with LUTs - Uroxatral  discontinued during hospital visit due to hypotension  - continue to follow up with urologist   Low blood pressure Low blood pressure exacerbated by urologist-prescribed medication Uroxatral , normalized post-discontinuation.  Dementia without behavioral disturbance  Memory issues managed with donepezil, increased to 10 mg daily. Neurology follow-up scheduled, MRI planned for further evaluation. - Continue donepezil 10 mg daily. - Schedule MRI of the brain on June 4th. - Follow up with neurologist in September.  General Health Maintenance Routine health maintenance and preventive care measures discussed. - Recheck CBC with differential and CMP to monitor hemoglobin and white blood cell count. - Schedule Medicare wellness visit. - Consider Tdap vaccination at pharmacy. -  Follow up with podiatrist if not contacted in two weeks.  Family/ staff Communication: Reviewed plan of care with patient and daughter verbalized understanding   Labs/tests ordered:  - CBC with Differential/Platelet - CMP with eGFR([facility])  Next Appointment: Return if symptoms worsen or fail to improve.   Total time: 30 minutes. Greater than 50% of total time spent doing patient education regarding hospital follow up ,health maintenance including symptom/medication management.   Estil Heman, NP

## 2024-02-06 NOTE — Telephone Encounter (Signed)
 FMLA paper work brought in during visit completed.Please call [phone] for pick up as requested. Paper work given to ADM.staff at the front desk.

## 2024-02-07 ENCOUNTER — Ambulatory Visit: Admitting: Emergency Medicine

## 2024-02-07 LAB — MICROALBUMIN / CREATININE URINE RATIO
Creatinine, Urine: 208 mg/dL (ref 20–320)
Microalb Creat Ratio: 18 mg/g{creat} (ref <30)
Microalb, Ur: 3.8 mg/dL

## 2024-02-07 NOTE — Telephone Encounter (Signed)
 noted

## 2024-02-13 ENCOUNTER — Ambulatory Visit (HOSPITAL_COMMUNITY)

## 2024-02-14 ENCOUNTER — Ambulatory Visit: Payer: Self-pay | Admitting: Family

## 2024-02-14 ENCOUNTER — Ambulatory Visit: Payer: Self-pay | Admitting: Orthopedic Surgery

## 2024-02-20 ENCOUNTER — Ambulatory Visit
Admission: RE | Admit: 2024-02-20 | Discharge: 2024-02-20 | Disposition: A | Discharge status: Home / Self Care | Source: Ambulatory Visit | Attending: Physician Assistant | Admitting: Physician Assistant

## 2024-02-25 ENCOUNTER — Ambulatory Visit: Payer: Self-pay | Admitting: Orthopedic Surgery

## 2024-02-27 ENCOUNTER — Ambulatory Visit: Payer: Self-pay | Admitting: Physician Assistant

## 2024-02-27 MED ORDER — MEMANTINE HCL 5 MG PO TABS
ORAL_TABLET | ORAL | 3 refills | Status: DC
Start: 2024-02-27 — End: 2024-07-11

## 2024-03-10 ENCOUNTER — Ambulatory Visit: Admitting: Urology

## 2024-03-12 ENCOUNTER — Ambulatory Visit: Admitting: Dermatology

## 2024-03-13 ENCOUNTER — Ambulatory Visit: Admitting: Podiatry

## 2024-03-24 ENCOUNTER — Ambulatory Visit (HOSPITAL_COMMUNITY): Payer: Self-pay

## 2024-03-24 ENCOUNTER — Encounter (HOSPITAL_COMMUNITY): Payer: Self-pay

## 2024-03-25 ENCOUNTER — Ambulatory Visit: Admitting: Sports Medicine

## 2024-04-08 ENCOUNTER — Ambulatory Visit (HOSPITAL_COMMUNITY)
Admission: RE | Admit: 2024-04-08 | Discharge: 2024-04-08 | Disposition: A | Discharge status: Home / Self Care | Source: Ambulatory Visit | Attending: Cardiology | Admitting: Cardiology

## 2024-04-08 DIAGNOSIS — I4892 Unspecified atrial flutter: Secondary | ICD-10-CM | POA: Insufficient documentation

## 2024-04-08 LAB — ECHOCARDIOGRAM COMPLETE: S' Lateral: 3.2 cm

## 2024-04-09 ENCOUNTER — Ambulatory Visit: Admitting: Dermatology

## 2024-04-09 ENCOUNTER — Encounter: Payer: Self-pay | Admitting: Dermatology

## 2024-04-09 ENCOUNTER — Ambulatory Visit: Payer: Self-pay | Admitting: Cardiology

## 2024-04-09 DIAGNOSIS — W908XXA Exposure to other nonionizing radiation, initial encounter: Secondary | ICD-10-CM | POA: Diagnosis not present

## 2024-04-09 DIAGNOSIS — L820 Inflamed seborrheic keratosis: Secondary | ICD-10-CM | POA: Diagnosis not present

## 2024-04-09 DIAGNOSIS — L570 Actinic keratosis: Secondary | ICD-10-CM

## 2024-04-09 DIAGNOSIS — D492 Neoplasm of unspecified behavior of bone, soft tissue, and skin: Secondary | ICD-10-CM | POA: Diagnosis not present

## 2024-04-09 DIAGNOSIS — D485 Neoplasm of uncertain behavior of skin: Secondary | ICD-10-CM

## 2024-04-09 NOTE — Progress Notes (Signed)
 Follow-Up Visit   Subjective  Dennis Hill is a 78 y.o. male who presents for the following: Patient is accompanied by his daughter Dennis Hill.   Lesion on his left posterior ear has been present for several  months. He picks at the area and it bleeds. Negative for pain.   Patient was last seen by a Dermatologist several years ago.   There is a scab on his right lower leg. Patient reports that he got his leg caught in the door about a month ago and that it is a healing wound.   The following portions of the chart were reviewed this encounter and updated as appropriate: medications, allergies, medical history  Review of Systems:  No other skin or systemic complaints except as noted in HPI or Assessment and Plan.  Objective  Well appearing patient in no apparent distress; mood and affect are within normal limits.  A focused examination was performed of the following areas: Face and right leg Right arm  Relevant exam findings are noted in the Assessment and Plan.  Left Occipital Scalp Stuck-on verrucous, tan-brown papules and plaque  Right Upper Arm - Posterior, Right Zygomatic Area Erythematous thin papules/macules with gritty scale.  Right Lower Leg - Anterior 8mm hyperkeratotic papule   Assessment & Plan   ACTINIC KERATOSIS Exam: Erythematous thin papules/macules with gritty scale  Actinic keratoses are precancerous spots that appear secondary to cumulative UV radiation exposure/sun exposure over time. They are chronic with expected duration over 1 year. A portion of actinic keratoses will progress to squamous cell carcinoma of the skin. It is not possible to reliably predict which spots will progress to skin cancer and so treatment is recommended to prevent development of skin cancer.  Recommend daily broad spectrum sunscreen SPF 30+ to sun-exposed areas, reapply every 2 hours as needed.  Recommend staying in the shade or wearing long sleeves, sun glasses (UVA+UVB  protection) and wide brim hats (4-inch brim around the entire circumference of the hat). Call for new or changing lesions.  Treatment Plan:  Prior to procedure, discussed risks of blister formation, small wound, skin dyspigmentation, or rare scar following cryotherapy. Recommend Vaseline ointment to treated areas while healing.  Destruction Procedure Note Destruction method: cryotherapy   Informed consent: discussed and consent obtained   Lesion destroyed using liquid nitrogen: Yes   Outcome: patient tolerated procedure well with no complications   Post-procedure details: wound care instructions given   Locations: left posterior ear and right preauricular # of Lesions Treated: 2   INFLAMED SEBORRHEIC KERATOSIS Left Occipital Scalp Destruction of lesion - Left Occipital Scalp Complexity: simple   Destruction method: cryotherapy   Timeout:  patient name, date of birth, surgical site, and procedure verified Lesion destroyed using liquid nitrogen: Yes   Region frozen until ice ball extended beyond lesion: Yes   Outcome: patient tolerated procedure well with no complications   Post-procedure details: wound care instructions given    AK (ACTINIC KERATOSIS) (2) Right Upper Arm - Posterior, Right Zygomatic Area Destruction of lesion - Right Upper Arm - Posterior, Right Zygomatic Area Complexity: simple   Destruction method: cryotherapy   Informed consent: discussed and consent obtained   Timeout:  patient name, date of birth, surgical site, and procedure verified Lesion destroyed using liquid nitrogen: Yes   Region frozen until ice ball extended beyond lesion: Yes   Outcome: patient tolerated procedure well with no complications   Post-procedure details: wound care instructions given    NEOPLASM OF UNCERTAIN  BEHAVIOR OF SKIN Right Lower Leg - Anterior Skin / nail biopsy Type of biopsy: tangential   Informed consent: discussed and consent obtained   Timeout: patient name, date of  birth, surgical site, and procedure verified   Anesthesia: the lesion was anesthetized in a standard fashion   Anesthetic:  1% lidocaine  w/ epinephrine  1-100,000 buffered w/ 8.4% NaHCO3 Instrument used: DermaBlade   Outcome: patient tolerated procedure well   Post-procedure details: wound care instructions given    Specimen 1 - Surgical pathology Differential Diagnosis: r/o NMSC vs other  Check Margins: No  Return for skin exam.  I, [name], RN, am acting as scribe for RUFUS CHRISTELLA HOLY, MD .   Documentation: I have reviewed the above documentation for accuracy and completeness, and I agree with the above.  RUFUS CHRISTELLA HOLY, MD

## 2024-04-09 NOTE — Patient Instructions (Addendum)

## 2024-04-10 ENCOUNTER — Encounter: Payer: Self-pay | Admitting: Physician Assistant

## 2024-04-10 ENCOUNTER — Ambulatory Visit: Admitting: Physician Assistant

## 2024-04-10 ENCOUNTER — Encounter: Payer: Self-pay | Admitting: Podiatry

## 2024-04-10 ENCOUNTER — Ambulatory Visit: Admitting: Podiatry

## 2024-04-10 ENCOUNTER — Other Ambulatory Visit (INDEPENDENT_AMBULATORY_CARE_PROVIDER_SITE_OTHER): Payer: Self-pay

## 2024-04-10 DIAGNOSIS — E1142 Type 2 diabetes mellitus with diabetic polyneuropathy: Secondary | ICD-10-CM

## 2024-04-10 DIAGNOSIS — M1711 Unilateral primary osteoarthritis, right knee: Secondary | ICD-10-CM | POA: Diagnosis not present

## 2024-04-10 DIAGNOSIS — M79675 Pain in left toe(s): Secondary | ICD-10-CM | POA: Diagnosis not present

## 2024-04-10 DIAGNOSIS — M25561 Pain in right knee: Secondary | ICD-10-CM | POA: Diagnosis not present

## 2024-04-10 DIAGNOSIS — B351 Tinea unguium: Secondary | ICD-10-CM

## 2024-04-10 DIAGNOSIS — M79674 Pain in right toe(s): Secondary | ICD-10-CM

## 2024-04-10 DIAGNOSIS — G8929 Other chronic pain: Secondary | ICD-10-CM

## 2024-04-10 LAB — SURGICAL PATHOLOGY

## 2024-04-10 MED ORDER — METHYLPREDNISOLONE ACETATE 40 MG/ML IJ SUSP
40.0000 mg | INTRAMUSCULAR | Status: AC | PRN
  Administered 2024-04-10: 40 mg via INTRA_ARTICULAR

## 2024-04-10 MED ORDER — LIDOCAINE HCL 1 % IJ SOLN
3.0000 mL | INTRAMUSCULAR | Status: AC | PRN
  Administered 2024-04-10: 3 mL

## 2024-04-10 NOTE — Progress Notes (Addendum)
 HPI: Dennis Hill is an 78 year old male were seen for the first time for right knee pain.  He has history of left total knee arthroplasty done by Dr. Dalldorf 02/22/2016.  States left knee is doing well.  Right knee pain has been ongoing for the past 6 months.  Anterior to the lateral knee pain.  No injury no fall.  No swelling.  Knee gives way at times.  He does have history of atrial flutter and A-fib and is on chronic Eliquis .  He does not wish to undergo any type of surgical procedure for the knee if possible.  He has been taking Tylenol  for the knee pain.  He is nondiabetic.  Denies any fevers chills or ongoing infection.  Review of systems: See HPI otherwise negative  Physical exam: General Well-developed well-nourished male no acute distress able to get on and off the exam table on his own.  He ambulates without any assistive device.  Slight antalgic gait. Respirations: Unlabored Psych: Alert and oriented x 3 Bilateral knees: Good range of motion of both knees.  Left knee well-healed surgical incision.  No abnormal warmth erythema or effusion left knee.  Right knee slight effusion no abnormal warmth or erythema.  Patellofemoral crepitus with range of motion.  No instability valgus varus stressing of either knee.  Radiographs: AP view right knee lateral view of the right knee show no acute fracture knee is well located.  Significant patellofemoral arthritis.  Bone-on-bone medial compartment.  Moderate lateral compartmental arthritis with osteophytes off the lateral margin.  No bony abnormalities otherwise.  Impression: Right knee tricompartmental arthritis.  Plan: Will have him work on quad strengthening exercises discussed.  Knee friendly exercises discussed.  Offered aspiration injection in his knee today he was agreeable.  He will follow-up with us  on an as-needed basis pain persist or becomes worse.  He understands to wait least 3 months between cortisone injections.       Procedure  Note  Patient: Dennis Hill             Date of Birth: [birth year]           MRN: [id number]             Visit Date: 04/10/2024  Procedures: Visit Diagnoses:  1. Primary osteoarthritis of right knee     Large Joint Inj: R knee on 04/10/2024 6:28 PM Indications: pain Details: 22 G 1.5 in needle, anterolateral approach  Arthrogram: No  Medications: 3 mL lidocaine  1 %; 40 mg methylPREDNISolone  acetate 40 MG/ML Aspirate: 17 mL yellow Outcome: tolerated well, no immediate complications Procedure, treatment alternatives, risks and benefits explained, specific risks discussed. Consent was given by the patient. Immediately prior to procedure a time out was called to verify the correct patient, procedure, equipment, support staff and site/side marked as required. Patient was prepped and draped in the usual sterile fashion.

## 2024-04-10 NOTE — Progress Notes (Signed)
  Subjective:  Patient ID: Dennis Hill, male    DOB: 05-01-1946,   MRN: [id number]  Chief Complaint  Patient presents with   Diabetes    His primary care wanted his feet checked and his nails clipped. Roxan Razor, NP - 02/06/2024; A1c - 6.4    78 y.o. male presents for concern of thickened elongated and painful nails that are difficult to trim. Requesting to have them trimmed today. Relates burning and tingling in their feet. Patient is diabetic and last A1c was  Lab Results  Component Value Date   HGBA1C 6.4 (H) 12/13/2023   .   PCP:  [name], Roxan BROCKS, NP    . Denies any other pedal complaints. Denies n/v/f/c.   Past Medical History:  Diagnosis Date   Anxiety    Arthritis    Atrial bigeminy 09/18/2006   Dr. 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 CFT <3 seconds. Absent hair growth on digits. Edema noted to bilateral lower extremities. Xerosis noted bilaterally.  Skin. No lacerations or abrasions bilateral feet. Nails 1-5 bilateral  are thickened discolored and elongated with subungual debris.  Musculoskeletal: MMT 5/5 bilateral lower extremities  in DF, PF, Inversion and Eversion. Deceased ROM in DF of ankle joint.  Neurological: Sensation intact to light touch. Protective sensation diminished bilateral.    Assessment:   1. Pain due to onychomycosis of toenails of both feet   2. Type 2 diabetes mellitus with peripheral neuropathy (HCC)      Plan:  Patient was evaluated and treated and all questions answered. -Discussed and educated patient on diabetic foot care, especially with  regards to the vascular, neurological and musculoskeletal systems.  -Stressed the importance of good glycemic control and the detriment of not  controlling glucose levels in relation to the foot. -Discussed supportive shoes at all times and checking feet regularly.  -Mechanically debrided all nails 1-5 bilateral using sterile nail nipper and filed with dremel without incident  -Answered all patient questions -Patient to return  in 3 months for at risk foot care -Patient advised to call the office if any problems or questions arise in the meantime.   Asberry Failing, DPM

## 2024-04-12 ENCOUNTER — Ambulatory Visit: Payer: Self-pay | Admitting: Dermatology

## 2024-04-18 NOTE — Telephone Encounter (Signed)
 I have printed forms. Message routed to covering provider Jereld Delude, NP. Please advise.

## 2024-04-24 NOTE — Telephone Encounter (Signed)
 This patient is asking for a DNR, please advise if an appointment is needed or not for this to be filled out

## 2024-05-01 ENCOUNTER — Ambulatory Visit (INDEPENDENT_AMBULATORY_CARE_PROVIDER_SITE_OTHER): Admitting: Family

## 2024-05-01 VITALS — BP 110/72 | HR 87 | Temp 97.8°F | Resp 17 | Ht 77.0 in | Wt 229.2 lb

## 2024-05-01 DIAGNOSIS — Z23 Encounter for immunization: Secondary | ICD-10-CM | POA: Diagnosis not present

## 2024-05-01 DIAGNOSIS — R911 Solitary pulmonary nodule: Secondary | ICD-10-CM

## 2024-05-01 DIAGNOSIS — I5022 Chronic systolic (congestive) heart failure: Secondary | ICD-10-CM

## 2024-05-01 DIAGNOSIS — I482 Chronic atrial fibrillation, unspecified: Secondary | ICD-10-CM | POA: Diagnosis not present

## 2024-05-01 DIAGNOSIS — E1165 Type 2 diabetes mellitus with hyperglycemia: Secondary | ICD-10-CM | POA: Diagnosis not present

## 2024-05-01 DIAGNOSIS — N400 Enlarged prostate without lower urinary tract symptoms: Secondary | ICD-10-CM

## 2024-05-01 NOTE — Patient Instructions (Signed)
 1.Stop by check out and schedule Annual Wellness Visit.

## 2024-05-01 NOTE — Progress Notes (Signed)
 Provider: Roxan Plough FNP-C   Kaislee Chao, Roxan BROCKS, NP  Patient Care Team: Alga Southall, Roxan BROCKS, NP as PCP - General (Family Medicine) [name], MD as PCP - Electrophysiology (Cardiology)  Extended Emergency Contact Information Primary Emergency Contact: [name] Address: [address]          [place], [place] [address] [place] Mobile Phone: [phone] Relation: Daughter Secondary Emergency Contact: Rosalina Dorthea [place], [place] [address] [place] Phone: [phone] Relation: Other  Code Status:  Full Code  Goals of care: Advanced Directive information    05/01/2024   10:58 AM  Advanced Directives  Does Patient Have a Medical Advance Directive? Yes  Type of Estate agent of [place];Living will  Does patient want to make changes to medical advance directive? No - Patient declined  Copy of Healthcare Power of Attorney in Chart? Yes - validated most recent copy scanned in chart (See row information)     Chief Complaint  Patient presents with   Medical Management of Chronic Issues    6 month follow up     Discussed the use of AI scribe software for clinical note transcription with the patient, who gave verbal consent to proceed.  History of Present Illness   Dennis Hill is an 78 year old male with prediabetes and neuropathy who presents for a six-month follow-up. He is accompanied by his daughter, who is also his caregiver.  He experiences numbness and pain in his right knee, radiating from his lower back down to his foot, described as 'hot blood going up and down.' An x-ray of the knee was done three weeks ago, and he received a steroid injection from an orthopedic specialist, which provided slight relief. He is aware of 'bone on bone' in his knee. He experiences periodic numbness and tingling in his feet.  He has a history of elevated blood sugars, with an A1c of 6.6 eight years ago,  which decreased to 6.4 four months ago, indicating prediabetes. He does not monitor his blood sugars at home. He performs some exercises, such as sitting in a chair and lifting his leg, though it is uncomfortable. He walks frequently and has lost weight from 232 pounds to 229 pounds, which helps with fluid retention.  He experiences urinary incontinence, which is worsening. His daughter is considering obtaining incontinence supplies for him.  He has chronic congestive heart failure but no recent swelling in his legs, shortness of breath, or cough. He continues to take Eliquis  5 mg twice daily and reports no palpitations or irregular heartbeat.  He is currently taking several medications: Voltaren  gel for knee pain, B complex vitamin, Coenzyme Q10, Tylenol  as needed, Zyrtec  10 mg daily, Eliquis  5 mg twice daily, donepezil  10 mg daily, and Namenda  5 mg daily. He recently had his Eliquis  refilled by his cardiologist.    Past Medical History:  Diagnosis Date   Anxiety    Arthritis    Atrial bigeminy 09/18/2006   Dr. 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   Fatty liver    GERD 03/15/2009  no medications now   Headache(784.0)    monthly (03/06/2013)   Hx of adenomatous colonic polyps 03/15/2009   Hx of cardiovascular stress test    ETT-Myoview (9/15):  Inferior infarct, no ischemia, EF 41% - Intermediate Risk   Hyperlipidemia 02/10/2011   NEPHROLITHIASIS, HX OF 01/28/2010   NICM (nonischemic cardiomyopathy) (HCC)    Echo (10/15):  Mild LVH, EF 35-40%, diff HK   Pneumonia ~ 1950   Sleep apnea    Past Surgical History:  Procedure Laterality Date   APPENDECTOMY     ATRIAL FLUTTER  ABLATION  03/06/2013   ATRIAL FLUTTER ABLATION N/A 03/06/2013   Procedure: ATRIAL FLUTTER ABLATION;  Surgeon: Elspeth JAYSON Sage, MD;  Location: [facility] CATH LAB;  Service: Cardiovascular;  Laterality: N/A;   CARDIOVERSION N/A 02/06/2013   Procedure: CARDIOVERSION;  Surgeon: Danelle LELON Birmingham, MD;  Location: [facility] ENDOSCOPY;  Service: Cardiovascular;  Laterality: N/A;   CHOLECYSTECTOMY  09/18/2004   INGUINAL HERNIA REPAIR Right 09/18/1984   JOINT REPLACEMENT     LEFT HEART CATHETERIZATION WITH CORONARY ANGIOGRAM N/A 06/19/2014   Procedure: LEFT HEART CATHETERIZATION WITH CORONARY ANGIOGRAM;  Surgeon: Lonni JONETTA Cash, MD;  Location: [facility] CATH LAB;  Service: Cardiovascular;  Laterality: N/A;   TEE WITHOUT CARDIOVERSION N/A 03/05/2013   Procedure: TRANSESOPHAGEAL ECHOCARDIOGRAM (TEE);  Surgeon: Ezra GORMAN Shuck, MD;  Location: [facility] ENDOSCOPY;  Service: Cardiovascular;  Laterality: N/A;  pam rogue   TONSILLECTOMY AND ADENOIDECTOMY  ~ 1952   TOTAL KNEE ARTHROPLASTY Left 02/22/2016   Procedure: TOTAL KNEE ARTHROPLASTY;  Surgeon: Maude Herald, MD;  Location: [facility] OR;  Service: Orthopedics;  Laterality: Left;    Allergies  Allergen Reactions   Lisinopril  Swelling    Tongue swelling   Xarelto  [Rivaroxaban ] Swelling    Tongue swelling   Lipitor [Atorvastatin ] Other (See Comments)    Mental foggy    Allergies as of 05/01/2024       Reactions   Lisinopril  Swelling   Tongue swelling   Xarelto  [rivaroxaban ] Swelling   Tongue swelling   Lipitor [atorvastatin ] Other (See Comments)   Mental foggy        Medication List        Accurate as of May 01, 2024 12:17 PM. If you have any questions, ask your nurse or doctor.          acetaminophen  325 MG tablet Commonly known as: TYLENOL  Take 650 mg by mouth every 6 (six) hours as needed for moderate pain (pain score 4-6).   apixaban  5 MG Tabs tablet Commonly known as: Eliquis  Take 1 tablet (5 mg total) by mouth 2 (two) times daily.   b complex  vitamins capsule Take 1 capsule by mouth daily.   cetirizine  10 MG tablet Commonly known as: ZYRTEC  Take 10 mg by mouth daily.   co-enzyme Q-10 30 MG capsule Take 50 mg by mouth daily.   diclofenac  sodium 1 % Gel Commonly known as: Voltaren  Apply 4 g topically 4 (four) times daily as needed.   donepezil  10 MG tablet Commonly known as: ARICEPT  Take one tab at bedtime   memantine  5 MG tablet Commonly known as: NAMENDA  Take 1 tablet (5 mg at night) for 2 weeks, then increase to 1 tablet (5 mg) twice a day        Review of Systems  Constitutional:  Negative for appetite change, chills, fatigue, fever and unexpected weight change.  HENT:  Negative for congestion, dental problem, ear discharge, ear pain, facial swelling, hearing loss, nosebleeds, postnasal drip,  rhinorrhea, sinus pressure, sinus pain, sneezing, sore throat, tinnitus and trouble swallowing.   Eyes:  Negative for pain, discharge, redness, itching and visual disturbance.  Respiratory:  Negative for cough, chest tightness, shortness of breath and wheezing.   Cardiovascular:  Negative for chest pain, palpitations and leg swelling.  Gastrointestinal:  Negative for abdominal distention, abdominal pain, blood in stool, constipation, diarrhea, nausea and vomiting.  Endocrine: Negative for cold intolerance, heat intolerance, polydipsia, polyphagia and polyuria.  Genitourinary:  Negative for difficulty urinating, dysuria, flank pain, frequency and urgency.       Urine incontinence   Musculoskeletal:  Positive for arthralgias. Negative for back pain, gait problem, joint swelling, myalgias, neck pain and neck stiffness.       Right knee pain   Skin:  Negative for color change, pallor, rash and wound.  Neurological:  Positive for numbness. Negative for dizziness, syncope, speech difficulty, weakness, light-headedness and headaches.  Hematological:  Does not bruise/bleed easily.  Psychiatric/Behavioral:  Negative for agitation,  behavioral problems, confusion, hallucinations, self-injury, sleep disturbance and suicidal ideas. The patient is not nervous/anxious.     Immunization History  Administered Date(s) Administered   Influenza, High Dose Seasonal PF 07/27/2017   Moderna Sars-Covid-2 Vaccination 11/10/2019, 12/08/2019   PPD Test 02/24/2016   Pneumococcal Conjugate-13 01/09/2014   Pneumococcal Polysaccharide-23 03/15/2009   Td 03/15/2009   Tdap 05/01/2024   Pertinent  Health Maintenance Due  Topic Date Due   INFLUENZA VACCINE  07/01/2024 (Originally 04/18/2024)   HEMOGLOBIN A1C  06/14/2024   OPHTHALMOLOGY EXAM  01/01/2025   FOOT EXAM  04/10/2025   Colonoscopy  Discontinued      01/15/2015    8:38 AM 03/07/2017    3:40 PM 12/07/2023    2:27 PM 02/05/2024    1:37 PM 05/01/2024   10:58 AM  Fall Risk  Falls in the past year? No  No  1 0 0  Was there an injury with Fall?   0 0 0  Fall Risk Category Calculator   1 0 0  Patient at Risk for Falls Due to   History of fall(s)  No Fall Risks  Fall risk Follow up   Falls evaluation completed Falls evaluation completed Falls evaluation completed     Data saved with a previous flowsheet row definition   Functional Status Survey:    Vitals:   05/01/24 1100  BP: 110/72  Pulse: 87  Resp: 17  Temp: 97.8 F (36.6 C)  SpO2: 98%  Weight: 229 lb 3.2 oz (104 kg)  Height: 6' 5 (1.956 m)   Body mass index is 27.18 kg/m. Physical Exam  MEASUREMENTS: Weight- 229. GENERAL: Alert, cooperative, well developed, no acute distress HEENT: Normocephalic, normal oropharynx, moist mucous membranes, ears normal, nose normal, vision grossly intact, no sinus tenderness CHEST: Clear to auscultation bilaterally, no wheezes, rhonchi, or crackles CARDIOVASCULAR: Normal heart rate and rhythm, S1 and S2 normal without murmurs ABDOMEN: Soft, non-tender, non-distended, without organomegaly, normal bowel sounds EXTREMITIES: No cyanosis or edema NEUROLOGICAL: Cranial nerves grossly  intact, moves all extremities without gross motor or sensory deficit SKIN: No rash,no lesion or erythema   PSYCHIATRY/BEHAVIORAL: Mood stable   Labs reviewed: Recent Labs    12/13/23 0824 02/06/24 1119  NA 144 141  K 5.7* 5.1  CL 108 106  CO2 30 30  GLUCOSE 116* 106  BUN 24 20  CREATININE 1.23 1.13  CALCIUM  10.2 9.9   Recent Labs    12/13/23 0824 02/06/24 1119  AST 14 19  ALT 14 26  BILITOT 0.5 0.6  PROT 6.5 6.4   Recent Labs    12/13/23 0824 02/06/24 1119  WBC 7.4 8.3  NEUTROABS 4,329 5,212  HGB 15.4 13.6  HCT 46.1 42.0  MCV 87.6 88.6  PLT 232 333   Lab Results  Component Value Date   TSH 2.46 12/13/2023   Lab Results  Component Value Date   HGBA1C 6.4 (H) 12/13/2023   Lab Results  Component Value Date   CHOL 142 12/13/2023   HDL 39 (L) 12/13/2023   LDLCALC 84 12/13/2023   LDLDIRECT 87.2 09/05/2013   TRIG 94 12/13/2023   CHOLHDL 3.6 12/13/2023    Significant Diagnostic Results in last 30 days:  XR Knee 1-2 Views Right Result Date: 04/10/2024 Radiographs: AP view right knee lateral view of the right knee show no acute fracture knee is well located.  Significant patellofemoral arthritis.  Bone-on-bone medial compartment.  Moderate lateral compartmental arthritis with osteophytes off the lateral margin.  No bony abnormalities otherwise.  ECHOCARDIOGRAM COMPLETE Result Date: 04/08/2024    ECHOCARDIOGRAM REPORT   Patient Name:   KIRTAN SADA Date of Exam: 04/08/2024 Medical Rec #:  [id number]            Height:       77.0 in Accession #:    [id number]           Weight:       231.6 lb Date of Birth:  [birth year]            BSA:          2.381 m Patient Age:    78 years             BP:           104/60 mmHg Patient Gender: M                    HR:           73 bpm. Exam Location:  [address] Procedure: 2D Echo, Cardiac Doppler and Color Doppler (Both Spectral and Color            Flow Doppler were utilized during procedure). Indications:    Atrial  Fibrillation I48.92  History:        Patient has prior history of Echocardiogram examinations, most                 recent 07/05/2015. NICM, CAD; Risk Factors:Dyslipidemia.  Sonographer:    Augustin Seals RDCS Referring Phys: [id number] JOSHUA PARKER IMPRESSIONS  1. Left ventricular ejection fraction, by estimation, is 55 to 60%. The left ventricle has normal function. The left ventricle has no regional wall motion abnormalities. Left ventricular diastolic parameters were normal.  2. Right ventricular systolic function is normal. The right ventricular size is normal.  3. The mitral valve is normal in structure. No evidence of mitral valve regurgitation. No evidence of mitral stenosis.  4. The aortic valve is tricuspid. Aortic valve regurgitation is not visualized. Aortic valve sclerosis is present, with no evidence of aortic valve stenosis.  5. The inferior vena cava is normal in size with greater than 50% respiratory variability, suggesting right atrial pressure of 3 mmHg. Comparison(s): No prior Echocardiogram. FINDINGS  Left Ventricle: Left ventricular ejection fraction, by estimation, is 55 to 60%. The left ventricle has normal function. The left ventricle has no regional wall motion abnormalities. The left ventricular internal cavity size was normal in size. There is  no  left ventricular hypertrophy. Left ventricular diastolic parameters were normal. Right Ventricle: The right ventricular size is normal. Right ventricular systolic function is normal. Left Atrium: Left atrial size was normal in size. Right Atrium: Right atrial size was normal in size. Pericardium: There is no evidence of pericardial effusion. Mitral Valve: The mitral valve is normal in structure. Mild mitral annular calcification. No evidence of mitral valve regurgitation. No evidence of mitral valve stenosis. Tricuspid Valve: The tricuspid valve is normal in structure. Tricuspid valve regurgitation is trivial. No evidence of tricuspid stenosis.  Aortic Valve: The aortic valve is tricuspid. Aortic valve regurgitation is not visualized. Aortic valve sclerosis is present, with no evidence of aortic valve stenosis. Pulmonic Valve: The pulmonic valve was not well visualized. Pulmonic valve regurgitation is not visualized. No evidence of pulmonic stenosis. Aorta: The aortic root is normal in size and structure. Venous: The inferior vena cava is normal in size with greater than 50% respiratory variability, suggesting right atrial pressure of 3 mmHg. IAS/Shunts: The interatrial septum was not well visualized.  LEFT VENTRICLE PLAX 2D LVIDd:         4.70 cm   Diastology LVIDs:         3.20 cm   LV e' medial:  9.03 cm/s LV PW:         1.00 cm   LV e' lateral: 10.00 cm/s LV IVS:        1.00 cm LVOT diam:     2.20 cm LV SV:         46 LV SV Index:   19 LVOT Area:     3.80 cm  RIGHT VENTRICLE RV S prime:     11.00 cm/s TAPSE (M-mode): 2.4 cm LEFT ATRIUM             Index        RIGHT ATRIUM           Index LA diam:        3.40 cm 1.43 cm/m   RA Area:     17.00 cm LA Vol (A2C):   41.0 ml 17.22 ml/m  RA Volume:   41.40 ml  17.39 ml/m LA Vol (A4C):   62.0 ml 26.04 ml/m LA Biplane Vol: 54.1 ml 22.72 ml/m  AORTIC VALVE LVOT Vmax:   72.45 cm/s LVOT Vmean:  46.600 cm/s LVOT VTI:    0.122 m  AORTA Ao Root diam: 3.60 cm Ao Asc diam:  3.60 cm  SHUNTS Systemic VTI:  0.12 m Systemic Diam: 2.20 cm Redell Shallow MD Electronically signed by Redell Shallow MD Signature Date/Time: 04/08/2024/4:00:34 PM    Final     Assessment/Plan  Right Knee Osteoarthritis Chronic right knee osteoarthritis with bone-on-bone changes. He received a steroid injection with some relief, but significant improvement is not expected. Discomfort occurs, especially after prolonged sitting, managed with chair exercises. - Continue chair exercises to maintain mobility - Consider Voltaren  gel for knee pain as needed  Peripheral Neuropathy Peripheral neuropathy likely secondary to prediabetes,  causing numbness and tingling in the feet and lower legs. Periodic numbness and pain radiate from the lower back to the foot, consistent with neuropathic pain, exacerbated by elevated blood glucose levels. - Monitor blood glucose levels to prevent neuropathy progression - Encourage dietary modifications to manage blood glucose levels  Prediabetes HbA1c was 6.4% four months ago, indicating prediabetes. Recent glucose levels have improved, suggesting dietary compliance. Reducing sugar and carbohydrate intake is crucial to prevent progression to diabetes and worsening neuropathy. -  Encourage reduction of sugar and carbohydrate intake - Repeat lab work in six months to monitor glucose and HbA1c levels  Congestive Heart Failure Chronic congestive heart failure. He reports no swelling, shortness of breath, or cough. Weight decreased from 232 lbs to 229 lbs, beneficial for fluid management. - Continue current heart failure management regimen  Urinary Incontinence Experiences worsening urinary incontinence. Family is considering incontinence products and inquiring about insurance coverage. - Check with [employer] regarding coverage for incontinence supplies - Consider ordering incontinence supplies if covered  Lung Nodule Lung nodule requires follow-up with a pulmonary specialist. Appointment not yet scheduled. - Schedule follow-up appointment with pulmonary specialist for lung nodule evaluation  Goals of Care Desires a Do Not Resuscitate (DNR) order and is considering additional directives regarding feeding tubes and other interventions. Reviewing the MOLST form to make informed decisions about future care preferences. - Provide MOLST form for review and discussion - Complete and document DNR and other advance directives as decided  Follow-up Routine follow-up necessary to monitor chronic conditions and ensure compliance with treatment plans. - Schedule follow-up appointment in six  months with lab work prior to the visit   Family/ staff Communication: Reviewed plan of care with patient verbalized understanding   Labs/tests ordered:  - CBC with Differential/Platelet - CMP with eGFR([facility]) - TSH - Hgb A1C - Lipid panel  Next Appointment : Return in about 6 months (around 11/01/2024) for medical mangement of chronic issues., Fasting labs in 6 months prior to visit.   Spent 30 minutes of Face to face and non-face to face with patient  >50% time spent counseling; reviewing medical record; tests; labs; documentation and developing future plan of care.   Roxan JAYSON Plough, NP

## 2024-05-02 ENCOUNTER — Ambulatory Visit: Admitting: Family

## 2024-05-21 ENCOUNTER — Encounter: Payer: Self-pay | Admitting: Physician Assistant

## 2024-05-21 ENCOUNTER — Other Ambulatory Visit (INDEPENDENT_AMBULATORY_CARE_PROVIDER_SITE_OTHER): Payer: Self-pay

## 2024-05-21 ENCOUNTER — Ambulatory Visit (INDEPENDENT_AMBULATORY_CARE_PROVIDER_SITE_OTHER): Admitting: Physician Assistant

## 2024-05-21 DIAGNOSIS — M5416 Radiculopathy, lumbar region: Secondary | ICD-10-CM

## 2024-05-21 DIAGNOSIS — R2689 Other abnormalities of gait and mobility: Secondary | ICD-10-CM | POA: Diagnosis not present

## 2024-05-21 MED ORDER — METHYLPREDNISOLONE 4 MG PO TABS
ORAL_TABLET | ORAL | 0 refills | Status: DC
Start: 2024-05-21 — End: 2024-07-09

## 2024-05-21 NOTE — Progress Notes (Signed)
 Office Visit Note   Patient: Dennis Hill           Date of Birth: [birth year]           MRN: [id number] Visit Date: 05/21/2024              Requested by: Leonarda Roxan BROCKS, NP [address] [place],  [place] [address] PCP: Leonarda Roxan BROCKS, NP   Assessment & Plan: Visit Diagnoses:  1. Radiculopathy, lumbar region   2. Balance disorder     Plan: Will place him on a Medrol  Dosepak for the radicular symptoms he is having down both legs.  Also he is given a prescription for physical therapy they will work on core strengthening, hamstring stretching, quad strengthening, gait/balance, home exercise program and include modalities.  He will follow-up with Dr. Vernetta in 6 weeks see how he is doing overall.  Questions were encouraged and answered patient and his daughter-in-law who is with him today. He will speak to his primary care physician about the nocturia and also mention dizziness that he is having.  Follow-Up Instructions: Return in about 6 weeks (around 07/02/2024), or Dr. Vernetta.   Orders:  Orders Placed This Encounter  Procedures   XR Lumbar Spine 2-3 Views   Meds ordered this encounter  Medications   methylPREDNISolone  (MEDROL ) 4 MG tablet    Sig: Take as directed    Dispense:  21 tablet    Refill:  0      Procedures: No procedures performed   Clinical Data: No additional findings.   Subjective: Chief Complaint  Patient presents with   Right Leg - Pain   Lower Back - Pain    HPI Patient returns today status post right knee aspiration injection 04/10/2024.  States that shot helped.  However over the last month he has developed radicular symptoms down both legs right greater than left.  States that the pain can radiate down into his feet and his feet do go numb again right worse than left.  He also notes he has some balance issues particularly when standing still like shaving.  He has had no falls.  He is having low back pain that awakens him at night.  He  also is having some nocturia that has been ongoing for at least the last year.  He states the dizziness has been going on for about 2 years.  Denies any fevers chills.  And feels that he may have lost some 8 pounds over the last year.  Outside of the nocturia no other bowel or bladder dysfunction.  No saddle anesthesia like symptoms.   Review of Systems See HPI otherwise negative  Objective: Vital Signs: There were no vitals taken for this visit.  Physical Exam Constitutional:      Appearance: He is normal weight. He is not ill-appearing or diaphoretic.  Neurological:     Mental Status: He is alert.     Ortho Exam Bilateral knees: Good range of motion of both knees.  Patellofemoral crepitus right knee.  No abnormal warmth erythema or effusion of either knee.  Well-healed surgical incision from a left total knee arthroplasty. Lower extremities: 5 out of 5 strength throughout the lower extremities against resistance.  Quad atrophy right compared to the left.  Straight leg raise is negative bilaterally.  Dorsal pedal pulses 2+ and equal symmetric.  Subjective decrease sensation throughout the left foot compared to right.  Bilateral hips good range of motion nontender at the trochanteric region  both hips.  Lumbar spine: Negative straight leg raise bilaterally.  Nontender over the lumbar spinal and column paraspinous region bilaterally.  He is able to touch his toes with flexion lumbar spine.  Lumbar extension limited.  Able to walk on tiptoes and heels.  Unable to perform tandem gait due to balance.  Specialty Comments:  No specialty comments available.  Imaging: XR Lumbar Spine 2-3 Views Result Date: 05/21/2024 Lumbar spine 2 views slight scoliosis.  Degenerative changes throughout.  Significant facet arthritis lower lumbar.  Grade 1 L4-L5 spondylolisthesis.  Loss of disc space at L 1 -L3.  No acute fractures acute findings.  Anterior vertebral spurring throughout.  Arthrosclerosis aorta  noted.    PMFS History: Patient Active Problem List   Diagnosis Date Noted   Chronic systolic CHF (congestive heart failure) (HCC) 02/06/2024   Chronic atrial fibrillation (HCC) 02/06/2024   Memory impairment 02/05/2024   Right upper lobe pulmonary nodule 07/27/2017   Blurred vision, bilateral 07/27/2017   Floaters, right 07/27/2017   Right hand pain 07/27/2017   Arthritis of hand, degenerative 03/07/2017   Constipation 06/22/2016   Anxiety state 06/03/2016   Low back pain 06/02/2016   Primary osteoarthritis of left knee 02/22/2016   Cough 02/10/2016   Acute gout 01/11/2016   Pronation deformity of both feet 11/24/2014   Wheezing 05/30/2014   Thyroid  mass 05/30/2014   Chest pain 05/29/2014   Encounter for preventative adult health care exam with abnormal findings 01/09/2014   Recurrent epistaxis 10/15/2013   Primary localized osteoarthrosis, lower leg 09/15/2013   Angioedema 08/20/2013   Venous insufficiency 08/20/2013   Chronic meniscal tear of knee 08/12/2013   Osteoarthritis of left knee 08/12/2013   Kidney stones 03/21/2013   Benign prostatic hyperplasia 03/21/2013   Gross hematuria 02/21/2013   Allergic rhinitis 02/21/2013   Cardiomyopathy (HCC) 02/04/2013   Obstructive sleep apnea 02/04/2013   Atrial flutter (HCC) 12/11/2012   Left knee pain 12/11/2012   Left sided sciatica 12/11/2012   Raynaud phenomenon 12/11/2012   Increased prostate specific antigen (PSA) velocity 12/11/2012   Hyperlipidemia 02/10/2011   Hypersomnia 12/13/2010   Dysphagia 12/13/2010   Right flank pain 12/13/2010   FOOT PAIN, LEFT 04/05/2010   Hyperglycemia 01/28/2010   Erectile dysfunction 01/28/2010   Continuous RUQ abdominal pain 01/28/2010   NEPHROLITHIASIS, HX OF 01/28/2010   GERD 03/15/2009   Diverticulosis of colon 03/15/2009   ABDOMINAL PAIN OTHER SPECIFIED SITE 03/15/2009   History of colonic polyps 03/15/2009   Past Medical History:  Diagnosis Date   Anxiety    Arthritis     Atrial bigeminy 09/18/2006   Dr. 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   Fatty liver    GERD 03/15/2009   no medications now   Headache(784.0)    monthly (03/06/2013)   Hx of adenomatous colonic polyps 03/15/2009   Hx of cardiovascular stress test    ETT-Myoview (9/15):  Inferior infarct, no ischemia, EF 41% - Intermediate Risk   Hyperlipidemia 02/10/2011   NEPHROLITHIASIS, HX OF 01/28/2010   NICM (nonischemic cardiomyopathy) (HCC)    Echo (10/15):  Mild LVH, EF 35-40%, diff HK  Pneumonia ~ 1950   Sleep apnea     Family History  Problem Relation Age of Onset   Lung cancer Mother 51   Colon cancer Mother    Cancer Mother    Cancer Maternal Grandmother    CAD Neg Hx    Heart attack Neg Hx    Stroke Neg Hx    Diabetes Neg Hx     Past Surgical History:  Procedure Laterality Date   APPENDECTOMY     ATRIAL FLUTTER ABLATION  03/06/2013   ATRIAL FLUTTER ABLATION N/A 03/06/2013   Procedure: ATRIAL FLUTTER ABLATION;  Surgeon: Elspeth JAYSON Sage, MD;  Location: [facility] CATH LAB;  Service: Cardiovascular;  Laterality: N/A;   CARDIOVERSION N/A 02/06/2013   Procedure: CARDIOVERSION;  Surgeon: Danelle LELON Birmingham, MD;  Location: [facility] ENDOSCOPY;  Service: Cardiovascular;  Laterality: N/A;   CHOLECYSTECTOMY  09/18/2004   INGUINAL HERNIA REPAIR Right 09/18/1984   JOINT REPLACEMENT     LEFT HEART CATHETERIZATION WITH CORONARY ANGIOGRAM N/A 06/19/2014   Procedure: LEFT HEART CATHETERIZATION WITH CORONARY ANGIOGRAM;  Surgeon: Lonni JONETTA Cash, MD;  Location: [facility] CATH LAB;  Service: Cardiovascular;  Laterality: N/A;   TEE WITHOUT CARDIOVERSION N/A 03/05/2013   Procedure: TRANSESOPHAGEAL  ECHOCARDIOGRAM (TEE);  Surgeon: Ezra GORMAN Shuck, MD;  Location: [facility] ENDOSCOPY;  Service: Cardiovascular;  Laterality: N/A;  pam rogue   TONSILLECTOMY AND ADENOIDECTOMY  ~ 1952   TOTAL KNEE ARTHROPLASTY Left 02/22/2016   Procedure: TOTAL KNEE ARTHROPLASTY;  Surgeon: Maude Herald, MD;  Location: [facility] OR;  Service: Orthopedics;  Laterality: Left;   Social History   Occupational History   Occupation: Radiographer, therapeutic Rep for [employer]  Tobacco Use   Smoking status: Former    Current packs/day: 0.00    Average packs/day: 1 pack/day for 13.0 years (13.0 ttl pk-yrs)    Types: Cigarettes    Start date: 12/25/1966    Quit date: 12/25/1979    Years since quitting: 44.4   Smokeless tobacco: Never  Vaping Use   Vaping status: Never Used  Substance and Sexual Activity   Alcohol use: Not Currently    Alcohol/week: 1.0 standard drink of alcohol    Types: 1 Cans of beer per week    Comment: 03/06/2013 glass of wine maybe once a month; glass of beer/wk   Drug use: No   Sexual activity: Not Currently

## 2024-06-13 ENCOUNTER — Ambulatory Visit: Admitting: Family

## 2024-06-16 ENCOUNTER — Ambulatory Visit: Admitting: Physician Assistant

## 2024-07-07 ENCOUNTER — Ambulatory Visit: Admitting: Family

## 2024-07-07 ENCOUNTER — Ambulatory Visit: Admitting: Dermatology

## 2024-07-09 ENCOUNTER — Ambulatory Visit: Admitting: Acute Care

## 2024-07-09 ENCOUNTER — Encounter: Payer: Self-pay | Admitting: Acute Care

## 2024-07-09 ENCOUNTER — Encounter: Payer: Self-pay | Admitting: Orthopaedic Surgery

## 2024-07-09 ENCOUNTER — Ambulatory Visit (INDEPENDENT_AMBULATORY_CARE_PROVIDER_SITE_OTHER): Admitting: Orthopaedic Surgery

## 2024-07-09 VITALS — BP 111/68 | HR 77 | Temp 97.6°F | Ht 77.0 in | Wt 232.8 lb

## 2024-07-09 DIAGNOSIS — R918 Other nonspecific abnormal finding of lung field: Secondary | ICD-10-CM

## 2024-07-09 DIAGNOSIS — R9389 Abnormal findings on diagnostic imaging of other specified body structures: Secondary | ICD-10-CM

## 2024-07-09 DIAGNOSIS — R911 Solitary pulmonary nodule: Secondary | ICD-10-CM

## 2024-07-09 DIAGNOSIS — M25561 Pain in right knee: Secondary | ICD-10-CM

## 2024-07-09 DIAGNOSIS — M533 Sacrococcygeal disorders, not elsewhere classified: Secondary | ICD-10-CM | POA: Diagnosis not present

## 2024-07-09 DIAGNOSIS — R2689 Other abnormalities of gait and mobility: Secondary | ICD-10-CM

## 2024-07-09 DIAGNOSIS — Z87891 Personal history of nicotine dependence: Secondary | ICD-10-CM

## 2024-07-09 DIAGNOSIS — M1711 Unilateral primary osteoarthritis, right knee: Secondary | ICD-10-CM

## 2024-07-09 DIAGNOSIS — G8929 Other chronic pain: Secondary | ICD-10-CM

## 2024-07-09 MED ORDER — LIDOCAINE HCL 1 % IJ SOLN
3.0000 mL | INTRAMUSCULAR | Status: AC | PRN
  Administered 2024-07-09: 3 mL

## 2024-07-09 MED ORDER — METHYLPREDNISOLONE ACETATE 40 MG/ML IJ SUSP
40.0000 mg | INTRAMUSCULAR | Status: AC | PRN
  Administered 2024-07-09: 40 mg via INTRA_ARTICULAR

## 2024-07-09 NOTE — Progress Notes (Signed)
 The patient comes in today having had a mechanical fall this past Saturday out of bed.  He stays with family at home and he is mainly homebound and they cannot get him to outpatient physical therapy.  He has had some balance and coordination issues.  We last saw him in July with a steroid injection in his right knee and an aspiration.  He would like to have a aspiration and steroid injection in the right knee today.  His daughter is with him who is a Psychiatrist at the [employer] [facility] system we have known her for years.  He complains of sacral pain and trapezius pain.  The trapezius pain is on the right side.  Examination of his right knee does show a moderate effusion.  I was able to aspirate 30 cc of clear yellow fluid from his knee and placed a steroid injection in the right knee today.  He does have pain in the midline of the sacrum and the low back and pain around the left trapezius.  He is able to lift his shoulders above his head.  He is walking without assistive device.  When he does sit down he does have a lot of pain in his sacrum.  We did not image his sacrum today.  Previous x-rays of the right knee shows tricompartment arthritis with bone-on-bone wear.  He did tolerate the steroid injection on his right knee today.  We will see about getting him set up for home health physical therapy since he is homebound to see if they can work on his balance and coordination and overall conditioning with lower extremity strengthening.  We can always reaspirate and inject the knee again in 3 months.  If he is not getting better from his sacral pain they will get work back in with us  with a x-ray of his pelvis his low back and sacrum.    Procedure Note  Patient: Dennis Hill             Date of Birth: 1945-10-05           MRN: [id number]             Visit Date: 07/09/2024  Procedures: Visit Diagnoses:  1. Balance disorder   2. Primary osteoarthritis of right knee   3. Chronic pain of right knee    4. Sacral pain     Large Joint Inj: R knee on 07/09/2024 10:22 AM Indications: diagnostic evaluation and pain Details: 22 G 1.5 in needle, superolateral approach  Arthrogram: No  Medications: 3 mL lidocaine  1 %; 40 mg methylPREDNISolone  acetate 40 MG/ML Outcome: tolerated well, no immediate complications Procedure, treatment alternatives, risks and benefits explained, specific risks discussed. Consent was given by the patient. Immediately prior to procedure a time out was called to verify the correct patient, procedure, equipment, support staff and site/side marked as required. Patient was prepped and draped in the usual sterile fashion.

## 2024-07-09 NOTE — Patient Instructions (Signed)
 It is good to see you today. We have reviewed the CT scan that was done at [facility] which shows the right upper lobe pulmonary nodule. We will do a dedicated CT of the chest within the next 1 to 2 weeks so that we can review all lobes of your lungs. Follow-up with me 1 to 2 weeks after the scan so we can review the results together. Based on the results of the scan we can determine next best steps to monitor these pulmonary nodules for you. Please call if you need us  sooner. Please contact office for sooner follow up if symptoms do not improve or worsen or seek emergency care

## 2024-07-09 NOTE — Progress Notes (Signed)
 History of Present Illness Dennis Hill is a 78 y.o. male former smoker referred for an incidental finding of a right upper lobe pulmonary nodule. He will be followed by Dr. Shelah.   07/09/2024 Discussed the use of AI scribe software for clinical note transcription with the patient, who gave verbal consent to proceed.  History of Present Illness Dennis Hill is an 78 year old male who presents for follow-up imaging of a right upper lobe pulmonary nodule.  In May 2025, a CT scan of the soft tissue of the neck incidentally revealed a right upper lobe pulmonary nodule, initially measuring 10 by 7 millimeters. A follow-up scan was recommended for three months later. There has not been a repeat scan completed.  He has a history of a right upper lobe nodule first identified in June 2017on a CTA Chest done in the ED. At that time, measuring 8 by 4 millimeters at that time. It was recommended to have a follow-up scan within three to six months, but the nodule has since grown to 10 by 7 millimeters as of the latest scan. No symptoms such as weight loss, hemoptysis, or shortness of breath are reported.  He has a remote smoking history, having smoked a pack a day for approximately 13 years before quitting in 1981. He denies any significant environmental or workplace exposures.  Family history is significant for lung cancer in his mother, who also had colon cancer. His maternal grandmother also had cancer, though the type is unspecified.  He is currently taking Aricept  and is not on any medications that suppress the immune system.  We discussed that best plan moving forward is for a follow up dedicated Ct Chest to evaluate all pulmonary nodules seen on the 2017 CTA  for change , then follow up with me to review the results. Pt. And his daughter ( on the phone) are in agreement with this plan.      Test Results: 01/24/2024 CT Soft Tissue Neck   Dental caries and periapical lucency  within the right mandibular first molar tooth. No subperiosteal abscess. No significant adjacent inflammatory stranding within the soft tissues is identified. Dental caries and periapical lucency within the left maxillary first premolar tooth.  2.  Redundancy of the vocal cords and medialization of the right vocal fold which may be secondary to right-sided vocal cord paralysis. Consider direct inspection to assess for an underlying lesion. No pathologically enlarged, necrotic, or otherwise abnormal lymph nodes.  3.  Indeterminate right upper lobe pulmonary nodule measures 10 x 7 mm (series 3, image 40). Indeterminate right lung apex nodule measures 8 x 5 mm (series 3, image 84).  Excerpt from Guidelines for Management of Incidental Pulmonary Nodules Detected on CT Images: From the Fleischner Society 2017  Radiology 2017.   Note: Guidelines do not apply to lung cancer screening, patients with immunosuppression, or patients with known primary cancer.     CTA Chest 03/10/2016 Negative for PE Apical right upper lobe pulmonary nodules, largest 0.6 cm. Non-contrast chest CT at 3-6 months is recommended. If the nodules are stable at time of repeat CT, then future CT at 18-24 months (from today's scan) is considered optional for low-risk patients, but is recommended for high-risk patients.   Nodule 9mm or greater:  Low Risk: Consider CT at 3 months, PET/CT, or tissue sampling.  High Risk: Consider CT at 3 months, PET/CT, or tissue sampling.       Latest Ref Rng & Units 02/06/2024  11:19 AM 12/13/2023    8:24 AM 03/08/2017    8:47 AM  CBC  WBC 3.8 - 10.8 Thousand/uL 8.3  7.4  6.9   Hemoglobin 13.2 - 17.1 g/dL 86.3  84.5  85.1   Hematocrit 38.5 - 50.0 % 42.0  46.1  44.6   Platelets 140 - 400 Thousand/uL 333  232  207.0        Latest Ref Rng & Units 02/06/2024   11:19 AM 12/13/2023    8:24 AM 03/08/2017    8:47 AM  BMP  Glucose 65 - 139 mg/dL 893  883  880   BUN 7 - 25 mg/dL 20  24  24     Creatinine 0.70 - 1.28 mg/dL 8.86  8.76  8.73   BUN/Creat Ratio 6 - 22 (calc) SEE NOTE:  SEE NOTE:    Sodium 135 - 146 mmol/L 141  144  141   Potassium 3.5 - 5.3 mmol/L 5.1  5.7  4.3   Chloride 98 - 110 mmol/L 106  108  107   CO2 20 - 32 mmol/L 30  30  28    Calcium  8.6 - 10.3 mg/dL 9.9  89.7  9.8     BNP    Component Value Date/Time   BNP 103.3 (H) 03/10/2016 1848    ProBNP No results found for: PROBNP  PFT No results found for: FEV1PRE, FEV1POST, FVCPRE, FVCPOST, TLC, DLCOUNC, PREFEV1FVCRT, PSTFEV1FVCRT  No results found.   Past medical hx Past Medical History:  Diagnosis Date   Anxiety    Arthritis    Atrial bigeminy 09/18/2006   Dr. 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   Fatty liver    GERD 03/15/2009   no medications now   Headache(784.0)    monthly (03/06/2013)   Hx of adenomatous colonic polyps 03/15/2009   Hx of cardiovascular stress test    ETT-Myoview (9/15):  Inferior infarct, no ischemia, EF 41% - Intermediate Risk   Hyperlipidemia 02/10/2011   NEPHROLITHIASIS, HX OF 01/28/2010   NICM (nonischemic cardiomyopathy) (HCC)    Echo (10/15):  Mild LVH, EF 35-40%, diff HK   Pneumonia ~ 1950   Sleep apnea      Social History   Tobacco Use   Smoking status: Former    Current packs/day: 0.00    Average packs/day: 1 pack/day for 13.0 years (13.0 ttl pk-yrs)    Types: Cigarettes    Start date: 12/25/1966    Quit date: 12/25/1979    Years since quitting: 44.5   Smokeless tobacco: Never  Vaping Use   Vaping status: Never Used  Substance Use Topics   Alcohol use: Not Currently    Alcohol/week: 1.0 standard drink of alcohol     Types: 1 Cans of beer per week    Comment: 03/06/2013 glass of wine maybe once a month; glass of beer/wk   Drug use: No    Mr.Watson reports that he quit smoking about 44 years ago. His smoking use included cigarettes. He started smoking about 57 years ago. He has a 13 pack-year smoking history. He has never used smokeless tobacco. He reports that he does not  currently use alcohol after a past usage of about 1.0 standard drink of alcohol per week. He reports that he does not use drugs.  Tobacco Cessation: Counseling given: Not Answered Former smoker quit 1981  Past surgical hx, Family hx, Social hx all reviewed.  Current Outpatient Medications on File Prior to Visit  Medication Sig   acetaminophen  (TYLENOL ) 325 MG tablet Take 650 mg by mouth every 6 (six) hours as needed for moderate pain (pain score 4-6).   apixaban  (ELIQUIS ) 5 MG TABS tablet Take 1 tablet (5 mg total) by mouth 2 (two) times daily.   b complex vitamins capsule Take 1 capsule by mouth daily.   cetirizine  (ZYRTEC ) 10 MG tablet Take 10 mg by mouth daily.   co-enzyme Q-10 30 MG capsule Take 50 mg by mouth daily.   diclofenac  sodium (VOLTAREN ) 1 % GEL Apply 4 g topically 4 (four) times daily as needed.   donepezil  (ARICEPT ) 10 MG tablet Take one tab at bedtime   memantine  (NAMENDA ) 5 MG tablet Take 1 tablet (5 mg at night) for 2 weeks, then increase to 1 tablet (5 mg) twice a day   No current facility-administered medications on file prior to visit.     Allergies  Allergen Reactions   Lisinopril  Swelling    Tongue swelling   Xarelto  [Rivaroxaban ] Swelling    Tongue swelling   Lipitor [Atorvastatin ] Other (See Comments)    Mental foggy    Review Of Systems:  Constitutional:   No  weight loss, night sweats,  Fevers, chills, fatigue, or  lassitude.  HEENT:   No headaches,  Difficulty swallowing,  Tooth/dental problems, or  Sore throat,                No sneezing, itching, ear ache, nasal congestion, post  nasal drip,   CV:  No chest pain,  Orthopnea, PND, swelling in lower extremities, anasarca, dizziness, palpitations, syncope.   GI  No heartburn, indigestion, abdominal pain, nausea, vomiting, diarrhea, change in bowel habits, loss of appetite, bloody stools.   Resp: No shortness of breath with exertion or at rest.  No excess mucus, no productive cough,  No non-productive cough,  No coughing up of blood.  No change in color of mucus.  No wheezing.  No chest wall deformity  Skin: no rash or lesions.  GU: no dysuria, change in color of urine, no urgency or frequency.  No flank pain, no hematuria   MS:  No joint pain or swelling.  No decreased range of motion.  No back pain.  Psych:  No change in mood or affect. No depression or anxiety.  No memory loss.   Vital Signs BP 111/68   Pulse 77   Temp 97.6 F (36.4 C) (Oral)   Ht 6' 5 (1.956 m)   Wt 232 lb 12.8 oz (105.6 kg)   SpO2 98%   BMI 27.61 kg/m    Physical Exam:  General- No distress,  A&Ox3, pleasant ENT: No sinus tenderness, TM clear, pale nasal mucosa, no oral exudate,no post nasal drip, no LAN Cardiac: S1, S2, regular rate and rhythm, no murmur Chest: No wheeze/ rales/ dullness; no accessory muscle use, no nasal flaring, no sternal retractions, slightly diminished per bases bilaterally Abd.: Soft Non-tender, ND, BS +, Body mass index is 27.61 kg/m.  Ext: No clubbing cyanosis, edema, no obvious deformities Neuro:  Physical deconditioning, MAE x 4, A&O x 3, memory impairment Skin: No rashes, warm and dry, no obvious skin lesions  Psych: normal mood and behavior  Assessment & Plan Right upper lobe pulmonary nodule, increasing in size Nodule increased from 7x5 mm in 2017 to 10x7 mm.  Requires follow up due to smoking and family history of lung cancer. - Order dedicated CT of the chest within two weeks at [facility] - Evaluate CT results and compare with previous scans from 2017 and May 2025. - Consider  PET scan if significant growth is observed.  Remote history of tobacco use Smoking history from 1968 to 1981, ceased nearly 40 years ago. Not significant but relevant for nodule monitoring. - Continue monitoring pulmonary nodules closely due to remote smoking history.   I spent 20 minutes dedicated to the care of this patient on the date of this encounter to include pre-visit review of records, face-to-face time with the patient discussing conditions above, post visit ordering of testing, clinical documentation with the electronic health record, making appropriate referrals as documented, and communicating necessary information to the patient's healthcare team.     Lauraine JULIANNA Lites, NP 07/09/2024  11:30 AM

## 2024-07-10 ENCOUNTER — Ambulatory Visit: Admitting: Family

## 2024-07-10 ENCOUNTER — Encounter: Payer: Self-pay | Admitting: Family

## 2024-07-10 ENCOUNTER — Ambulatory Visit (INDEPENDENT_AMBULATORY_CARE_PROVIDER_SITE_OTHER): Admitting: Dermatology

## 2024-07-10 ENCOUNTER — Other Ambulatory Visit: Payer: Self-pay

## 2024-07-10 ENCOUNTER — Encounter: Payer: Self-pay | Admitting: Podiatry

## 2024-07-10 VITALS — BP 118/74 | HR 64 | Temp 97.8°F | Resp 20 | Ht 77.0 in | Wt 233.0 lb

## 2024-07-10 VITALS — BP 114/96 | HR 121

## 2024-07-10 DIAGNOSIS — M1711 Unilateral primary osteoarthritis, right knee: Secondary | ICD-10-CM

## 2024-07-10 DIAGNOSIS — Z1283 Encounter for screening for malignant neoplasm of skin: Secondary | ICD-10-CM

## 2024-07-10 DIAGNOSIS — M533 Sacrococcygeal disorders, not elsewhere classified: Secondary | ICD-10-CM

## 2024-07-10 DIAGNOSIS — L578 Other skin changes due to chronic exposure to nonionizing radiation: Secondary | ICD-10-CM | POA: Diagnosis not present

## 2024-07-10 DIAGNOSIS — G8929 Other chronic pain: Secondary | ICD-10-CM

## 2024-07-10 DIAGNOSIS — L814 Other melanin hyperpigmentation: Secondary | ICD-10-CM | POA: Diagnosis not present

## 2024-07-10 DIAGNOSIS — L570 Actinic keratosis: Secondary | ICD-10-CM | POA: Diagnosis not present

## 2024-07-10 DIAGNOSIS — L821 Other seborrheic keratosis: Secondary | ICD-10-CM | POA: Diagnosis not present

## 2024-07-10 DIAGNOSIS — R2689 Other abnormalities of gait and mobility: Secondary | ICD-10-CM

## 2024-07-10 DIAGNOSIS — Z0000 Encounter for general adult medical examination without abnormal findings: Secondary | ICD-10-CM

## 2024-07-10 DIAGNOSIS — D229 Melanocytic nevi, unspecified: Secondary | ICD-10-CM

## 2024-07-10 DIAGNOSIS — W908XXA Exposure to other nonionizing radiation, initial encounter: Secondary | ICD-10-CM

## 2024-07-10 DIAGNOSIS — L820 Inflamed seborrheic keratosis: Secondary | ICD-10-CM

## 2024-07-10 DIAGNOSIS — M5416 Radiculopathy, lumbar region: Secondary | ICD-10-CM

## 2024-07-10 DIAGNOSIS — D1801 Hemangioma of skin and subcutaneous tissue: Secondary | ICD-10-CM

## 2024-07-10 NOTE — Patient Instructions (Signed)

## 2024-07-10 NOTE — Progress Notes (Signed)
 Subjective:   Dennis Hill is a 78 y.o. male who presents for Medicare Annual/Subsequent preventive examination.  Visit Complete: In person  Patient Medicare AWV questionnaire was completed by the patient on 07/10/2024; I have confirmed that all information answered by patient is correct and no changes since this date.  Cardiac Risk Factors include: advanced age (>9men, >30 women);male gender;smoking/ tobacco exposure     Objective:    Today's Vitals   07/10/24 1339 07/10/24 1353  BP: 118/74   Pulse: 64   Resp: 20   Temp: 97.8 F (36.6 C)   SpO2: 99%   Weight: 233 lb (105.7 kg)   Height: 6' 5 (1.956 m)   PainSc:  3    Body mass index is 27.63 kg/m.     07/10/2024    1:31 PM 05/01/2024   10:58 AM 02/05/2024    1:38 PM 12/07/2023    2:29 PM 03/10/2016    2:08 PM 03/06/2016    3:41 PM 02/23/2016   12:47 PM  Advanced Directives  Does Patient Have a Medical Advance Directive? Yes Yes Yes Yes Yes  Yes  Yes   Type of Estate agent of [place];Living will;Out of facility DNR (pink MOST or yellow form) Healthcare Power of [place];Living will Healthcare Power of [employer] of [place];Living will Healthcare Power of [place];Living will  Healthcare Power of [place];Living will  Healthcare Power of [place];Living will   Does patient want to make changes to medical advance directive? No - Patient declined No - Patient declined No - Patient declined No - Patient declined  No - Patient declined  No - Patient declined   Copy of Healthcare Power of Attorney in Chart? Yes - validated most recent copy scanned in chart (See row information) Yes - validated most recent copy scanned in chart (See row information) No - copy requested Yes - validated most recent copy scanned in chart (See row information) No - copy requested  Yes  No - copy requested      Data saved with a previous flowsheet row definition    Current Medications  (verified) Outpatient Encounter Medications as of 07/10/2024  Medication Sig   acetaminophen  (TYLENOL ) 325 MG tablet Take 650 mg by mouth every 6 (six) hours as needed for moderate pain (pain score 4-6).   apixaban  (ELIQUIS ) 5 MG TABS tablet Take 1 tablet (5 mg total) by mouth 2 (two) times daily.   b complex vitamins capsule Take 1 capsule by mouth daily.   cetirizine  (ZYRTEC ) 10 MG tablet Take 10 mg by mouth daily.   co-enzyme Q-10 30 MG capsule Take 50 mg by mouth daily.   diclofenac  sodium (VOLTAREN ) 1 % GEL Apply 4 g topically 4 (four) times daily as needed.   donepezil  (ARICEPT ) 10 MG tablet Take one tab at bedtime   memantine  (NAMENDA ) 5 MG tablet Take 1 tablet (5 mg at night) for 2 weeks, then increase to 1 tablet (5 mg) twice a day   No facility-administered encounter medications on file as of 07/10/2024.    Allergies (verified) Lisinopril , Xarelto  [rivaroxaban ], and Lipitor [atorvastatin ]   History: Past Medical History:  Diagnosis Date   Anxiety    Arthritis    Atrial bigeminy 09/18/2006   Dr. 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   Fatty liver    GERD 03/15/2009   no medications now   Headache(784.0)    monthly (03/06/2013)   Hx of adenomatous colonic polyps 03/15/2009   Hx of cardiovascular stress test    ETT-Myoview (9/15):  Inferior infarct, no ischemia, EF 41% - Intermediate Risk   Hyperlipidemia 02/10/2011   NEPHROLITHIASIS, HX OF 01/28/2010   NICM (nonischemic cardiomyopathy) (HCC)    Echo (10/15):  Mild LVH, EF 35-40%, diff HK   Pneumonia ~ 1950   Sleep apnea    Past Surgical History:  Procedure Laterality Date   APPENDECTOMY     ATRIAL FLUTTER  ABLATION  03/06/2013   ATRIAL FLUTTER ABLATION N/A 03/06/2013   Procedure: ATRIAL FLUTTER ABLATION;  Surgeon: Elspeth JAYSON Fernande, MD;  Location[facility] CATH LAB;  Service: Cardiovascular;  Laterality: N/A;   CARDIOVERSION N/A 02/06/2013   Procedure: CARDIOVERSION;  Surgeon: Danelle LELON Birmingham, MD;  Location: [facility] ENDOSCOPY;  Service: Cardiovascular;  Laterality: N/A;   CHOLECYSTECTOMY  09/18/2004   INGUINAL HERNIA REPAIR Right 09/18/1984   JOINT REPLACEMENT     LEFT HEART CATHETERIZATION WITH CORONARY ANGIOGRAM N/A 06/19/2014   Procedure: LEFT HEART CATHETERIZATION WITH CORONARY ANGIOGRAM;  Surgeon: Lonni JONETTA Cash, MD;  Location: [facility] CATH LAB;  Service: Cardiovascular;  Laterality: N/A;   TEE WITHOUT CARDIOVERSION N/A 03/05/2013   Procedure: TRANSESOPHAGEAL ECHOCARDIOGRAM (TEE);  Surgeon: Ezra GORMAN Shuck, MD;  Location: [facility] ENDOSCOPY;  Service: Cardiovascular;  Laterality: N/A;  pam rogue   TONSILLECTOMY AND ADENOIDECTOMY  ~ 1952   TOTAL KNEE ARTHROPLASTY Left 02/22/2016   Procedure: TOTAL KNEE ARTHROPLASTY;  Surgeon: Maude Herald, MD;  Location: [facility] OR;  Service: Orthopedics;  Laterality: Left;   Family History  Problem Relation Age of Onset   Lung cancer Mother 69   Colon cancer Mother    Cancer Mother    Cancer Maternal Grandmother    CAD Neg Hx    Heart attack Neg Hx    Stroke Neg Hx    Diabetes Neg Hx    Social History   Socioeconomic History   Marital status: Married    Spouse name: Not on file   Number of children: 2   Years of education: 16   Highest education level: Bachelor's degree (e.g., BA, AB, BS)  Occupational History   Occupation: Manufacturers Rep for [employer]  Tobacco Use   Smoking status: Former    Current packs/day: 0.00    Average packs/day: 1 pack/day for 13.0 years (13.0 ttl pk-yrs)    Types: Cigarettes    Start date: 12/25/1966    Quit date: 12/25/1979    Years since quitting: 44.5   Smokeless tobacco: Never  Vaping Use   Vaping status: Never Used   Substance and Sexual Activity   Alcohol use: Not Currently    Alcohol/week: 1.0 standard drink of alcohol    Types: 1 Cans of beer per week    Comment: 03/06/2013 glass of wine maybe once a month; glass of beer/wk   Drug use: No   Sexual activity: Not Currently  Other Topics Concern   Not on file  Social History Narrative   Step daughter and 2 31 yo twins live with    Right handed   Drink caffeine   [place] lower home   Social Drivers of Corporate investment banker  Strain: Low Risk  (07/08/2024)   Overall Financial Resource Strain (CARDIA)    Difficulty of Paying Living Expenses: Not hard at all  Food Insecurity: No Food Insecurity (07/10/2024)   Hunger Vital Sign    Worried About Running Out of Food in the Last Year: Never true    Ran Out of Food in the Last Year: Never true  Recent Concern: Food Insecurity - Food Insecurity Present (05/01/2024)   Hunger Vital Sign    Worried About Running Out of Food in the Last Year: Never true    Ran Out of Food in the Last Year: Sometimes true  Transportation Needs: No Transportation Needs (07/08/2024)   PRAPARE - Administrator, Civil Service (Medical): No    Lack of Transportation (Non-Medical): No  Physical Activity: Inactive (07/08/2024)   Exercise Vital Sign    Days of Exercise per Week: 0 days    Minutes of Exercise per Session: Not on file  Stress: No Stress Concern Present (07/08/2024)   [employer] of Occupational Health - Occupational Stress Questionnaire    Feeling of Stress: Only a little  Social Connections: Moderately Isolated (07/08/2024)   Social Connection and Isolation Panel    Frequency of Communication with Friends and Family: More than three times a week    Frequency of Social Gatherings with Friends and Family: Once a week    Attends Religious Services: Never    Database administrator or Organizations: No    Attends Engineer, structural: Not on file    Marital Status: Married     Tobacco Counseling Counseling given: Not Answered   Clinical Intake:  Pre-visit preparation completed: No  Pain : 0-10 Pain Score: 3  Pain Type: Chronic pain Pain Location: Knee (right shoulder and lower back) Pain Orientation: Right Pain Radiating Towards: No Pain Descriptors / Indicators: Aching Pain Onset: More than a month ago Pain Frequency: Intermittent Pain Relieving Factors: Tylenol  Effect of Pain on Daily Activities: walking  Pain Relieving Factors: Tylenol   BMI - recorded: 27.63 Nutritional Status: BMI 25 -29 Overweight Nutritional Risks: None Diabetes: No  How often do you need to have someone help you when you read instructions, pamphlets, or other written materials from your doctor or pharmacy?: 1 - Never (unless it's small print) What is the last grade level you completed in school?: College  Interpreter Needed?: No      Activities of Daily Living    07/08/2024    6:06 PM  In your present state of health, do you have any difficulty performing the following activities:  Hearing? 0  Vision? 0  Difficulty concentrating or making decisions? 1  Walking or climbing stairs? 1  Dressing or bathing? 0  Doing errands, shopping? 1  Preparing Food and eating ? N  Using the Toilet? N  In the past six months, have you accidently leaked urine? Y  Do you have problems with loss of bowel control? N  Managing your Medications? Y  Managing your Finances? Y  Housekeeping or managing your Housekeeping? Y    Patient Care Team: Demarques Pilz C, NP as PCP - General (Family Medicine) [name], MD as PCP - Electrophysiology (Cardiology)  Indicate any recent Medical Services you may have received from other than [facility] providers in the past year (date may be approximate).     Assessment:   This is a routine wellness examination for Dennis Hill.  Hearing/Vision screen Hearing Screening - Comments:: No hearing issues. Vision Screening -  Comments:: Eye exam  April 2025 pt has cataract    Goals Addressed             This Visit's Progress    Weight (lb) < 200 lb (90.7 kg)       Loss weight 210 lbs        Depression Screen    07/10/2024    1:33 PM 05/01/2024   10:58 AM 02/06/2024   10:42 AM 12/07/2023    2:27 PM 03/07/2017    3:40 PM 01/15/2015    8:38 AM 01/09/2014    8:38 AM  PHQ 2/9 Scores  PHQ - 2 Score 0 0 0 0 0 0 0  PHQ- 9 Score    2       Fall Risk    07/10/2024    1:33 PM 07/08/2024    6:06 PM 05/01/2024   10:58 AM 02/05/2024    1:37 PM 12/07/2023    2:27 PM  Fall Risk   Falls in the past year? 1 1 0 0 1  Number falls in past yr: 0 0 0 0 0  Injury with Fall? 1 1 0 0 0  Risk for fall due to : No Fall Risks  No Fall Risks  History of fall(s)  Follow up Falls evaluation completed  Falls evaluation completed Falls evaluation completed Falls evaluation completed    MEDICARE RISK AT HOME: Medicare Risk at Home Any stairs in or around the home?: (Patient-Rptd) Yes If so, are there any without handrails?: (Patient-Rptd) No Home free of loose throw rugs in walkways, pet beds, electrical cords, etc?: (Patient-Rptd) Yes Adequate lighting in your home to reduce risk of falls?: (Patient-Rptd) Yes Life alert?: (Patient-Rptd) No Use of a cane, walker or w/c?: (Patient-Rptd) No Grab bars in the bathroom?: (Patient-Rptd) Yes Shower chair or bench in shower?: (Patient-Rptd) Yes Elevated toilet seat or a handicapped toilet?: (Patient-Rptd) No  TIMED UP AND GO:  Was the test performed?  Yes  Length of time to ambulate 10 feet: 5 sec Gait slow and steady without use of assistive device    Cognitive Function:    02/05/2024    2:00 PM  MMSE - Mini Mental State Exam  Orientation to time 2  Orientation to Place 0  Registration 3  Attention/ Calculation 5  Recall 2  Language- name 2 objects 2  Language- repeat 1  Language- follow 3 step command 3  Language- read & follow direction 1  Write a sentence 1  Copy design 1   Total score 21        07/10/2024    1:33 PM  6CIT Screen  What Year? 0 points  What month? 0 points  What time? 0 points  Count back from 20 2 points  Months in reverse 2 points  Repeat phrase 8 points  Total Score 12 points    Immunizations Immunization History  Administered Date(s) Administered   INFLUENZA, HIGH DOSE SEASONAL PF 07/27/2017   Moderna Sars-Covid-2 Vaccination 11/10/2019, 12/08/2019   PPD Test 02/24/2016   Pneumococcal Conjugate-13 01/09/2014   Pneumococcal Polysaccharide-23 03/15/2009   Td 03/15/2009   Tdap 05/01/2024    TDAP status: Up to date  Flu Vaccine status: Declined, Education has been provided regarding the importance of this vaccine but patient still declined. Advised may receive this vaccine at local pharmacy or Health Dept. Aware to provide a copy of the vaccination record if obtained from local pharmacy or Health Dept. Verbalized acceptance and understanding.  Pneumococcal  vaccine status: Up to date  Covid-19 vaccine status: Information provided on how to obtain vaccines.   Qualifies for Shingles Vaccine? Yes   Zostavax completed No   Shingrix Completed?: No.    Education has been provided regarding the importance of this vaccine. Patient has been advised to call insurance company to determine out of pocket expense if they have not yet received this vaccine. Advised may also receive vaccine at local pharmacy or Health Dept. Verbalized acceptance and understanding.  Screening Tests Health Maintenance  Topic Date Due   OPHTHALMOLOGY EXAM  04/16/2015   HEMOGLOBIN A1C  06/14/2024   COVID-19 Vaccine (3 - Moderna risk series) 07/25/2024 (Originally 01/05/2020)   Zoster Vaccines- Shingrix (1 of 2) 08/01/2024 (Originally 02/26/1965)   Influenza Vaccine  12/16/2024 (Originally 04/18/2024)   Diabetic kidney evaluation - eGFR measurement  02/05/2025   Diabetic kidney evaluation - Urine ACR  02/05/2025   FOOT EXAM  04/10/2025   Medicare Annual  Wellness (AWV)  07/10/2025   DTaP/Tdap/Td (3 - Td or Tdap) 05/01/2034   Pneumococcal Vaccine: 50+ Years  Completed   Meningococcal B Vaccine  Aged Out   Colonoscopy  Discontinued   Hepatitis C Screening  Discontinued    Health Maintenance  Health Maintenance Due  Topic Date Due   OPHTHALMOLOGY EXAM  04/16/2015   HEMOGLOBIN A1C  06/14/2024    Colorectal cancer screening: No longer required.   Lung Cancer Screening: (Low Dose CT Chest recommended if Age 34-80 years, 20 pack-year currently smoking OR have quit w/in 15years.) does not qualify.   Lung Cancer Screening Referral: N/A   Additional Screening:  Hepatitis C Screening: does not qualify; Completed N/A   Vision Screening: Recommended annual ophthalmology exams for early detection of glaucoma and other disorders of the eye. Is the patient up to date with their annual eye exam?  Yes  Who is the provider or what is the name of the office in which the patient attends annual eye exams? Unable to recall opthalomologist in [place]  If pt is not established with a provider, would they like to be referred to a provider to establish care? No .   Dental Screening: Recommended annual dental exams for proper oral hygiene  Diabetic Foot Exam: Diabetic Foot Exam: Completed N/A   Community Resource Referral / Chronic Care Management: CRR required this visit?  No   CCM required this visit?  No     Plan:     I have personally reviewed and noted the following in the patient's chart:   Medical and social history Use of alcohol, tobacco or illicit drugs  Current medications and supplements including opioid prescriptions. Patient is not currently taking opioid prescriptions. Functional ability and status Nutritional status Physical activity Advanced directives List of other physicians Hospitalizations, surgeries, and ER visits in previous 12 months Vitals Screenings to include cognitive, depression, and falls Referrals  and appointments  In addition, I have reviewed and discussed with patient certain preventive protocols, quality metrics, and best practice recommendations. A written personalized care plan for preventive services as well as general preventive health recommendations were provided to patient.     Roxan JAYSON Plough, NP   07/10/2024   After Visit Summary: (In Person-Printed) AVS printed and given to the patient  Nurse Notes: Declined Flu shot

## 2024-07-10 NOTE — Progress Notes (Signed)
 Assessment/Plan:   Dementia likely due to Alzheimer's disease***  Dennis Hill is a very pleasant 78 y.o. RH male with a history of hypertension, hyperlipidemia, atrial fibrillation, arthritis, anxiety, CAD, prediabetes with possible neuropathy, history of dementia diagnosed while in [place] about 2 years ago before relocating to this area  seen today in follow up for memory loss. Patient is currently on donepezil  10 mg daily and memantine  5 mg twice daily, tolerating well.***.  Patient is able to participate on ADLs***and no longer drives.  Mood is***    Follow up in   months. Continue donepezil  10 mg daily, side effects discussed*** Increase memantine  to 10 mg twice daily as directed, side effects discussed Recommend good control of her cardiovascular risk factors Continue to control mood as per PCP     Subjective:    This patient is accompanied in the office by his daughter*** who supplements the history.  Previous records as well as any outside records available were reviewed prior to todays visit. Patient was last seen on 02/05/2024 with MMSE 21/30 (unable to do MoCA)***   Any changes in memory since last visit?  repeats oneself?  Endorsed Disoriented when walking into a room? Denies ***  Leaving objects?  May misplace things but not in unusual places***  Wandering behavior?  denies   Any personality changes since last visit?  Denies.   Any worsening depression?:  Denies.   Hallucinations or paranoia?  Denies.   Seizures? denies    Any sleep changes?  Denies vivid dreams, REM behavior or sleepwalking   Sleep apnea?   Denies.   Any hygiene concerns? Denies.  Independent of bathing and dressing?  Endorsed  Does the patient needs help with medications?   is in charge *** Who is in charge of the finances?   is in charge   *** Any changes in appetite?  denies ***   Patient have trouble swallowing? Denies.   Does the patient cook? No Any headaches?   denies   Any  vision changes?*** Chronic back pain  denies   Ambulates with difficulty? Denies.  *** Recent falls or head injuries? Denies.     Unilateral weakness, numbness or tingling? denies   Any tremors?  Denies  *** Any anosmia?  Denies   Any incontinence of urine?  Endorsed***  Any bowel dysfunction?   Denies      Patient lives   *** Does the patient drive? No longer drives ***  Initial visit 02/05/2024 How long did patient have memory difficulties?  For about 2.5 years. We waited till the following year to be seen at  [facility] (Dr. Aurea). Neuropsych evaluation was concerning for Alzheimer's disease with vascular component as well. Patient reports  difficulty remembering new information, recent conversations, names which it becomes frustrating at times. LTM is better than STM. Moved from [place] to [place] in March 2025.Daughter works for [employer] therefore they wanted to be seen within our system.  repeats oneself?  Endorsed, more frequently than before, especially over the last 1.5 month.  Disoriented when walking into a room?  He reports that he lives in [place] or in [place], or in [place]  (where he was born and grew up). He never says [place]-daughter says.    Leaving objects in unusual places? HE may misplaced stuff.   Wandering behavior? He may walk around but has not really gotten lost. Doors are locked Any personality changes, or depression, anxiety? A long time ago there was some depression, but there  is frustration, anxiety, anger related to the memory difficulties per daughter's report..  Hallucinations or paranoia? Before he moved, he thought that his wife was not his real wife, a different Merlynn. Seizures? Denies.    Any sleep changes?  Sleeps well . Denies frequent nightmares or dream reenactment, other REM behavior or sleepwalking   Sleep apnea? Denies.   Any hygiene concerns?  He needs reminder, he does it 2 times a week and he used to take it 2 times daily.   Independent of  bathing and dressing? Endorsed  Does the patient need help with medications? Daughter is in charge   Who is in charge of the finances? The other daughter is in charge     Any changes in appetite?  Sometimes he may forget to eat.      Patient have trouble swallowing?  Denies.   Does the patient cook? No  Any headaches?  Denies.   Chronic pain? Arthritis in  both knees   Ambulates with difficulty?  He walks about 1 mile a day. Recent falls or head injuries? A recent fall, unwitnessed, 3-4 weeks ago, fell on his knees, he could not remember whom to call to get up so he called his wife who is still in [place] who called his daughter in [place].   Vision changes?  Denies any new issues.  Has a history of cataracts Any strokelike symptoms? Denies.   Any tremors? Denies.   Any anosmia? Denies.   Any incontinence of urine?  Has urinary frequency , urge incontinence in the setting of BPH. Any bowel dysfunction?  Denies       Patient lives with his daughter in [place]. They are trying to keep him with them as much as they can.  History of heavy alcohol intake? Denies.   History of heavy tobacco use? Denies.   Family history of dementia? Denies  Does patient drive? 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   apixaban  (ELIQUIS ) 5 MG TABS tablet Take 1 tablet (5 mg total) by mouth 2 (two) times daily.   b complex vitamins capsule Take 1 capsule by mouth daily.   cetirizine  (ZYRTEC ) 10 MG tablet Take 10 mg  by mouth daily.   co-enzyme Q-10 30 MG capsule Take 50 mg by mouth daily.   diclofenac  sodium (VOLTAREN ) 1 % GEL Apply 4 g topically 4 (four) times daily as needed.   donepezil  (ARICEPT ) 10 MG tablet Take one tab at bedtime   memantine  (NAMENDA ) 5 MG tablet Take 1 tablet (5 mg at night) for 2 weeks, then increase to 1 tablet (5 mg) twice a day   No facility-administered encounter medications on file as of 07/11/2024.       02/05/2024    2:00 PM  MMSE - Mini Mental State Exam  Orientation to time 2  Orientation to Place 0  Registration 3  Attention/ Calculation 5  Recall 2  Language- name 2 objects 2  Language- repeat 1  Language- follow 3 step command 3  Language- read & follow direction  1  Write a sentence 1  Copy design 1  Total score 21       No data to display          Objective:     PHYSICAL EXAMINATION:    VITALS:  There were no vitals filed for this visit.  GEN:  The patient appears stated age and is in NAD. HEENT:  Normocephalic, atraumatic.   Neurological examination:  General: NAD, well-groomed, appears stated age. Orientation: The patient is alert. Oriented to person, not to place or time Cranial nerves: There is good facial symmetry.The speech is fluent and clear. No aphasia or dysarthria. Fund of knowledge is reduced. Recent and remote memory are impaired. Attention and concentration are reduced. Able to name objects and repeat phrases.  Memantine  hearing is intact to conversational tone. *** Sensation: Sensation is intact to light touch throughout Motor: Strength is at least antigravity x4. DTR's 2/4 in UE/LE     Movement examination: Tone: There is normal tone in the UE/LE Abnormal movements:  no tremor.  No myoclonus.  No asterixis.   Coordination:  There is no decremation with RAM's. Normal finger to nose  Gait and Station: The patient has no*** difficulty arising out of a deep-seated chair without the use of the hands. The patient's stride  length is good.  Gait is cautious and narrow.    Thank you for allowing us  the opportunity to participate in the care of this nice patient. Please do not hesitate to contact us  for any questions or concerns.   Total time spent on today's visit was *** minutes dedicated to this patient today, preparing to see patient, examining the patient, ordering tests and/or medications and counseling the patient, documenting clinical information in the EHR or other health record, independently interpreting results and communicating results to the patient/family, discussing treatment and goals, answering patient's questions and coordinating care.  Cc:  [name], Roxan BROCKS, NP  Camie Sevin 07/10/2024 5:39 AM

## 2024-07-10 NOTE — Progress Notes (Unsigned)
 Follow-Up Visit   Subjective  Dennis Hill is a 78 y.o. male who presents for the following: Skin Cancer Screening and Full Body Skin Exam  The patient presents for Total-Body Skin Exam (TBSE) for skin cancer screening and mole check. The patient has spots, moles and lesions to be evaluated, some may be new or changing.  Patient is concerned about a spot under the left and right eye. There is also a lesion on the right cheek. Denies pain to any of them.  He states that his neck itches where his shirt sits. He has tried applying lotion but only temporarily provides relief.    The following portions of the chart were reviewed this encounter and updated as appropriate: medications, allergies, medical history  Review of Systems:  No other skin or systemic complaints except as noted in HPI or Assessment and Plan.  Objective  Well appearing patient in no apparent distress; mood and affect are within normal limits.  A full examination was performed including scalp, head, eyes, ears, nose, lips, neck, chest, axillae, abdomen, back, buttocks, bilateral upper extremities, bilateral lower extremities, hands, feet, fingers, toes, fingernails, and toenails. All findings within normal limits unless otherwise noted below.   Relevant physical exam findings are noted in the Assessment and Plan.  Mid Frontal Scalp, Mid Tip of Nose Erythematous thin papules/macules with gritty scale.   Assessment & Plan   SKIN CANCER SCREENING PERFORMED TODAY.  ACTINIC DAMAGE - Chronic condition, secondary to cumulative UV/sun exposure - diffuse scaly erythematous macules with underlying dyspigmentation - Recommend daily broad spectrum sunscreen SPF 30+ to sun-exposed areas, reapply every 2 hours as needed.  - Staying in the shade or wearing long sleeves, sun glasses (UVA+UVB protection) and wide brim hats (4-inch brim around the entire circumference of the hat) are also recommended for sun protection.  -  Call for new or changing lesions.  LENTIGINES, SEBORRHEIC KERATOSES, HEMANGIOMAS - Benign normal skin lesions - Benign-appearing - Call for any changes  MELANOCYTIC NEVI - Tan-brown and/or pink-flesh-colored symmetric macules and papules - Benign appearing on exam today - Observation - Call clinic for new or changing moles - Recommend daily use of broad spectrum spf 30+ sunscreen to sun-exposed areas.   SEBORRHEIC KERATOSIS - Stuck-on, waxy, tan-brown papules and/or plaques  - Benign-appearing - Discussed benign etiology and prognosis. - Observe - Call for any changes  AK (ACTINIC KERATOSIS) (2) Mid Frontal Scalp, Mid Tip of Nose Destruction of lesion - Mid Frontal Scalp, Mid Tip of Nose Complexity: simple   Destruction method: cryotherapy   Informed consent: discussed and consent obtained   Timeout:  patient name, date of birth, surgical site, and procedure verified Lesion destroyed using liquid nitrogen: Yes   Region frozen until ice ball extended beyond lesion: Yes   Outcome: patient tolerated procedure well with no complications   Post-procedure details: wound care instructions given    Destruction of lesion - Mid Frontal Scalp, Mid Tip of Nose Complexity: simple   Destruction method: cryotherapy   Timeout:  patient name, date of birth, surgical site, and procedure verified Outcome: patient tolerated procedure well with no complications   Post-procedure details: wound care instructions given    Destruction of lesion - Mid Frontal Scalp, Mid Tip of Nose Complexity: simple   Destruction method: cryotherapy   Informed consent: discussed and consent obtained   Outcome: patient tolerated procedure well with no complications   Post-procedure details: wound care instructions given    INFLAMED SEBORRHEIC KERATOSIS  Left Malar Cheek Destruction of lesion - Left Malar Cheek Complexity: simple   Destruction method: cryotherapy   Informed consent: discussed and consent  obtained   Timeout:  patient name, date of birth, surgical site, and procedure verified Lesion destroyed using liquid nitrogen: Yes   Region frozen until ice ball extended beyond lesion: Yes   Outcome: patient tolerated procedure well with no complications   Post-procedure details: wound care instructions given    ACTINIC SKIN DAMAGE   LENTIGINES   SEBORRHEIC KERATOSIS   CHERRY ANGIOMA   MULTIPLE BENIGN NEVI   Return in about 6 months (around 01/08/2025) for TBSE follow up.  LILLETTE [name], RN, am acting as scribe for RUFUS CHRISTELLA HOLY, MD .   Documentation: I have reviewed the above documentation for accuracy and completeness, and I agree with the above.  RUFUS CHRISTELLA HOLY, MD

## 2024-07-10 NOTE — Patient Instructions (Signed)
 [name] , Thank you for taking time to come for your Medicare Wellness Visit. I appreciate your ongoing commitment to your health goals. Please review the following plan we discussed and let me know if I can assist you in the future.   Screening recommendations/referrals: Colonoscopy : N/a  Recommended yearly ophthalmology/optometry visit for glaucoma screening and checkup Recommended yearly dental visit for hygiene and checkup  Vaccinations: Influenza vaccine due annually in September/October Pneumococcal vaccine : Up to date  Tdap vaccine : Up to date  Shingles vaccine : Due Please get vaccine at the pharmacy     Advanced directives: Yes   Conditions/risks identified: Cardiac Risk Factors include: advanced age (>30men, >5 women);male gender;smoking/ tobacco exposure  Next appointment: 1 year   Preventive Care 80 Years and Older, Male Preventive care refers to lifestyle choices and visits with your health care provider that can promote health and wellness. What does preventive care include? A yearly physical exam. This is also called an annual well check. Dental exams once or twice a year. Routine eye exams. Ask your health care provider how often you should have your eyes checked. Personal lifestyle choices, including: Daily care of your teeth and gums. Regular physical activity. Eating a healthy diet. Avoiding tobacco and drug use. Limiting alcohol use. Practicing safe sex. Taking low doses of aspirin  every day. Taking vitamin and mineral supplements as recommended by your health care provider. What happens during an annual well check? The services and screenings done by your health care provider during your annual well check will depend on your age, overall health, lifestyle risk factors, and family history of disease. Counseling  Your health care provider may ask you questions about your: Alcohol use. Tobacco use. Drug use. Emotional well-being. Home and  relationship well-being. Sexual activity. Eating habits. History of falls. Memory and ability to understand (cognition). Work and work Astronomer. Screening  You may have the following tests or measurements: Height, weight, and BMI. Blood pressure. Lipid and cholesterol levels. These may be checked every 5 years, or more frequently if you are over 80 years old. Skin check. Lung cancer screening. You may have this screening every year starting at age 80 if you have a 30-pack-year history of smoking and currently smoke or have quit within the past 15 years. Fecal occult blood test (FOBT) of the stool. You may have this test every year starting at age 80. Flexible sigmoidoscopy or colonoscopy. You may have a sigmoidoscopy every 5 years or a colonoscopy every 10 years starting at age 80. Prostate cancer screening. Recommendations will vary depending on your family history and other risks. Hepatitis C blood test. Hepatitis B blood test. Sexually transmitted disease (STD) testing. Diabetes screening. This is done by checking your blood sugar (glucose) after you have not eaten for a while (fasting). You may have this done every 1-3 years. Abdominal aortic aneurysm (AAA) screening. You may need this if you are a current or former smoker. Osteoporosis. You may be screened starting at age 80 if you are at high risk. Talk with your health care provider about your test results, treatment options, and if necessary, the need for more tests. Vaccines  Your health care provider may recommend certain vaccines, such as: Influenza vaccine. This is recommended every year. Tetanus, diphtheria, and acellular pertussis (Tdap, Td) vaccine. You may need a Td booster every 10 years. Zoster vaccine. You may need this after age 28. Pneumococcal 13-valent conjugate (PCV13) vaccine. One dose is recommended after age 47.  Pneumococcal polysaccharide (PPSV23) vaccine. One dose is recommended after age 14. Talk to your  health care provider about which screenings and vaccines you need and how often you need them. This information is not intended to replace advice given to you by your health care provider. Make sure you discuss any questions you have with your health care provider. Document Released: 10/01/2015 Document Revised: 05/24/2016 Document Reviewed: 07/06/2015 Elsevier Interactive Patient Education  2017 [employer].  Fall Prevention in the Home Falls can cause injuries. They can happen to people of all ages. There are many things you can do to make your home safe and to help prevent falls. What can I do on the outside of my home? Regularly fix the edges of walkways and driveways and fix any cracks. Remove anything that might make you trip as you walk through a door, such as a raised step or threshold. Trim any bushes or trees on the path to your home. Use bright outdoor lighting. Clear any walking paths of anything that might make someone trip, such as rocks or tools. Regularly check to see if handrails are loose or broken. Make sure that both sides of any steps have handrails. Any raised decks and porches should have guardrails on the edges. Have any leaves, snow, or ice cleared regularly. Use sand or salt on walking paths during winter. Clean up any spills in your garage right away. This includes oil or grease spills. What can I do in the bathroom? Use night lights. Install grab bars by the toilet and in the tub and shower. Do not use towel bars as grab bars. Use non-skid mats or decals in the tub or shower. If you need to sit down in the shower, use a plastic, non-slip stool. Keep the floor dry. Clean up any water that spills on the floor as soon as it happens. Remove soap buildup in the tub or shower regularly. Attach bath mats securely with double-sided non-slip rug tape. Do not have throw rugs and other things on the floor that can make you trip. What can I do in the bedroom? Use night  lights. Make sure that you have a light by your bed that is easy to reach. Do not use any sheets or blankets that are too big for your bed. They should not hang down onto the floor. Have a firm chair that has side arms. You can use this for support while you get dressed. Do not have throw rugs and other things on the floor that can make you trip. What can I do in the kitchen? Clean up any spills right away. Avoid walking on wet floors. Keep items that you use a lot in easy-to-reach places. If you need to reach something above you, use a strong step stool that has a grab bar. Keep electrical cords out of the way. Do not use floor polish or wax that makes floors slippery. If you must use wax, use non-skid floor wax. Do not have throw rugs and other things on the floor that can make you trip. What can I do with my stairs? Do not leave any items on the stairs. Make sure that there are handrails on both sides of the stairs and use them. Fix handrails that are broken or loose. Make sure that handrails are as long as the stairways. Check any carpeting to make sure that it is firmly attached to the stairs. Fix any carpet that is loose or worn. Avoid having throw rugs at the  top or bottom of the stairs. If you do have throw rugs, attach them to the floor with carpet tape. Make sure that you have a light switch at the top of the stairs and the bottom of the stairs. If you do not have them, ask someone to add them for you. What else can I do to help prevent falls? Wear shoes that: Do not have high heels. Have rubber bottoms. Are comfortable and fit you well. Are closed at the toe. Do not wear sandals. If you use a stepladder: Make sure that it is fully opened. Do not climb a closed stepladder. Make sure that both sides of the stepladder are locked into place. Ask someone to hold it for you, if possible. Clearly mark and make sure that you can see: Any grab bars or handrails. First and last  steps. Where the edge of each step is. Use tools that help you move around (mobility aids) if they are needed. These include: Canes. Walkers. Scooters. Crutches. Turn on the lights when you go into a dark area. Replace any light bulbs as soon as they burn out. Set up your furniture so you have a clear path. Avoid moving your furniture around. If any of your floors are uneven, fix them. If there are any pets around you, be aware of where they are. Review your medicines with your doctor. Some medicines can make you feel dizzy. This can increase your chance of falling. Ask your doctor what other things that you can do to help prevent falls. This information is not intended to replace advice given to you by your health care provider. Make sure you discuss any questions you have with your health care provider. Document Released: 07/01/2009 Document Revised: 02/10/2016 Document Reviewed: 10/09/2014 Elsevier Interactive Patient Education  2017 [employer].

## 2024-07-11 ENCOUNTER — Encounter: Payer: Self-pay | Admitting: Physician Assistant

## 2024-07-11 ENCOUNTER — Ambulatory Visit: Admitting: Podiatry

## 2024-07-11 ENCOUNTER — Ambulatory Visit: Admitting: Physician Assistant

## 2024-07-11 ENCOUNTER — Encounter: Payer: Self-pay | Admitting: Dermatology

## 2024-07-11 VITALS — BP 107/67 | HR 69 | Resp 18 | Wt 232.0 lb

## 2024-07-11 DIAGNOSIS — G309 Alzheimer's disease, unspecified: Secondary | ICD-10-CM | POA: Diagnosis not present

## 2024-07-11 DIAGNOSIS — F0280 Dementia in other diseases classified elsewhere without behavioral disturbance: Secondary | ICD-10-CM

## 2024-07-11 MED ORDER — MEMANTINE HCL 10 MG PO TABS: 10.0000 mg | ORAL_TABLET | Freq: Two times a day (BID) | ORAL | 3 refills | Status: AC

## 2024-07-11 NOTE — Patient Instructions (Addendum)
 It was a pleasure to see you today at our office.   Recommendations:       Follow up in 6 months Continue donepezil  10 mg daily  Increase memantine  to 10 mg twice a day  Recommend visiting the website :  Dementia Success Path to better understand some behaviors related to memory loss.      https://www.barrowneuro.org/resource/neuro-rehabilitation-apps-and-games/   RECOMMENDATIONS FOR ALL PATIENTS WITH MEMORY PROBLEMS: 1. Continue to exercise (Recommend 30 minutes of walking everyday, or 3 hours every week) 2. Increase social interactions - continue going to [place] and enjoy social gatherings with friends and family 3. Eat healthy, avoid fried foods and eat more fruits and vegetables 4. Maintain adequate blood pressure, blood sugar, and blood cholesterol level. Reducing the risk of stroke and cardiovascular disease also helps promoting better memory. 5. Avoid stressful situations. Live a simple life and avoid aggravations. Organize your time and prepare for the next day in anticipation. 6. Sleep well, avoid any interruptions of sleep and avoid any distractions in the bedroom that may interfere with adequate sleep quality 7. Avoid sugar, avoid sweets as there is a strong link between excessive sugar intake, diabetes, and cognitive impairment We discussed the Mediterranean diet, which has been shown to help patients reduce the risk of progressive memory disorders and reduces cardiovascular risk. This includes eating fish, eat fruits and green leafy vegetables, nuts like almonds and hazelnuts, walnuts, and also use olive oil. Avoid fast foods and fried foods as much as possible. Avoid sweets and sugar as sugar use has been linked to worsening of memory function.  There is always a concern of gradual progression of memory problems. If this is the case, then we may need to adjust level of care according to patient needs. Support, both to the patient and caregiver, should then be put into place.         DRIVING: Regarding driving, in patients with progressive memory problems, driving will be impaired. We advise to have someone else do the driving if trouble finding directions or if minor accidents are reported. Independent driving assessment is available to determine safety of driving.   If you are interested in the driving assessment, you can contact the following:  [employer] in [place] [phone]  [facility] [phone]  [facility] [phone]  [facility] [phone] or [phone]   FALL PRECAUTIONS: Be cautious when walking. Scan the area for obstacles that may increase the risk of trips and falls. When getting up in the mornings, sit up at the edge of the bed for a few minutes before getting out of bed. Consider elevating the bed at the head end to avoid drop of blood pressure when getting up. Walk always in a well-lit room (use night lights in the walls). Avoid area rugs or power cords from appliances in the middle of the walkways. Use a walker or a cane if necessary and consider physical therapy for balance exercise. Get your eyesight checked regularly.  FINANCIAL OVERSIGHT: Supervision, especially oversight when making financial decisions or transactions is also recommended.  HOME SAFETY: Consider the safety of the kitchen when operating appliances like stoves, microwave oven, and blender. Consider having supervision and share cooking responsibilities until no longer able to participate in those. Accidents with firearms and other hazards in the house should be identified and addressed as well.   ABILITY TO BE LEFT ALONE: If patient is unable to contact 911 operator, consider using LifeLine, or when the need is  there, arrange for someone to stay with patients. Smoking is a fire hazard, consider supervision or cessation. Risk of wandering should be assessed by caregiver and if detected at any point, supervision and  safe proof recommendations should be instituted.  MEDICATION SUPERVISION: Inability to self-administer medication needs to be constantly addressed. Implement a mechanism to ensure safe administration of the medications.      Mediterranean Diet A Mediterranean diet refers to food and lifestyle choices that are based on the traditions of countries located on the [employer]. This way of eating has been shown to help prevent certain conditions and improve outcomes for people who have chronic diseases, like kidney disease and heart disease. What are tips for following this plan? Lifestyle  Cook and eat meals together with your family, when possible. Drink enough fluid to keep your urine clear or pale yellow. Be physically active every day. This includes: Aerobic exercise like running or swimming. Leisure activities like gardening, walking, or housework. Get 7-8 hours of sleep each night. If recommended by your health care provider, drink red wine in moderation. This means 1 glass a day for nonpregnant women and 2 glasses a day for men. A glass of wine equals 5 oz (150 mL). Reading food labels  Check the serving size of packaged foods. For foods such as rice and pasta, the serving size refers to the amount of cooked product, not dry. Check the total fat in packaged foods. Avoid foods that have saturated fat or trans fats. Check the ingredients list for added sugars, such as corn syrup. Shopping  At the grocery store, buy most of your food from the areas near the walls of the store. This includes: Fresh fruits and vegetables (produce). Grains, beans, nuts, and seeds. Some of these may be available in unpackaged forms or large amounts (in bulk). Fresh seafood. Poultry and eggs. Low-fat dairy products. Buy whole ingredients instead of prepackaged foods. Buy fresh fruits and vegetables in-season from local farmers markets. Buy frozen fruits and vegetables in resealable bags. If you do  not have access to quality fresh seafood, buy precooked frozen shrimp or canned fish, such as tuna, salmon, or sardines. Buy small amounts of raw or cooked vegetables, salads, or olives from the deli or salad bar at your store. Stock your pantry so you always have certain foods on hand, such as olive oil, canned tuna, canned tomatoes, rice, pasta, and beans. Cooking  Cook foods with extra-virgin olive oil instead of using butter or other vegetable oils. Have meat as a side dish, and have vegetables or grains as your main dish. This means having meat in small portions or adding small amounts of meat to foods like pasta or stew. Use beans or vegetables instead of meat in common dishes like chili or lasagna. Experiment with different cooking methods. Try roasting or broiling vegetables instead of steaming or sauteing them. Add frozen vegetables to soups, stews, pasta, or rice. Add nuts or seeds for added healthy fat at each meal. You can add these to yogurt, salads, or vegetable dishes. Marinate fish or vegetables using olive oil, lemon juice, garlic, and fresh herbs. Meal planning  Plan to eat 1 vegetarian meal one day each week. Try to work up to 2 vegetarian meals, if possible. Eat seafood 2 or more times a week. Have healthy snacks readily available, such as: Vegetable sticks with hummus. Greek yogurt. Fruit and nut trail mix. Eat balanced meals throughout the week. This includes: Fruit: 2-3 servings a day  Vegetables: 4-5 servings a day Low-fat dairy: 2 servings a day Fish, poultry, or lean meat: 1 serving a day Beans and legumes: 2 or more servings a week Nuts and seeds: 1-2 servings a day Whole grains: 6-8 servings a day Extra-virgin olive oil: 3-4 servings a day Limit red meat and sweets to only a few servings a month What are my food choices? Mediterranean diet Recommended Grains: Whole-grain pasta. Brown rice. Bulgar wheat. Polenta. Couscous. Whole-wheat bread. [place] Made[place]. Vegetables: Artichokes. Beets. Broccoli. Cabbage. Carrots. Eggplant. Green beans. Chard. Kale. Spinach. Onions. Leeks. Peas. Squash. Tomatoes. Peppers. Radishes. Fruits: Apples. Apricots. Avocado. Berries. Bananas. Cherries. Dates. Figs. Grapes. Lemons. Melon. Oranges. Peaches. Plums. Pomegranate. Meats and other protein foods: Beans. Almonds. Sunflower seeds. Pine nuts. Peanuts. Cod. Salmon. Scallops. Shrimp. Tuna. Tilapia. Clams. Oysters. Eggs. Dairy: Low-fat milk. Cheese. Greek yogurt. Beverages: Water. Red wine. Herbal tea. Fats and oils: Extra virgin olive oil. Avocado oil. Grape seed oil. Sweets and desserts: [place] yogurt with honey. Baked apples. Poached pears. Trail mix. Seasoning and other foods: Basil. Cilantro. Coriander. Cumin. Mint. Parsley. Sage. Rosemary. Tarragon. Garlic. Oregano. Thyme. Pepper. Balsalmic vinegar. Tahini. Hummus. Tomato sauce. Olives. Mushrooms. Limit these Grains: Prepackaged pasta or rice dishes. Prepackaged cereal with added sugar. Vegetables: Deep fried potatoes (french fries). Fruits: Fruit canned in syrup. Meats and other protein foods: Beef. Pork. Lamb. Poultry with skin. Hot dogs. [place]. Dairy: Ice cream. Sour cream. Whole milk. Beverages: Juice. Sugar-sweetened soft drinks. Beer. Liquor and spirits. Fats and oils: Butter. Canola oil. Vegetable oil. Beef fat (tallow). Lard. Sweets and desserts: Cookies. Cakes. Pies. Candy. Seasoning and other foods: Mayonnaise. Premade sauces and marinades. The items listed may not be a complete list. Talk with your dietitian about what dietary choices are right for you. Summary The Mediterranean diet includes both food and lifestyle choices. Eat a variety of fresh fruits and vegetables, beans, nuts, seeds, and whole grains. Limit the amount of red meat and sweets that you eat. Talk with your health care provider about whether it is safe for you to drink red wine in moderation. This means 1 glass a day for  nonpregnant women and 2 glasses a day for men. A glass of wine equals 5 oz (150 mL). This information is not intended to replace advice given to you by your health care provider. Make sure you discuss any questions you have with your health care provider. Document Released: 04/27/2016 Document Revised: 05/30/2016 Document Reviewed: 04/27/2016 Elsevier Interactive Patient Education  2017 [employer].

## 2024-07-17 ENCOUNTER — Other Ambulatory Visit

## 2024-07-19 ENCOUNTER — Telehealth: Admitting: Emergency Medicine

## 2024-07-19 DIAGNOSIS — R300 Dysuria: Secondary | ICD-10-CM

## 2024-07-19 NOTE — Progress Notes (Signed)
  I am very limited in what I can treat through Evisit. I am not able to treat men with possibly urinary tract infections. Because of your age and sex, you will need urine testing and your condition warrants further evaluation and I recommend that you be seen in a face-to-face visit. I am sorry I cannot help you by evisit.    NOTE: There will be NO CHARGE for this E-Visit   If you are having a true medical emergency, please call 911.     For an urgent face to face visit, [facility] has multiple urgent care centers for your convenience.  Click the link below for the full list of locations and hours, walk-in wait times, appointment scheduling options and driving directions:  Urgent Care - [place], [place], [place], [place], [place], [place]  [place]     Your MyChart E-visit questionnaire answers were reviewed by a board certified advanced clinical practitioner to complete your personal care plan based on your specific symptoms.    Thank you for using e-Visits.

## 2024-07-21 ENCOUNTER — Encounter: Payer: Self-pay | Admitting: Radiology

## 2024-07-23 ENCOUNTER — Ambulatory Visit: Admitting: Acute Care

## 2024-07-29 ENCOUNTER — Other Ambulatory Visit: Payer: Self-pay | Admitting: Cardiology

## 2024-07-29 ENCOUNTER — Other Ambulatory Visit

## 2024-07-29 NOTE — Telephone Encounter (Signed)
 Prescription refill request for Eliquis  received. Indication:aflutter Last office visit:4/25 Scr:1.13  5/25 Age: 78 Weight:105.2  kg  Prescription refilled

## 2024-08-04 NOTE — Discharge Summary (Signed)
 [facility] HEALTH [facility] Inpatient [facility] Discharge Summary    Discharge Details  PATIENT NAME: Dennis Hill  MRN: [id number]  DOB: 1946-03-19 ADMIT Date: 07/29/2024 10:06 AM   D/C Date: 08/11/2024 Hospital LOS in days: 14     Discharge Physician: Dennis LOISE Leghorn, MD Outpatient PCP: Dennis JAYSON Plough, FNP  Discharge Diagnoses    Symptomatic orthostatic hypotension  Hospital Course by Problem List    Dennis Hill is a 78 y.o. male with PMH of Afib complaint on anticoagulation, BPH, CAD, HLD, Alzheimer dementia, initially presented to [facility] with complaints of AMS, foul smelling urine and left sided headache. He lives with his daughter who noticed he was having a hard time staying awake. He was placed in observation given IVF and started on Rocephin. MRI brain obtained which showed several predominantly lobar chronic microhemorrhages, which may reflect amyloid angiopathy, family aware of this from prior imaging. During his time in the ED observation unit, he had multiple episodes of LOC. Admitted to hospitalist service for syncope work up.   #Acute cystitis without hematuria, POA Patient complained of foul smelling urine, daughter w/ c/f AMS. Does have history of BPH. Completed 5 days of Rocephin. Blood and urine cultures remained negative.   # Unresponsive periods/Syncope # symptomatic orthostatic hypotension Patient presented to the ED for evaluation of an episode of unresponsiveness at home. Workup in the ED initially was unrevealing apart from urinary tract infection. He was evaluated with brain MRI that did not show any acute findings. 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 It was later found out that he has been severely orthostatic. He has since been started on midodrine and Florinef. He refused compression stockings. Symptoms has improved  some with no further episodes of syncope on admission. However, given his age, it is possible that this may be due to underlying autonomic dysfunction in which case, it may be untreatable and patient will need learn how to cope with it.  When his HEP from the sitting to the standing position, he may need to stand for about 10 to 20 seconds to make sure that he is not dizzy before he starts to walk.  Patient otherwise doing better and will be discharged to SNF today.  #PAF He is not on any rate control medications but hear rate has been well controlled on admission.  On Eliquis  for stroke prevention.   #Acute flare of gout, second left toe Patient complained of pain, swelling and erythema of the second left toe.  Treated empirically with a course of colchicine for acute flare of gouty arthritis with improvement in symptoms.   #Infected left foot with abscess Patient noted to have callus of the right big toe that was erythematous and fluctuant concerning for abscess.  Underwent I and D by podiatry on 11/23 Wound cultures with no growth to date Treated empirically with a course of Unasyn and podiatry recommended 10 days course of Augmentin on discharge Continue daily wound care at SNF.     #Dementia due to Alzheimer's Disease Follows [facility].  - Namenda  10mg  po BID restarted - decrease home Aricept  to 5 mg BID    The remainder of the patient's medical problems were chronic and stable without any further intervention this admission. The patient will continue the current treatments and medications.  Recommendations to Outpatient Physicians/Follow-Up Needed    Significant Studies  Labs on  Discharge:  Recent Labs    Units 08/11/24 0526 08/07/24 0535 08/06/24 0521  WBC thou/mcL 12.6* 11.1* 12.9*  HGB gm/dL 87.3* 86.8* 87.2*  HCT % 39.6* 41.4 40.8  PLT thou/mcL 380 307 304   Recent Labs    Units 08/11/24 0526 08/07/24 0535 08/06/24 0521  NA mmol/L 143 140 141  K mmol/L  4.0 4.2 3.7  CL mmol/L 105 104 103  CO2 mmol/L 26 23 26   BUN mg/dL 18 18 23   CREATININE mg/dL 9.00 8.92 8.89  CALCIUM  mg/dL 9.7 9.8 9.6   Recent Labs    Units 08/07/24 0535  URICACID mg/dL 6.4   No results for input(s): TSH, HGBA1C in the last 168 hours.  No results for input(s): LABPROT, INR, PTT in the last 168 hours. No results for input(s): CHOL, LDL, HDL, TRIG in the last 168 hours. No results for input(s): TROPONIN, CK in the last 168 hours.  Invalid input(s): CK-MB   Diagnostic findings:    Discharge Physical Examination  Vitals:  Vitals:   08/11/24 0705  BP: 135/71  Pulse: 70  Resp: 18  Temp: 97.7 F (36.5 C)  SpO2: 96%    Wt Readings from Last 1 Encounters:  08/04/24 98.1 kg (216 lb 3.2 oz)    Physical Exam:  Constitutional - resting comfortably, no acute distress Eyes - pupils equal round and reactive to light Mouth - no oral lesions noted Throat - no swelling or erythema Neck - supple, no JVD   CV - (+)S1S2, no murmurs Resp - Good AE bilaterally, no wheezing or crackles  GI - (+)BS, soft, non-tender, non-distended Extrem - no clubbing, cyanosis, or peripheral edema, left foot dressed with bandage Neuro - alert, aware, oriented to person/place/time  Psych - normal affect, no anxiety      Post-Hospital Care  Discharge destination: SAR/SNF  Potential for rehab: Good  Discharge medications:   Current Discharge Medication List     START taking these medications      Details  amoxicillin-clavulanate 875-125 mg per tablet Commonly known as: AUGMENTIN  Take one tablet by mouth 2 (two) times daily for 10 days.   fludrocortisone 0.1 MG tablet Commonly known as: FLORINEF Start taking on: August 12, 2024  Take one tablet (0.1 mg dose) by mouth daily.   midodrine HCl 5 mg tablet Commonly known as: PROAMATINE  Take one tablet (5 mg dose) by mouth 3 (three) times a day for 30 days.       CONTINUE these  medications which have CHANGED      Details  donepezil  HCl 5 mg tablet Commonly known as: ARICEPT  Start taking on: August 12, 2024 What changed:  medication strength how much to take  Take one tablet (5 mg dose) by mouth every morning.       CONTINUE these medications which have NOT CHANGED      Details  acetaminophen  325 mg tablet Commonly known as: TYLENOL   Take two tablets (650 mg dose) by mouth every 6 (six) hours as needed for Pain.   apixaban  5 mg tablet Commonly known as: ELIQUIS   Take one tablet (5 mg dose) by mouth 2 (two) times daily. Indication: Atrial Fibrillation, Disease of the Peripheral Arteries Quantity: 60 tablet   b complex vitamins capsule  Take one capsule by mouth every evening.   cetirizine  10 mg tablet Commonly known as: ZYRTEC   Take one tablet (10 mg dose) by mouth every morning.   CO-ENZYME Q-10 PO  Take 100 mg by  mouth every evening.   fluticasone  propionate 50 mcg/actuation nasal spray Commonly known as: FLONASE   one spray by Both Nostrils route daily at 4 (four) pm.   memantine  HCl 10 mg tablet Commonly known as: NAMENDA   Take one tablet (10 mg dose) by mouth 2 (two) times daily.      * You might also be taking other medications not listed above. If you have questions about any of your other medications, talk to the person who prescribed them or your Primary Care Provider.           Diet: Cardiac diet Regular Diet  Wound care orders:  Wound Orders (From admission, onward)     Start       08/13/24 0900  WOUND CARE     Comments: Paint incision with betadine. Cover with xeroform, 4x4s, ABD. Cover with Kerlix and ACE wrap lightly.  Order Comments: Paint incision with betadine. Cover with xeroform, 4x4s, ABD. Cover with Kerlix and ACE wrap lightly.     Question Answer Comment  Wound location Foot   Dressing type 4x4   Dressing type Ace wrap 4"   Dressing type 4" Kerlix rolls   Dressing type Xeroform 1x8   Reinforce  dressing PRN   Reinforce dressing Reinforce with gauze and ABD as needed                  Follow-up Appointment Date and Time: No future appointments.  Consults:   Code Status: No CPR  Patient education: The patient was educated on warning signs regarding the current medical conditions. If any of these issues were to arise or worsen, the patient was instructed to contact the PCP or seek further medical evaluation in the emergency room.  Time spent with discharge coordination care: 60 minutes   Dennis LOISE Leghorn, MD [facility] Inpatient Care Specialist  08/11/2024 *Some images could not be shown.

## 2024-08-08 ENCOUNTER — Ambulatory Visit: Admitting: Acute Care

## 2024-08-12 ENCOUNTER — Encounter: Payer: Self-pay | Admitting: Nurse Practitioner

## 2024-08-12 ENCOUNTER — Non-Acute Institutional Stay (SKILLED_NURSING_FACILITY): Payer: Self-pay | Admitting: Nurse Practitioner

## 2024-08-12 DIAGNOSIS — I951 Orthostatic hypotension: Secondary | ICD-10-CM | POA: Diagnosis not present

## 2024-08-12 DIAGNOSIS — I5022 Chronic systolic (congestive) heart failure: Secondary | ICD-10-CM

## 2024-08-12 DIAGNOSIS — L02612 Cutaneous abscess of left foot: Secondary | ICD-10-CM | POA: Insufficient documentation

## 2024-08-12 DIAGNOSIS — N400 Enlarged prostate without lower urinary tract symptoms: Secondary | ICD-10-CM

## 2024-08-12 DIAGNOSIS — E785 Hyperlipidemia, unspecified: Secondary | ICD-10-CM

## 2024-08-12 DIAGNOSIS — I482 Chronic atrial fibrillation, unspecified: Secondary | ICD-10-CM

## 2024-08-12 DIAGNOSIS — F0390 Unspecified dementia without behavioral disturbance: Secondary | ICD-10-CM | POA: Insufficient documentation

## 2024-08-12 DIAGNOSIS — M109 Gout, unspecified: Secondary | ICD-10-CM

## 2024-08-12 NOTE — Assessment & Plan Note (Signed)
 Not on statin

## 2024-08-12 NOTE — Assessment & Plan Note (Signed)
 Euvolemic.

## 2024-08-12 NOTE — Assessment & Plan Note (Signed)
 MRI brain amyloid angiopathy 08/07/2024, followed by [facility] neurologist, restarted Namenda  10 mg twice daily, decrease Aricept  to 5 mg p.o. twice daily.

## 2024-08-12 NOTE — Telephone Encounter (Signed)
 Please advise,patient had cta done,can this be used as a follow up on the nodules  1.  Mural filling defect in the segmental right apical pulmonary artery with distal contrast opacification of the subsegmental apical pulmonary arterial branches consistent with nonocclusive or chronic pulmonary embolus. No findings of acute pulmonary emboli.  2.  8mm right apical pulmonary nodule, recommendations as below.  3.  Left superior pole nonobstructive renal calculus measures 1.7 cm.

## 2024-08-12 NOTE — Assessment & Plan Note (Signed)
 Heart rate is in control, on Eliquis , no rate control medications

## 2024-08-12 NOTE — Progress Notes (Signed)
 Location:  [facility] Room Number: N024-A Place of Service:  SNF (31) Provider: Omario Ander X, NP    Patient Care Team: [name], Roxan BROCKS, NP as PCP - General (Family Medicine) [name], MD as PCP - Electrophysiology (Cardiology)  Extended Emergency Contact Information Primary Emergency Contact: [name] Address: [address]          [place], [place] [address] [place] Mobile Phone: [phone] Relation: Daughter Secondary Emergency Contact: Arlyss Perkins Address: [address]          [place], [place] [address] [place] Phone: [phone] Relation: Daughter  Code Status:  DNR Goals of care: Advanced Directive information    08/12/2024    2:24 PM  Advanced Directives  Does Patient Have a Medical Advance Directive? Yes  Type of Estate Agent of [place];Living will;Out of facility DNR (pink MOST or yellow form)  Does patient want to make changes to medical advance directive? No - Patient declined  Copy of Healthcare Power of Attorney in Chart? Yes - validated most recent copy scanned in chart (See row information)  Pre-existing out of facility DNR order (yellow form or pink MOST form) Yellow form placed in chart (order not valid for inpatient use)     Chief Complaint  Patient presents with   Medication Management    medication review     HPI:  Pt is a 78 y.o. male seen today for an acute visit for medication review following hospital stay  Hospitalized 07/29/2024 to 08/11/2024 for symptomatic Orthostatic hypotension/syncope, AMS, UTI-completed 5 days of Rocephin, infected left foot with abscess-callus of the right big toe-status post I&D 08/10/2022, sutures intact, erythema in area, 10 days course of Augmentin upon discharge  Orthostatic hypotension, on midodrine, Florinef, compression stockings, Bun/creat 18/0.99 08/11/24  A-fib, on Eliquis , no rate control  medications  CHF, euvolemic.   Gout, left second toe, resolved acute flareup, treated with colchicine, uric acid 6.5 08/07/24  BPH, no urinary retention  CAD, stable.   HLD, not taking statin  Dementia, MRI brain amyloid angiopathy 08/07/2024, followed by [facility] neurologist, restarted Namenda  10 mg twice daily, decrease Aricept  to 5 mg p.o. twice daily.   Past Medical History:  Diagnosis Date   Anxiety    Arthritis    Atrial bigeminy 09/18/2006   Dr. 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   Fatty liver    GERD 03/15/2009   no medications now   Headache(784.0)    monthly (03/06/2013)   Hx of adenomatous colonic polyps 03/15/2009   Hx of cardiovascular stress test    ETT-Myoview (9/15):  Inferior infarct, no ischemia, EF 41% - Intermediate Risk   Hyperlipidemia 02/10/2011   NEPHROLITHIASIS, HX OF 01/28/2010   NICM (nonischemic cardiomyopathy) (HCC)    Echo (10/15):  Mild LVH, EF 35-40%, diff HK   Pneumonia ~ 1950   Sleep apnea    Past Surgical History:  Procedure Laterality Date   APPENDECTOMY     ATRIAL FLUTTER ABLATION  03/06/2013   ATRIAL FLUTTER ABLATION N/A 03/06/2013   Procedure: ATRIAL FLUTTER ABLATION;  Surgeon: Elspeth BROCKS  Fernande, MD;  Location: [facility] CATH LAB;  Service: Cardiovascular;  Laterality: N/A;   CARDIOVERSION N/A 02/06/2013   Procedure: CARDIOVERSION;  Surgeon: Danelle LELON Birmingham, MD;  Location: [facility] ENDOSCOPY;  Service: Cardiovascular;  Laterality: N/A;   CHOLECYSTECTOMY  09/18/2004   INGUINAL HERNIA REPAIR Right 09/18/1984   JOINT REPLACEMENT     LEFT HEART CATHETERIZATION WITH CORONARY ANGIOGRAM N/A 06/19/2014   Procedure: LEFT HEART CATHETERIZATION WITH  CORONARY ANGIOGRAM;  Surgeon: Lonni JONETTA Cash, MD;  Location: [facility] CATH LAB;  Service: Cardiovascular;  Laterality: N/A;   TEE WITHOUT CARDIOVERSION N/A 03/05/2013   Procedure: TRANSESOPHAGEAL ECHOCARDIOGRAM (TEE);  Surgeon: Ezra GORMAN Shuck, MD;  Location: [facility] ENDOSCOPY;  Service: Cardiovascular;  Laterality: N/A;  pam rogue   TONSILLECTOMY AND ADENOIDECTOMY  ~ 1952   TOTAL KNEE ARTHROPLASTY Left 02/22/2016   Procedure: TOTAL KNEE ARTHROPLASTY;  Surgeon: Maude Herald, MD;  Location: [facility] OR;  Service: Orthopedics;  Laterality: Left;    Allergies  Allergen Reactions   Lisinopril  Swelling    Tongue swelling   Xarelto  [Rivaroxaban ] Swelling    Tongue swelling   Lipitor [Atorvastatin ] Other (See Comments)    Mental foggy    Outpatient Encounter Medications as of 08/12/2024  Medication Sig   acetaminophen  (TYLENOL ) 325 MG tablet Take 650 mg by mouth every 6 (six) hours as needed for moderate pain (pain score 4-6).   amoxicillin-clavulanate (AUGMENTIN) 875-125 MG tablet Take 1 tablet by mouth 2 (two) times daily.   b complex vitamins capsule Take 1 capsule by mouth daily.   cetirizine  (ZYRTEC ) 10 MG tablet Take 10 mg by mouth daily.   co-enzyme Q-10 30 MG capsule Take 50 mg by mouth daily.   diclofenac  sodium (VOLTAREN ) 1 % GEL Apply 4 g topically 4 (four) times daily as needed.   donepezil  (ARICEPT ) 5 MG tablet Take 5 mg by mouth daily in the afternoon.   ELIQUIS  5 MG TABS tablet TAKE 1 TABLET(5 MG) BY MOUTH TWICE DAILY   fludrocortisone (FLORINEF) 0.1 MG tablet Take 0.1 mg by mouth daily.   fluticasone  (FLONASE ) 50 MCG/ACT nasal spray Place 1 spray into both nostrils daily.   memantine  (NAMENDA ) 10 MG tablet Take 1 tablet (10 mg total) by mouth 2 (two) times daily. Take 1 tablet  twice a day   midodrine (PROAMATINE) 5 MG tablet Take 5 mg by mouth 3 (three) times daily.   [DISCONTINUED] donepezil  (ARICEPT ) 10 MG tablet Take one tab at bedtime   No facility-administered encounter  medications on file as of 08/12/2024.    Review of Systems  Constitutional:  Negative for appetite change, fatigue and fever.  HENT:  Negative for congestion and trouble swallowing.   Eyes:  Negative for visual disturbance.  Respiratory:  Negative for cough, shortness of breath and wheezing.   Cardiovascular:  Negative for chest pain, palpitations and leg swelling.  Gastrointestinal:  Negative for abdominal pain and constipation.  Genitourinary:  Negative for difficulty urinating, dysuria and urgency.       Incontinent of urine  Musculoskeletal:  Positive for arthralgias, gait problem and joint swelling.  Skin:  Positive for wound.  Neurological:  Negative for syncope, weakness and headaches.       Dementia  Psychiatric/Behavioral:  Negative for behavioral problems and sleep disturbance. The patient is not nervous/anxious.     Immunization History  Administered Date(s) Administered   INFLUENZA, HIGH DOSE SEASONAL PF 07/27/2017   Moderna Sars-Covid-2 Vaccination 11/10/2019, 12/08/2019   PPD Test 02/24/2016   Pneumococcal Conjugate-13 01/09/2014  Pneumococcal Polysaccharide-23 03/15/2009   Td 03/15/2009   Tdap 05/01/2024   Pertinent  Health Maintenance Due  Topic Date Due   OPHTHALMOLOGY EXAM  04/16/2015   HEMOGLOBIN A1C  06/14/2024   Influenza Vaccine  12/16/2024 (Originally 04/18/2024)   FOOT EXAM  04/10/2025   Colonoscopy  Discontinued      02/05/2024    1:37 PM 05/01/2024   10:58 AM 07/08/2024    6:06 PM 07/10/2024    1:33 PM 07/11/2024    8:33 AM  Fall Risk  Falls in the past year? 0 0 1 1 1   Was there an injury with Fall? 0 0 1 1 0  Fall Risk Category Calculator 0 0 2  2 2   Patient at Risk for Falls Due to  No Fall Risks  No Fall Risks   Fall risk Follow up Falls evaluation completed Falls evaluation completed  Falls evaluation completed Falls evaluation completed     Patient-reported   Functional Status Survey:    Vitals:   08/12/24 1412  BP: 118/66   Pulse: 72  Resp: 18  Temp: (!) 97.1 F (36.2 C)  SpO2: 97%  Weight: 232 lb (105.2 kg)  Height: 6' 5 (1.956 m)   Body mass index is 27.51 kg/m. Physical Exam Vitals and nursing note reviewed.  Constitutional:      Appearance: Normal appearance.  HENT:     Head: Normocephalic and atraumatic.     Nose: Nose normal.     Mouth/Throat:     Mouth: Mucous membranes are moist.  Eyes:     Extraocular Movements: Extraocular movements intact.     Conjunctiva/sclera: Conjunctivae normal.     Pupils: Pupils are equal, round, and reactive to light.  Cardiovascular:     Rate and Rhythm: Normal rate and regular rhythm.     Heart sounds: No murmur heard. Pulmonary:     Effort: Pulmonary effort is normal.     Breath sounds: No wheezing, rhonchi or rales.  Abdominal:     General: Bowel sounds are normal.     Palpations: Abdomen is soft.     Tenderness: There is no abdominal tenderness.  Musculoskeletal:        General: Tenderness present. Normal range of motion.     Cervical back: Normal range of motion and neck supple.     Right lower leg: No edema.     Left lower leg: No edema.     Comments: The left  great toe and MTP joint plain palpated  Skin:    General: Skin is warm and dry.     Findings: Erythema present.     Comments: Medial left big toe to 1st MTP intact sutures with the area erythema and mild swelling, tender to touch  Neurological:     General: No focal deficit present.     Mental Status: He is alert and oriented to person, place, and time. Mental status is at baseline.     Motor: No weakness.     Coordination: Coordination normal.     Gait: Gait abnormal.  Psychiatric:        Mood and Affect: Mood normal.        Behavior: Behavior normal.        Thought Content: Thought content normal.        Judgment: Judgment normal.     Labs reviewed: Recent Labs    12/13/23 0824 02/06/24 1119  NA 144 141  K 5.7* 5.1  CL 108 106  CO2 30 30  GLUCOSE 116* 106  BUN 24 20   CREATININE 1.23 1.13  CALCIUM  10.2 9.9   Recent Labs    12/13/23 0824 02/06/24 1119  AST 14 19  ALT 14 26  BILITOT 0.5 0.6  PROT 6.5 6.4   Recent Labs    12/13/23 0824 02/06/24 1119  WBC 7.4 8.3  NEUTROABS 4,329 5,212  HGB 15.4 13.6  HCT 46.1 42.0  MCV 87.6 88.6  PLT 232 333   Lab Results  Component Value Date   TSH 2.46 12/13/2023   Lab Results  Component Value Date   HGBA1C 6.4 (H) 12/13/2023   Lab Results  Component Value Date   CHOL 142 12/13/2023   HDL 39 (L) 12/13/2023   LDLCALC 84 12/13/2023   LDLDIRECT 87.2 09/05/2013   TRIG 94 12/13/2023   CHOLHDL 3.6 12/13/2023    Significant Diagnostic Results in last 30 days:  No results found.  Assessment/Plan Chronic atrial fibrillation (HCC) Heart rate is in control, on Eliquis , no rate control medications  Chronic systolic CHF (congestive heart failure) (HCC) Euvolemic  Orthostatic hypotension on midodrine, Florinef, compression stockings, Bun/creat 18/0.99 08/11/24  Foot abscess, left  infected left foot with abscess-callus of the right big toe-status post I&D 08/10/2022, sutures intact, erythema in area, 10 days course of Augmentin upon discharge  Gout left second toe, resolved acute flareup, treated with colchicine, uric acid 6.5 08/07/24  Benign prostatic hyperplasia  no urinary retention  Hyperlipidemia Not on statin  Major neurocognitive disorder (HCC) MRI brain amyloid angiopathy 08/07/2024, followed by [facility] neurologist, restarted Namenda  10 mg twice daily, decrease Aricept  to 5 mg p.o. twice daily.     Family/ staff Communication: Plan of care reviewed with the patient and charge nurse  Labs/tests ordered: CBC/differential, CMP/eGFR, TSH, uric acid

## 2024-08-12 NOTE — Assessment & Plan Note (Signed)
 infected left foot with abscess-callus of the right big toe-status post I&D 08/10/2022, sutures intact, erythema in area, 10 days course of Augmentin upon discharge

## 2024-08-12 NOTE — Assessment & Plan Note (Signed)
 on midodrine, Florinef, compression stockings, Bun/creat 18/0.99 08/11/24

## 2024-08-12 NOTE — Assessment & Plan Note (Signed)
 no urinary retention

## 2024-08-12 NOTE — Assessment & Plan Note (Signed)
 left second toe, resolved acute flareup, treated with colchicine, uric acid 6.5 08/07/24

## 2024-08-13 ENCOUNTER — Encounter: Payer: Self-pay | Admitting: Family Medicine

## 2024-08-13 ENCOUNTER — Non-Acute Institutional Stay (SKILLED_NURSING_FACILITY): Payer: Self-pay | Admitting: Family Medicine

## 2024-08-13 DIAGNOSIS — I482 Chronic atrial fibrillation, unspecified: Secondary | ICD-10-CM

## 2024-08-13 DIAGNOSIS — F0390 Unspecified dementia without behavioral disturbance: Secondary | ICD-10-CM | POA: Diagnosis not present

## 2024-08-13 DIAGNOSIS — I951 Orthostatic hypotension: Secondary | ICD-10-CM | POA: Diagnosis not present

## 2024-08-13 NOTE — Assessment & Plan Note (Signed)
 Since patient was started on midodrine, blood pressures have been stable and he has had no further episodes of syncope

## 2024-08-13 NOTE — Telephone Encounter (Signed)
 Pt wishes to R/S

## 2024-08-13 NOTE — Assessment & Plan Note (Signed)
 He is anticoagulated but he takes no medicines for rate control and he is asymptomatic

## 2024-08-13 NOTE — Assessment & Plan Note (Signed)
 Followed at as outpatient at [facility] medications include Aricept  and Namenda 

## 2024-08-13 NOTE — Addendum Note (Signed)
 Addended by: CLEOTILDE GARNETTE HERO on: 08/13/2024 04:12 PM   Modules accepted: Level of Service

## 2024-08-13 NOTE — Progress Notes (Signed)
 Provider:  Garnette Pinal, MD Location:      Place of Service:     PCP: Leonarda Roxan BROCKS, NP Patient Care Team: [name], Roxan BROCKS, NP as PCP - General (Family Medicine) [name], MD as PCP - Electrophysiology (Cardiology)  Extended Emergency Contact Information Primary Emergency Contact: Dennis Hill Address: [address]          [place], [place] [address] [place] Mobile Phone: [phone] Relation: Daughter Secondary Emergency Contact: Dennis Hill Address: [address]          [place], [place] [address] [place] Phone: [phone] Relation: Daughter  Code Status:  Goals of Care: Advanced Directive information    08/12/2024    2:24 PM  Advanced Directives  Does Patient Have a Medical Advance Directive? Yes  Type of Estate Agent of [place];Living will;Out of facility DNR (pink MOST or yellow form)  Does patient want to make changes to medical advance directive? No - Patient declined  Copy of Healthcare Power of Attorney in Chart? Yes - validated most recent copy scanned in chart (See row information)  Pre-existing out of facility DNR order (yellow form or pink MOST form) Yellow form placed in chart (order not valid for inpatient use)       HPI: Patient is a 78 y.o. male seen today for admission to [facility] skilled nursing facility. This patient was admitted to the hospital on 08/11/2024 and discharged on 08/13/2024.  Primary discharge diagnosis was symptomatic orthostatic hypotension.  Patient has a history of a fib on anticoagulation, BPH, CAD, HLD, Alzheimer's dementia.  He presented to the emergency room with chief complaints of altered mental status, foul-smelling urine and left-sided headache.  His daughter noted that he was having a hard time staying awake.  He was placed in observation and given IV fluids and started on Rocephin.  During time in observation he was noted to  have multiple occasions of loss of consciousness and he was admitted for workup of syncope.  It was decided that he had severe orthostatic hypotension causing the loss of consciousness and he was started on midodrine and Florinef.  He refused compression stockings.  Symptoms improved with the addition of these 2 medicines. In addition he was found to have infected left foot with abscess and underwent I&D by podiatry on 1123.  He was treated empirically with Unasyn and Augmentin Past Medical History:  Diagnosis Date   Anxiety    Arthritis    Atrial bigeminy 09/18/2006   Dr. 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   Fatty liver    GERD 03/15/2009   no medications now   Headache(784.0)    monthly (03/06/2013)   Hx of adenomatous colonic polyps 03/15/2009   Hx of cardiovascular stress test    ETT-Myoview (9/15):  Inferior infarct, no ischemia, EF 41% - Intermediate Risk   Hyperlipidemia 02/10/2011   NEPHROLITHIASIS, HX OF 01/28/2010   NICM (nonischemic cardiomyopathy) (HCC)    Echo (10/15):  Mild LVH, EF 35-40%, diff HK   Pneumonia ~ 1950   Sleep apnea    Past Surgical History:  Procedure Laterality Date   APPENDECTOMY     ATRIAL FLUTTER ABLATION  03/06/2013   ATRIAL FLUTTER ABLATION N/A 03/06/2013   Procedure: ATRIAL FLUTTER ABLATION;  Surgeon: Elspeth JAYSON Sage, MD;  Location: [facility] CATH LAB;  Service: Cardiovascular;  Laterality: N/A;   CARDIOVERSION N/A 02/06/2013   Procedure: CARDIOVERSION;  Surgeon: Danelle LELON Birmingham, MD;  Location: [facility] ENDOSCOPY;  Service: Cardiovascular;  Laterality: N/A;   CHOLECYSTECTOMY  09/18/2004   INGUINAL HERNIA REPAIR Right 09/18/1984   JOINT REPLACEMENT      LEFT HEART CATHETERIZATION WITH CORONARY ANGIOGRAM N/A 06/19/2014   Procedure: LEFT HEART CATHETERIZATION WITH CORONARY ANGIOGRAM;  Surgeon: Lonni JONETTA Cash, MD;  Location: [facility] CATH LAB;  Service: Cardiovascular;  Laterality: N/A;   TEE WITHOUT CARDIOVERSION N/A 03/05/2013   Procedure: TRANSESOPHAGEAL ECHOCARDIOGRAM (TEE);  Surgeon: Ezra GORMAN Shuck, MD;  Location: [facility] ENDOSCOPY;  Service: Cardiovascular;  Laterality: N/A;  pam rogue   TONSILLECTOMY AND ADENOIDECTOMY  ~ 1952   TOTAL KNEE ARTHROPLASTY Left 02/22/2016   Procedure: TOTAL KNEE ARTHROPLASTY;  Surgeon: Maude Herald, MD;  Location: [facility] OR;  Service: Orthopedics;  Laterality: Left;    reports that he quit smoking about 44 years ago. His smoking use included cigarettes. He started smoking about 57 years ago. He has a 13 pack-year smoking history. He has never used smokeless tobacco. He reports that he does not currently use alcohol after a past usage of about 1.0 standard drink of alcohol per week. He reports that he does not use drugs. Social History   Socioeconomic History   Marital status: Married    Spouse name: Not on file   Number of children: 2   Years of education: 16   Highest education level: Bachelor's degree (e.g., BA, AB, BS)  Occupational History   Occupation: Manufacturers Rep for [employer]  Tobacco Use   Smoking status: Former    Current packs/day: 0.00    Average packs/day: 1 pack/day for 13.0 years (13.0 ttl pk-yrs)    Types: Cigarettes    Start date: 12/25/1966    Quit date: 12/25/1979    Years since quitting: 44.6   Smokeless tobacco: Never  Vaping Use   Vaping status: Never Used  Substance and Sexual Activity   Alcohol use: Not Currently    Alcohol/week: 1.0 standard drink of alcohol    Types: 1 Cans of beer per week    Comment: 03/06/2013 glass of wine maybe once a month; glass of beer/wk   Drug use: No   Sexual activity: Not Currently  Other Topics Concern   Not on file  Social History  Narrative   Step daughter and 2 76 yo twins live with    Right handed   Drink caffeine   [place] lower home   Social Drivers of Health   Financial Resource Strain: Low Risk  (07/08/2024)   Overall Financial Resource Strain (CARDIA)    Difficulty of Paying Living Expenses: Not hard at all  Food Insecurity: No Food Insecurity (07/30/2024)   Received from [facility]   Hunger Vital Sign    Within the past 12 months, you worried that your food would run out before you got the money to buy more.: Never true    Within the past 12 months, the food you bought just didn't last and you didn't have money to get more.: Never true  Recent Concern: Food Insecurity - Food Insecurity Present (05/01/2024)   Hunger Vital Sign    Worried About Running Out of Food  in the Last Year: Never true    Ran Out of Food in the Last Year: Sometimes true  Transportation Needs: No Transportation Needs (08/01/2024)   Received from [facility] - Transportation    In the past 12 months, has lack of transportation kept you from medical appointments or from getting medications?: No    In the past 12 months, has lack of transportation kept you from meetings, work, or from getting things needed for daily living?: No  Physical Activity: Inactive (07/08/2024)   Exercise Vital Sign    Days of Exercise per Week: 0 days    Minutes of Exercise per Session: Not on file  Stress: No Stress Concern Present (07/30/2024)   Received from [facility] - Occupational Stress Questionnaire    Do you feel stress - tense, restless, nervous, or anxious, or unable to sleep at night because your mind is troubled all the time - these days?: Not at all  Social Connections: Moderately Isolated (07/08/2024)   Social Connection and Isolation Panel    Frequency of Communication with Friends and Family: More than three times a week    Frequency of Social Gatherings with Friends and Family:  Once a week    Attends Religious Services: Never    Database Administrator or Organizations: No    Attends Engineer, Structural: Not on file    Marital Status: Married  Catering Manager Violence: Not At Risk (07/29/2024)   Received from [facility]   HITS    Over the last 12 months how often did your partner physically hurt you?: Never    Over the last 12 months how often did your partner insult you or talk down to you?: Never    Over the last 12 months how often did your partner threaten you with physical harm?: Never    Over the last 12 months how often did your partner scream or curse at you?: Never    Functional Status Survey:    Family History  Problem Relation Age of Onset   Lung cancer Mother 69   Colon cancer Mother    Cancer Mother    Cancer Maternal Grandmother    CAD Neg Hx    Heart attack Neg Hx    Stroke Neg Hx    Diabetes Neg Hx     Health Maintenance  Topic Date Due   Zoster Vaccines- Shingrix (1 of 2) Never done   OPHTHALMOLOGY EXAM  04/16/2015   COVID-19 Vaccine (3 - Moderna risk series) 01/05/2020   HEMOGLOBIN A1C  06/14/2024   Influenza Vaccine  12/16/2024 (Originally 04/18/2024)   Diabetic kidney evaluation - eGFR measurement  02/05/2025   Diabetic kidney evaluation - Urine ACR  02/05/2025   FOOT EXAM  04/10/2025   Medicare Annual Wellness (AWV)  07/10/2025   DTaP/Tdap/Td (3 - Td or Tdap) 05/01/2034   Pneumococcal Vaccine: 50+ Years  Completed   Meningococcal B Vaccine  Aged Out   Colonoscopy  Discontinued   Hepatitis C Screening  Discontinued    Allergies  Allergen Reactions   Lisinopril  Swelling    Tongue swelling   Xarelto  [Rivaroxaban ] Swelling    Tongue swelling   Lipitor [Atorvastatin ] Other (See Comments)    Mental foggy    Outpatient Encounter Medications as of 08/13/2024  Medication Sig   acetaminophen  (TYLENOL ) 325 MG tablet Take 650 mg by mouth every 6 (six) hours as needed for moderate pain (  pain score 4-6).    amoxicillin-clavulanate (AUGMENTIN) 875-125 MG tablet Take 1 tablet by mouth 2 (two) times daily.   b complex vitamins capsule Take 1 capsule by mouth daily.   cetirizine  (ZYRTEC ) 10 MG tablet Take 10 mg by mouth daily.   co-enzyme Q-10 30 MG capsule Take 50 mg by mouth daily.   diclofenac  sodium (VOLTAREN ) 1 % GEL Apply 4 g topically 4 (four) times daily as needed.   donepezil  (ARICEPT ) 5 MG tablet Take 5 mg by mouth daily in the afternoon.   ELIQUIS  5 MG TABS tablet TAKE 1 TABLET(5 MG) BY MOUTH TWICE DAILY   fludrocortisone (FLORINEF) 0.1 MG tablet Take 0.1 mg by mouth daily.   fluticasone  (FLONASE ) 50 MCG/ACT nasal spray Place 1 spray into both nostrils daily.   memantine  (NAMENDA ) 10 MG tablet Take 1 tablet (10 mg total) by mouth 2 (two) times daily. Take 1 tablet  twice a day   midodrine (PROAMATINE) 5 MG tablet Take 5 mg by mouth 3 (three) times daily.   No facility-administered encounter medications on file as of 08/13/2024.    Review of Systems  Constitutional:  Positive for activity change.  HENT: Negative.    Eyes: Negative.   Respiratory: Negative.    Cardiovascular: Negative.   Gastrointestinal: Negative.   Genitourinary:  Positive for difficulty urinating and frequency.  Neurological:  Positive for syncope.  Hematological: Negative.   Psychiatric/Behavioral:  Positive for confusion. The patient is nervous/anxious.   All other systems reviewed and are negative.   Vitals:   08/13/24 1413  BP: 103/87  Pulse: 80  Resp: 17  Temp: (!) 96.9 F (36.1 C)  TempSrc: Oral  Weight: 218 lb (98.9 kg)  Height: 6' 5 (1.956 m)   Body mass index is 25.85 kg/m. Physical Exam Vitals and nursing note reviewed.  Constitutional:      Appearance: Normal appearance.  HENT:     Head: Normocephalic.  Cardiovascular:     Rate and Rhythm: Normal rate and regular rhythm.  Pulmonary:     Effort: Pulmonary effort is normal.     Breath sounds: Normal breath sounds.  Abdominal:      General: Bowel sounds are normal.     Palpations: Abdomen is soft.  Musculoskeletal:        General: Normal range of motion.     Cervical back: Normal range of motion.     Comments: Large dressing on left foot.  This was not removed for evaluation  Skin:    General: Skin is warm and dry.  Neurological:     General: No focal deficit present.     Mental Status: He is alert and oriented to person, place, and time.  Psychiatric:        Mood and Affect: Mood normal.        Behavior: Behavior normal.     Labs reviewed: Basic Metabolic Panel: Recent Labs    12/13/23 0824 02/06/24 1119  NA 144 141  K 5.7* 5.1  CL 108 106  CO2 30 30  GLUCOSE 116* 106  BUN 24 20  CREATININE 1.23 1.13  CALCIUM  10.2 9.9   Liver Function Tests: Recent Labs    12/13/23 0824 02/06/24 1119  AST 14 19  ALT 14 26  BILITOT 0.5 0.6  PROT 6.5 6.4   No results for input(s): LIPASE, AMYLASE in the last 8760 hours. No results for input(s): AMMONIA in the last 8760 hours. CBC: Recent Labs    12/13/23 [id number]  02/06/24 1119  WBC 7.4 8.3  NEUTROABS 4,329 5,212  HGB 15.4 13.6  HCT 46.1 42.0  MCV 87.6 88.6  PLT 232 333   Cardiac Enzymes: No results for input(s): CKTOTAL, CKMB, CKMBINDEX, TROPONINI in the last 8760 hours. BNP: Invalid input(s): POCBNP Lab Results  Component Value Date   HGBA1C 6.4 (H) 12/13/2023   Lab Results  Component Value Date   TSH 2.46 12/13/2023   Lab Results  Component Value Date   VITAMINB12 625 12/13/2023   No results found for: FOLATE No results found for: IRON, TIBC, FERRITIN  Imaging and Procedures obtained prior to SNF admission: ECHOCARDIOGRAM COMPLETE Result Date: 04/08/2024    ECHOCARDIOGRAM REPORT   Patient Name:   Dennis Hill Date of Exam: 04/08/2024 Medical Rec #:  [id number]            Height:       77.0 in Accession #:    [id number]           Weight:       231.6 lb Date of Birth:  1946/04/29            BSA:          2.381  m Patient Age:    78 years             BP:           104/60 mmHg Patient Gender: M                    HR:           73 bpm. Exam Location:  [address] Procedure: 2D Echo, Cardiac Doppler and Color Doppler (Both Spectral and Color            Flow Doppler were utilized during procedure). Indications:    Atrial Fibrillation I48.92  History:        Patient has prior history of Echocardiogram examinations, most                 recent 07/05/2015. NICM, CAD; Risk Factors:Dyslipidemia.  Sonographer:    Augustin Seals RDCS Referring Phys: [id number] JOSHUA PARKER IMPRESSIONS  1. Left ventricular ejection fraction, by estimation, is 55 to 60%. The left ventricle has normal function. The left ventricle has no regional wall motion abnormalities. Left ventricular diastolic parameters were normal.  2. Right ventricular systolic function is normal. The right ventricular size is normal.  3. The mitral valve is normal in structure. No evidence of mitral valve regurgitation. No evidence of mitral stenosis.  4. The aortic valve is tricuspid. Aortic valve regurgitation is not visualized. Aortic valve sclerosis is present, with no evidence of aortic valve stenosis.  5. The inferior vena cava is normal in size with greater than 50% respiratory variability, suggesting right atrial pressure of 3 mmHg. Comparison(s): No prior Echocardiogram. FINDINGS  Left Ventricle: Left ventricular ejection fraction, by estimation, is 55 to 60%. The left ventricle has normal function. The left ventricle has no regional wall motion abnormalities. The left ventricular internal cavity size was normal in size. There is  no left ventricular hypertrophy. Left ventricular diastolic parameters were normal. Right Ventricle: The right ventricular size is normal. Right ventricular systolic function is normal. Left Atrium: Left atrial size was normal in size. Right Atrium: Right atrial size was normal in size. Pericardium: There is no evidence of pericardial  effusion. Mitral Valve: The mitral valve is normal in structure. Mild mitral annular calcification. No evidence of mitral  valve regurgitation. No evidence of mitral valve stenosis. Tricuspid Valve: The tricuspid valve is normal in structure. Tricuspid valve regurgitation is trivial. No evidence of tricuspid stenosis. Aortic Valve: The aortic valve is tricuspid. Aortic valve regurgitation is not visualized. Aortic valve sclerosis is present, with no evidence of aortic valve stenosis. Pulmonic Valve: The pulmonic valve was not well visualized. Pulmonic valve regurgitation is not visualized. No evidence of pulmonic stenosis. Aorta: The aortic root is normal in size and structure. Venous: The inferior vena cava is normal in size with greater than 50% respiratory variability, suggesting right atrial pressure of 3 mmHg. IAS/Shunts: The interatrial septum was not well visualized.  LEFT VENTRICLE PLAX 2D LVIDd:         4.70 cm   Diastology LVIDs:         3.20 cm   LV e' medial:  9.03 cm/s LV PW:         1.00 cm   LV e' lateral: 10.00 cm/s LV IVS:        1.00 cm LVOT diam:     2.20 cm LV SV:         46 LV SV Index:   19 LVOT Area:     3.80 cm  RIGHT VENTRICLE RV S prime:     11.00 cm/s TAPSE (M-mode): 2.4 cm LEFT ATRIUM             Index        RIGHT ATRIUM           Index LA diam:        3.40 cm 1.43 cm/m   RA Area:     17.00 cm LA Vol (A2C):   41.0 ml 17.22 ml/m  RA Volume:   41.40 ml  17.39 ml/m LA Vol (A4C):   62.0 ml 26.04 ml/m LA Biplane Vol: 54.1 ml 22.72 ml/m  AORTIC VALVE LVOT Vmax:   72.45 cm/s LVOT Vmean:  46.600 cm/s LVOT VTI:    0.122 m  AORTA Ao Root diam: 3.60 cm Ao Asc diam:  3.60 cm  SHUNTS Systemic VTI:  0.12 m Systemic Diam: 2.20 cm Redell Shallow MD Electronically signed by Redell Shallow MD Signature Date/Time: 04/08/2024/4:00:34 PM    Final     Assessment/Plan Orthostatic hypotension Since patient was started on midodrine, blood pressures have been stable and he has had no further episodes of  syncope  Chronic atrial fibrillation (HCC) He is anticoagulated but he takes no medicines for rate control and he is asymptomatic  Major neurocognitive disorder (HCC) Followed at as outpatient at [facility] care medications include Aricept  and Namenda     Family/ staff Communication:   Labs/tests ordered: per NP  Garnette HERO. Cleotilde, MD [facility] [address] [place], [place] [address] Office [phone]

## 2024-08-19 ENCOUNTER — Other Ambulatory Visit

## 2024-08-20 ENCOUNTER — Telehealth: Payer: Self-pay | Admitting: Family

## 2024-08-20 ENCOUNTER — Telehealth: Payer: Self-pay

## 2024-08-20 ENCOUNTER — Non-Acute Institutional Stay (SKILLED_NURSING_FACILITY): Payer: Self-pay | Admitting: Adult Health

## 2024-08-20 ENCOUNTER — Encounter: Payer: Self-pay | Admitting: Adult Health

## 2024-08-20 DIAGNOSIS — M109 Gout, unspecified: Secondary | ICD-10-CM

## 2024-08-20 DIAGNOSIS — F02C0 Dementia in other diseases classified elsewhere, severe, without behavioral disturbance, psychotic disturbance, mood disturbance, and anxiety: Secondary | ICD-10-CM | POA: Diagnosis not present

## 2024-08-20 DIAGNOSIS — M79672 Pain in left foot: Secondary | ICD-10-CM

## 2024-08-20 DIAGNOSIS — G301 Alzheimer's disease with late onset: Secondary | ICD-10-CM | POA: Diagnosis not present

## 2024-08-20 MED ORDER — COLCHICINE 0.6 MG PO TABS: 0.6000 mg | ORAL_TABLET | Freq: Every day | ORAL | 0 refills | Status: DC

## 2024-08-20 NOTE — Telephone Encounter (Signed)
 Patient daughter [name] came in and dropped off forms for [facility] to be completed by PCP. She want a call when done so she can pick forms up. Given to Chrae in [place].

## 2024-08-20 NOTE — Telephone Encounter (Signed)
 Lauraine Lanius saw Mr. Besecker 07/09/2024 with the below plan. He cancelled his Chest CT  3 times and completed a CT Chest Angio with [facility] 07/30/2024. Results also below. He was scheduled to see you 12/9//2025 to review CT results compared to previous CT's and decide if a PET was needed. That appointment has also been cancelled. How would you like to move forward with this patient?  Isaiah HARPER Plan as of 07/09/2024 Right upper lobe pulmonary nodule, increasing in size Nodule increased from 7x5 mm in 2017 to 10x7 mm.  Requires follow up due to smoking and family history of lung cancer. - Order dedicated CT of the chest within two weeks at [facility]. - Evaluate CT results and compare with previous scans from 2017 and May 2025. - Consider PET scan if significant growth is observed.    Recent CT Angio Chest Results-in Care Everywhere  IMPRESSION:  1. Mural filling defect in the segmental right apical pulmonary artery with distal contrast opacification of the subsegmental apical pulmonary arterial branches consistent with nonocclusive or chronic pulmonary embolus. No findings of acute pulmonary emboli.  2.  8mm right apical pulmonary nodule, recommendations as below.  3.  Left superior pole nonobstructive renal calculus measures 1.7 cm.   Nodule 6-7mm:  Low Risk- CT at 6-12 months; then consider CT at 18-24 months.  High Risk- CT at 6-12 months; then CT at 18-24 months.

## 2024-08-20 NOTE — Progress Notes (Signed)
 Location:  [facility] Room Number: 17 A Place of Service:    Provider:  Medina-Vargas, Wenzel Backlund, DNP, FNP-BC  Patient Care Team: [name], Roxan BROCKS, NP as PCP - General (Family Medicine) [name], MD as PCP - Electrophysiology (Cardiology)  Extended Emergency Contact Information Primary Emergency Contact: [name] Address: [address]          [place], [place] [address] [place] Mobile Phone: [phone] Relation: Daughter Secondary Emergency Contact: Arlyss Perkins Address: [address]          [place], [place] [address] [place] Phone: [phone] Relation: Daughter  Code Status:   Full Code  Goals of care: Advanced Directive information    08/12/2024    2:24 PM  Advanced Directives  Does Patient Have a Medical Advance Directive? Yes  Type of Estate Agent of [place];Living will;Out of facility DNR (pink MOST or yellow form)  Does patient want to make changes to medical advance directive? No - Patient declined  Copy of Healthcare Power of Attorney in Chart? Yes - validated most recent copy scanned in chart (See row information)  Pre-existing out of facility DNR order (yellow form or pink MOST form) Yellow form placed in chart (order not valid for inpatient use)     Chief Complaint  Patient presents with   Acute Visit    Left foot pain    HPI:  Pt is a 78 y.o. male seen today for an acute visit regarding left foot pain. He is a resident of [facility]. He has just came back for therapy and was reported to have severe pain on his left foot, 6-7/10. Noted to have uric acid 8.6, elevated on latest lab (08/19/24). He has  left foot covered with dressing and ace wrap with left toes noted to be slightly swollen. He is currently taking Augmentin for infected left foot with abscess. He had abscess on left foot and I/D by podiatry was done 11/23.  Latest BP  127/88, takes Midodrine and Fludrocortisone for hypotension.  He has dementia and takes Memantine . Latest BIMS score 7/15   Past Medical History:  Diagnosis Date   Anxiety    Arthritis    Atrial bigeminy 09/18/2006   Dr. 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   Fatty liver    GERD 03/15/2009   no medications now   Headache(784.0)    monthly (03/06/2013)   Hx of adenomatous colonic polyps 03/15/2009   Hx of cardiovascular stress test    ETT-Myoview (9/15):  Inferior infarct, no ischemia, EF 41% - Intermediate Risk   Hyperlipidemia 02/10/2011   NEPHROLITHIASIS, HX OF 01/28/2010   NICM (nonischemic cardiomyopathy) (HCC)    Echo (10/15):  Mild LVH, EF 35-40%, diff HK   Pneumonia ~ 1950   Sleep apnea    Past Surgical History:  Procedure Laterality Date   APPENDECTOMY     ATRIAL FLUTTER ABLATION  03/06/2013   ATRIAL FLUTTER ABLATION N/A 03/06/2013   Procedure: ATRIAL FLUTTER ABLATION;  Surgeon: Elspeth BROCKS Fernande, MD;  Location: [facility] CATH LAB;  Service: Cardiovascular;  Laterality:  N/A;   CARDIOVERSION N/A 02/06/2013   Procedure: CARDIOVERSION;  Surgeon: Danelle LELON Birmingham, MD;  Location: [facility] ENDOSCOPY;  Service: Cardiovascular;  Laterality: N/A;   CHOLECYSTECTOMY  09/18/2004   INGUINAL HERNIA REPAIR Right 09/18/1984   JOINT REPLACEMENT     LEFT HEART CATHETERIZATION WITH CORONARY ANGIOGRAM N/A 06/19/2014   Procedure: LEFT HEART CATHETERIZATION WITH CORONARY ANGIOGRAM;  Surgeon: Lonni JONETTA Cash, MD;  Location: [facility] CATH LAB;  Service: Cardiovascular;  Laterality: N/A;   TEE WITHOUT CARDIOVERSION N/A 03/05/2013   Procedure: TRANSESOPHAGEAL ECHOCARDIOGRAM (TEE);  Surgeon: Ezra GORMAN Shuck, MD;  Location: [facility] ENDOSCOPY;  Service: Cardiovascular;  Laterality: N/A;  pam rogue   TONSILLECTOMY AND ADENOIDECTOMY  ~ 1952   TOTAL KNEE ARTHROPLASTY Left 02/22/2016   Procedure: TOTAL KNEE ARTHROPLASTY;  Surgeon: Maude Herald, MD;  Location: [facility] OR;  Service: Orthopedics;  Laterality: Left;    Allergies  Allergen Reactions   Lisinopril  Swelling    Tongue swelling   Xarelto  [Rivaroxaban ] Swelling    Tongue swelling   Lipitor [Atorvastatin ] Other (See Comments)    Mental foggy    Outpatient Encounter Medications as of 08/20/2024  Medication Sig   acetaminophen  (TYLENOL ) 325 MG tablet Take 650 mg by mouth every 6 (six) hours as needed for moderate pain (pain score 4-6).   amoxicillin-clavulanate (AUGMENTIN) 875-125 MG tablet Take 1 tablet by mouth 2 (two) times daily.   b complex vitamins capsule Take 1 capsule by mouth daily.   cetirizine  (ZYRTEC ) 10 MG tablet Take 10 mg by mouth daily.   co-enzyme Q-10 30 MG capsule Take 50 mg by mouth daily.   diclofenac  sodium (VOLTAREN ) 1 % GEL Apply 4 g topically 4 (four) times daily as needed.   donepezil  (ARICEPT ) 5 MG tablet Take 5 mg by mouth daily in the afternoon.   ELIQUIS  5 MG TABS tablet TAKE 1 TABLET(5 MG) BY MOUTH TWICE DAILY   fludrocortisone (FLORINEF) 0.1 MG tablet Take 0.1 mg by mouth daily.   fluticasone  (FLONASE ) 50 MCG/ACT nasal spray Place 1 spray into both nostrils daily.   memantine  (NAMENDA ) 10 MG tablet Take 1 tablet (10 mg total) by mouth 2 (two) times daily. Take 1 tablet  twice a day   midodrine (PROAMATINE) 5 MG tablet Take 5 mg by mouth 3 (three) times daily.   No facility-administered encounter medications on file as of 08/20/2024.    Review of Systems  Constitutional:  Negative for activity change, appetite change and fever.  HENT:  Negative for sore throat.   Eyes: Negative.   Cardiovascular:  Negative for chest pain and leg swelling.  Gastrointestinal:  Negative for abdominal distention, diarrhea and  vomiting.  Genitourinary:  Negative for dysuria, frequency and urgency.  Musculoskeletal:        Left foot pain, 6-7/10  Skin:  Negative for color change.  Neurological:  Negative for dizziness and headaches.  Psychiatric/Behavioral:  Negative for behavioral problems and sleep disturbance. The patient is not nervous/anxious.      Immunization History  Administered Date(s) Administered   INFLUENZA, HIGH DOSE SEASONAL PF 07/27/2017   Moderna Sars-Covid-2 Vaccination 11/10/2019, 12/08/2019   PPD Test 02/24/2016   Pneumococcal Conjugate-13 01/09/2014   Pneumococcal Polysaccharide-23 03/15/2009   Td 03/15/2009   Tdap 05/01/2024   Pertinent  Health Maintenance Due  Topic Date Due   OPHTHALMOLOGY EXAM  04/16/2015   HEMOGLOBIN A1C  06/14/2024   Influenza Vaccine  12/16/2024 (Originally 04/18/2024)   FOOT EXAM  04/10/2025   Colonoscopy  Discontinued  02/05/2024    1:37 PM 05/01/2024   10:58 AM 07/08/2024    6:06 PM 07/10/2024    1:33 PM 07/11/2024    8:33 AM  Fall Risk  Falls in the past year? 0 0 1 1 1   Was there an injury with Fall? 0  0  1  1  0   Fall Risk Category Calculator 0 0 2  2 2   Patient at Risk for Falls Due to  No Fall Risks  No Fall Risks   Fall risk Follow up Falls evaluation completed Falls evaluation completed  Falls evaluation completed Falls evaluation completed     Patient-reported   Data saved with a previous flowsheet row definition     Vitals:   08/20/24 1246  BP: 127/88  Pulse: 70  Resp: 17  Temp: (!) 96.7 F (35.9 C)  SpO2: 96%  Weight: 220 lb (99.8 kg)  Height: 6' 5 (1.956 m)   Body mass index is 26.09 kg/m.  Physical Exam Constitutional:      Appearance: Normal appearance.  HENT:     Head: Normocephalic and atraumatic.     Mouth/Throat:     Mouth: Mucous membranes are moist.  Eyes:     Conjunctiva/sclera: Conjunctivae normal.  Cardiovascular:     Rate and Rhythm: Normal rate and regular rhythm.     Pulses: Normal pulses.      Heart sounds: Normal heart sounds.  Pulmonary:     Effort: Pulmonary effort is normal.     Breath sounds: Normal breath sounds.  Abdominal:     General: Bowel sounds are normal.     Palpations: Abdomen is soft.  Musculoskeletal:        General: No swelling. Normal range of motion.     Cervical back: Normal range of motion.  Skin:    General: Skin is warm and dry.     Comments: Left foot covered with dressing and ace wrap  Neurological:     Mental Status: He is alert.  Psychiatric:        Mood and Affect: Mood normal.        Behavior: Behavior normal.        Labs reviewed: Recent Labs    12/13/23 0824 02/06/24 1119  NA 144 141  K 5.7* 5.1  CL 108 106  CO2 30 30  GLUCOSE 116* 106  BUN 24 20  CREATININE 1.23 1.13  CALCIUM  10.2 9.9   Recent Labs    12/13/23 0824 02/06/24 1119  AST 14 19  ALT 14 26  BILITOT 0.5 0.6  PROT 6.5 6.4   Recent Labs    12/13/23 0824 02/06/24 1119  WBC 7.4 8.3  NEUTROABS 4,329 5,212  HGB 15.4 13.6  HCT 46.1 42.0  MCV 87.6 88.6  PLT 232 333   Lab Results  Component Value Date   TSH 2.46 12/13/2023   Lab Results  Component Value Date   HGBA1C 6.4 (H) 12/13/2023   Lab Results  Component Value Date   CHOL 142 12/13/2023   HDL 39 (L) 12/13/2023   LDLCALC 84 12/13/2023   LDLDIRECT 87.2 09/05/2013   TRIG 94 12/13/2023   CHOLHDL 3.6 12/13/2023    Significant Diagnostic Results in last 30 days:  No results found.  Assessment/Plan  1. Acute gout of right foot, unspecified cause (Primary) -  left foot pain 6-7/10 -  uric acid 8.6, elevated -  will start on Colchicine 0.6 mg PO daily X 10 days - colchicine  0.6 MG tablet; Take 1 tablet (0.6 mg total) by mouth daily for 10 days.  Dispense: 10 tablet; Refill: 0  2. Left foot pain -  S/P I/D on 11/23 by podiatry -  continue Augmentin -  continue wound treatment  3. Severe late onset Alzheimer's dementia without behavioral disturbance, psychotic disturbance, mood  disturbance, or anxiety (HCC) -  BIMS score 7/15, ranging as severe cognitive impairment -  continue Memantine  10 mg BID -  fall precautions -  continue supportive care    Family/ staff Communication: Discussed plan of care with resident and charge nurse.  Labs/tests ordered: None    Parvin Stetzer Medina-Vargas, DNP, MSN, FNP-BC [facility] [phone] (Monday-Friday 8:00 a.m. - 5:00 p.m.) [phone] (after hours)

## 2024-08-20 NOTE — Telephone Encounter (Signed)
 Form placed in [name], Dinah C, NP review and sign folder to advise.  1.) Patient is currently in a SNF ([facility])  2.) Would we fill out form based on last OV from 05/01/24?

## 2024-08-20 NOTE — Telephone Encounter (Signed)
 Lauraine has reviewed the notes and CT scans. Please call patient and advise a 6 month follow up CT is needed. He must complete a Chest CT in May 2026 and preferable at [facility] so that we can see images. Please have patient call for weight loss or coughing up blood. Place CT order. Results and plan to PCP.

## 2024-08-20 NOTE — Telephone Encounter (Signed)
 Patient recent seen by Mast NP at the facility will have forms completed By her in case recent notes required.

## 2024-08-21 ENCOUNTER — Telehealth: Payer: Self-pay | Admitting: Family

## 2024-08-21 NOTE — Telephone Encounter (Signed)
 Please advise  Message sent to Mast, Man X, NP

## 2024-08-21 NOTE — Telephone Encounter (Signed)
 [name] completed as requested.Placed on outgoing fax box.Fax as requested and POA may pickup original copy.

## 2024-08-21 NOTE — Telephone Encounter (Signed)
 Called patient and spoke with daughter Bruno on [place]. Reviewed results and recommendations. Results and plan to PCP.

## 2024-08-22 NOTE — Telephone Encounter (Signed)
 Spoke with the patient's daughter, Krista, and let her know the forms are ready for pickup.

## 2024-08-22 NOTE — Telephone Encounter (Signed)
 [place] AM   08/22/24  9:37 AM Note Spoke with the patient's daughter, Krista, and let her know the forms are ready for pickup.        08/22/24  9:36 AM McGill, Alondra contacted [name] (Emergency Contact)   08/21/24  1:52 PM [name], Roxan BROCKS, NP routed this conversation to McGill, [place], Dinah C, NP    08/21/24  1:52 PM Note [name] completed as requested.Placed on outgoing fax box.Fax as requested and POA may pickup original copy.

## 2024-08-25 ENCOUNTER — Encounter: Payer: Self-pay | Admitting: Nurse Practitioner

## 2024-08-25 ENCOUNTER — Non-Acute Institutional Stay (SKILLED_NURSING_FACILITY): Payer: Self-pay | Admitting: Nurse Practitioner

## 2024-08-25 DIAGNOSIS — I482 Chronic atrial fibrillation, unspecified: Secondary | ICD-10-CM

## 2024-08-25 DIAGNOSIS — I429 Cardiomyopathy, unspecified: Secondary | ICD-10-CM

## 2024-08-25 DIAGNOSIS — M109 Gout, unspecified: Secondary | ICD-10-CM

## 2024-08-25 DIAGNOSIS — I951 Orthostatic hypotension: Secondary | ICD-10-CM

## 2024-08-25 DIAGNOSIS — N400 Enlarged prostate without lower urinary tract symptoms: Secondary | ICD-10-CM

## 2024-08-25 DIAGNOSIS — I5022 Chronic systolic (congestive) heart failure: Secondary | ICD-10-CM

## 2024-08-25 DIAGNOSIS — F0390 Unspecified dementia without behavioral disturbance: Secondary | ICD-10-CM

## 2024-08-25 DIAGNOSIS — E785 Hyperlipidemia, unspecified: Secondary | ICD-10-CM

## 2024-08-25 NOTE — Assessment & Plan Note (Signed)
Not taking statin.  

## 2024-08-25 NOTE — Progress Notes (Unsigned)
 Location:   [facility] Room Number: 67 Place of Service:  SNF (31)  Provider: Larwance Hark NP  PCP: Leonarda Roxan BROCKS, NP Patient Care Team: [name], Roxan BROCKS, NP as PCP - General (Family Medicine) [name], MD as PCP - Electrophysiology (Cardiology)  Extended Emergency Contact Information Primary Emergency Contact: [name] Address: [address]          [place], [place] [address] [place] Mobile Phone: [phone] Relation: Daughter Secondary Emergency Contact: Arlyss Perkins Address: [address]          [place], [place] [address] [place] Phone: [phone] Relation: Daughter  Code Status: DNR Goals of care:  Advanced Directive information    08/12/2024    2:24 PM  Advanced Directives  Does Patient Have a Medical Advance Directive? Yes  Type of Estate Agent of [place];Living will;Out of facility DNR (pink MOST or yellow form)  Does patient want to make changes to medical advance directive? No - Patient declined  Copy of Healthcare Power of Attorney in Chart? Yes - validated most recent copy scanned in chart (See row information)  Pre-existing out of facility DNR order (yellow form or pink MOST form) Yellow form placed in chart (order not valid for inpatient use)     Allergies  Allergen Reactions   Lisinopril  Swelling    Tongue swelling   Xarelto  [Rivaroxaban ] Swelling    Tongue swelling   Lipitor [Atorvastatin ] Other (See Comments)    Mental foggy    No chief complaint on file.   HPI:  78 y.o. male with PMH significant for orthostatic hypotension, A-fib, CHF, gout, BPH, HLD, and dementia was admitted to [facility] for therapy.    Hospitalized 07/29/2024 to 08/11/2024 for symptomatic Orthostatic hypotension/syncope, AMS, UTI-completed 5 days of Rocephin, infected left foot with abscess-callus of the right big toe-status post I&D 08/10/2022, sutures intact, improved  erythema in area, completed 10 days course of Augmentin in SN[facility]  The patient has recovered from left foot I&D, regained physical strength, ADL function.  He is a stable to be discharged to assisted living at [facility] facility.              Orthostatic hypotension, on midodrine, Florinef, compression stockings, Bun/creat 18/0.99 08/11/24             A-fib, heart rate is being controlled, on Eliquis , no rate control medications             CHF, euvolemic.              Gout, left second toe, resolved acute flareup, treated with colchicine , uric acid 6.5 08/07/24             BPH, no urinary retention             CAD, stable.              HLD, not taking statin             Dementia, MRI brain amyloid angiopathy 08/07/2024, followed by [facility] neurologist, restarted Namenda  10 mg twice daily, decrease Aricept  to 5 mg p.o. twice daily.    Past Medical History:  Diagnosis Date   Anxiety    Arthritis    Atrial bigeminy 09/18/2006   Dr. 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   Fatty liver    GERD 03/15/2009   no medications now   Headache(784.0)    monthly (03/06/2013)   Hx of adenomatous colonic polyps 03/15/2009   Hx of cardiovascular stress test    ETT-Myoview (9/15):  Inferior infarct, no ischemia, EF 41% - Intermediate Risk   Hyperlipidemia 02/10/2011   NEPHROLITHIASIS, HX OF 01/28/2010   NICM (nonischemic cardiomyopathy) (HCC)    Echo (10/15):  Mild LVH, EF 35-40%, diff HK   Pneumonia ~ 1950   Sleep apnea     Past Surgical History:  Procedure Laterality Date   APPENDECTOMY     ATRIAL FLUTTER ABLATION  03/06/2013   ATRIAL FLUTTER ABLATION N/A 03/06/2013   Procedure:  ATRIAL FLUTTER ABLATION;  Surgeon: Elspeth JAYSON Fernande, MD;  Location: [facility] CATH LAB;  Service: Cardiovascular;  Laterality: N/A;   CARDIOVERSION N/A 02/06/2013   Procedure: CARDIOVERSION;  Surgeon: Danelle LELON Birmingham, MD;  Location: [facility] ENDOSCOPY;  Service: Cardiovascular;  Laterality: N/A;   CHOLECYSTECTOMY  09/18/2004   INGUINAL HERNIA REPAIR Right 09/18/1984   JOINT REPLACEMENT     LEFT HEART CATHETERIZATION WITH CORONARY ANGIOGRAM N/A 06/19/2014   Procedure: LEFT HEART CATHETERIZATION WITH CORONARY ANGIOGRAM;  Surgeon: Lonni JONETTA Cash, MD;  Location: [facility] CATH LAB;  Service: Cardiovascular;  Laterality: N/A;   TEE WITHOUT CARDIOVERSION N/A 03/05/2013   Procedure: TRANSESOPHAGEAL ECHOCARDIOGRAM (TEE);  Surgeon: Ezra GORMAN Shuck, MD;  Location: [facility] ENDOSCOPY;  Service: Cardiovascular;  Laterality: N/A;  pam rogue   TONSILLECTOMY AND ADENOIDECTOMY  ~ 1952   TOTAL KNEE ARTHROPLASTY Left 02/22/2016   Procedure: TOTAL KNEE ARTHROPLASTY;  Surgeon: Maude Herald, MD;  Location: [facility] OR;  Service: Orthopedics;  Laterality: Left;      reports that he quit smoking about 44 years ago. His smoking use included cigarettes. He started smoking about 57 years ago. He has a 13 pack-year smoking history. He has never used smokeless tobacco. He reports that he does not currently use alcohol after a past usage of about 1.0 standard drink of alcohol per week. He reports that he does not use drugs. Social History   Socioeconomic History   Marital status: Married    Spouse name: Not on file   Number of children: 2   Years of education: 16   Highest education level: Bachelor's degree (e.g., BA, AB, BS)  Occupational History   Occupation: Manufacturers Rep for [employer]  Tobacco Use   Smoking status: Former    Current packs/day: 0.00    Average packs/day: 1 pack/day for 13.0 years (13.0 ttl pk-yrs)    Types: Cigarettes    Start date: 12/25/1966    Quit date: 12/25/1979    Years since quitting: 44.7   Smokeless  tobacco: Never  Vaping Use   Vaping status: Never Used  Substance and Sexual Activity   Alcohol use: Not Currently    Alcohol/week: 1.0 standard drink of alcohol    Types: 1 Cans of beer per week    Comment: 03/06/2013 glass of wine maybe once a month; glass of beer/wk   Drug use: No   Sexual activity: Not Currently  Other Topics Concern   Not on file  Social History Narrative   Step daughter and 2 40 yo twins live with    Right  handed   Drink caffeine   [place] home   Social Drivers of Health   Financial Resource Strain: Low Risk  (07/08/2024)   Overall Financial Resource Strain (CARDIA)    Difficulty of Paying Living Expenses: Not hard at all  Food Insecurity: No Food Insecurity (07/30/2024)   Received from [facility]   Hunger Vital Sign    Within the past 12 months, you worried that your food would run out before you got the money to buy more.: Never true    Within the past 12 months, the food you bought just didn't last and you didn't have money to get more.: Never true  Recent Concern: Food Insecurity - Food Insecurity Present (05/01/2024)   Hunger Vital Sign    Worried About Running Out of Food in the Last Year: Never true    Ran Out of Food in the Last Year: Sometimes true  Transportation Needs: No Transportation Needs (08/01/2024)   Received from [facility] - Transportation    In the past 12 months, has lack of transportation kept you from medical appointments or from getting medications?: No    In the past 12 months, has lack of transportation kept you from meetings, work, or from getting things needed for daily living?: No  Physical Activity: Inactive (07/08/2024)   Exercise Vital Sign    Days of Exercise per Week: 0 days    Minutes of Exercise per Session: Not on file  Stress: No Stress Concern Present (07/30/2024)   Received from [facility] - Occupational Stress Questionnaire    Do you feel  stress - tense, restless, nervous, or anxious, or unable to sleep at night because your mind is troubled all the time - these days?: Not at all  Social Connections: Moderately Isolated (07/08/2024)   Social Connection and Isolation Panel    Frequency of Communication with Friends and Family: More than three times a week    Frequency of Social Gatherings with Friends and Family: Once a week    Attends Religious Services: Never    Database Administrator or Organizations: No    Attends Engineer, Structural: Not on file    Marital Status: Married  Catering Manager Violence: Not At Risk (07/29/2024)   Received from [facility]   HITS    Over the last 12 months how often did your partner physically hurt you?: Never    Over the last 12 months how often did your partner insult you or talk down to you?: Never    Over the last 12 months how often did your partner threaten you with physical harm?: Never    Over the last 12 months how often did your partner scream or curse at you?: Never   Functional Status Survey:    Allergies  Allergen Reactions   Lisinopril  Swelling    Tongue swelling   Xarelto  Furkan.galla ] Swelling    Tongue swelling   Lipitor [Atorvastatin ] Other (See Comments)    Mental foggy    Pertinent  Health Maintenance Due  Topic Date Due   OPHTHALMOLOGY EXAM  04/16/2015   HEMOGLOBIN A1C  06/14/2024   Influenza Vaccine  12/16/2024 (Originally 04/18/2024)   FOOT EXAM  04/10/2025   Colonoscopy  Discontinued    Medications: Allergies as of 08/25/2024       Reactions   Lisinopril  Swelling   Tongue swelling   Xarelto  [rivaroxaban ] Swelling   Tongue swelling  Lipitor [atorvastatin ] Other (See Comments)   Mental foggy        Medication List        Accurate as of August 25, 2024 11:59 PM. If you have any questions, ask your nurse or doctor.          acetaminophen  325 MG tablet Commonly known as: TYLENOL  Take 650 mg by mouth every 6 (six) hours as  needed for moderate pain (pain score 4-6).   b complex vitamins capsule Take 1 capsule by mouth daily.   cetirizine  10 MG tablet Commonly known as: ZYRTEC  Take 10 mg by mouth daily.   co-enzyme Q-10 30 MG capsule Take 50 mg by mouth daily.   colchicine  0.6 MG tablet Take 1 tablet (0.6 mg total) by mouth daily for 10 days.   diclofenac  sodium 1 % Gel Commonly known as: Voltaren  Apply 4 g topically 4 (four) times daily as needed.   donepezil  5 MG tablet Commonly known as: ARICEPT  Take 5 mg by mouth daily in the afternoon.   Eliquis  5 MG Tabs tablet Generic drug: apixaban  TAKE 1 TABLET(5 MG) BY MOUTH TWICE DAILY   fludrocortisone 0.1 MG tablet Commonly known as: FLORINEF Take 0.1 mg by mouth daily.   fluticasone  50 MCG/ACT nasal spray Commonly known as: FLONASE  Place 1 spray into both nostrils daily.   memantine  10 MG tablet Commonly known as: NAMENDA  Take 1 tablet (10 mg total) by mouth 2 (two) times daily. Take 1 tablet  twice a day   midodrine 5 MG tablet Commonly known as: PROAMATINE Take 5 mg by mouth 3 (three) times daily.        Review of Systems  Constitutional:  Negative for appetite change, fatigue and fever.  HENT:  Negative for congestion and trouble swallowing.   Eyes:  Negative for visual disturbance.  Respiratory:  Negative for cough, shortness of breath and wheezing.   Cardiovascular:  Negative for chest pain, palpitations and leg swelling.  Gastrointestinal:  Negative for abdominal pain and constipation.  Genitourinary:  Negative for difficulty urinating, dysuria and urgency.       Incontinent of urine  Musculoskeletal:  Positive for arthralgias, gait problem and joint swelling.  Skin:  Positive for wound.  Neurological:  Negative for syncope, weakness and headaches.       Dementia  Psychiatric/Behavioral:  Negative for behavioral problems and sleep disturbance. The patient is not nervous/anxious.     Vitals:   08/25/24 1234  BP: 135/65   Pulse: 80  Resp: 17  Temp: (!) 96.7 F (35.9 C)  SpO2: 97%  Weight: 220 lb (99.8 kg)   Body mass index is 26.09 kg/m. Physical Exam Vitals and nursing note reviewed.  Constitutional:      Appearance: Normal appearance.  HENT:     Head: Normocephalic and atraumatic.     Nose: Nose normal.     Mouth/Throat:     Mouth: Mucous membranes are moist.  Eyes:     Extraocular Movements: Extraocular movements intact.     Conjunctiva/sclera: Conjunctivae normal.     Pupils: Pupils are equal, round, and reactive to light.  Cardiovascular:     Rate and Rhythm: Normal rate and regular rhythm.     Heart sounds: No murmur heard. Pulmonary:     Effort: Pulmonary effort is normal.     Breath sounds: No wheezing, rhonchi or rales.  Abdominal:     General: Bowel sounds are normal.     Palpations: Abdomen is soft.  Tenderness: There is no abdominal tenderness.  Musculoskeletal:        General: Tenderness present. Normal range of motion.     Cervical back: Normal range of motion and neck supple.     Right lower leg: No edema.     Left lower leg: No edema.     Comments: Improved the left  great toe and MTP pain  Skin:    General: Skin is warm and dry.     Findings: Erythema present.     Comments: Medial left big toe to 1st MTP, sutures intact,  with the area erythema and mild swelling  Neurological:     General: No focal deficit present.     Mental Status: He is alert and oriented to person, place, and time. Mental status is at baseline.     Motor: No weakness.     Coordination: Coordination normal.     Gait: Gait abnormal.  Psychiatric:        Mood and Affect: Mood normal.        Behavior: Behavior normal.        Thought Content: Thought content normal.        Judgment: Judgment normal.     Labs reviewed: Basic Metabolic Panel: Recent Labs    12/13/23 0824 02/06/24 1119  NA 144 141  K 5.7* 5.1  CL 108 106  CO2 30 30  GLUCOSE 116* 106  BUN 24 20  CREATININE 1.23 1.13   CALCIUM  10.2 9.9   Liver Function Tests: Recent Labs    12/13/23 0824 02/06/24 1119  AST 14 19  ALT 14 26  BILITOT 0.5 0.6  PROT 6.5 6.4   No results for input(s): LIPASE, AMYLASE in the last 8760 hours. No results for input(s): AMMONIA in the last 8760 hours. CBC: Recent Labs    12/13/23 0824 02/06/24 1119  WBC 7.4 8.3  NEUTROABS 4,329 5,212  HGB 15.4 13.6  HCT 46.1 42.0  MCV 87.6 88.6  PLT 232 333   Cardiac Enzymes: No results for input(s): CKTOTAL, CKMB, CKMBINDEX, TROPONINI in the last 8760 hours. BNP: Invalid input(s): POCBNP CBG: No results for input(s): GLUCAP in the last 8760 hours.  Procedures and Imaging Studies During Stay: No results found.  Assessment/Plan:   Chronic atrial fibrillation (HCC) heart rate is being controlled, on Eliquis , no rate control medications  Chronic systolic CHF (congestive heart failure) (HCC) euvolemic.   Gout left second toe, resolved acute flareup, treated with colchicine , uric acid 6.5 08/07/24  Benign prostatic hyperplasia no urinary retention  Cardiomyopathy (HCC) stable  Hyperlipidemia Not taking statin.   Major neurocognitive disorder (HCC) No behavioral issues MRI brain amyloid angiopathy 08/07/2024, followed by [facility] neurologist, restarted Namenda  10 mg twice daily, decrease Aricept  to 5 mg p.o. twice daily.  Orthostatic hypotension Blood pressures to be maintained  Orthostatic hypotension, on midodrine, Florinef, compression stockings, Bun/creat 18/0.99 08/11/24   Patient is being discharged with the following home health services:    Patient is being discharged with the following durable medical equipment:    Patient has been advised to f/u with their PCP in 1-2 weeks to bring them up to date on their rehab stay.  Social services at facility was responsible for arranging this appointment.  Pt was provided with a 30 day supply of prescriptions for medications and refills must be  obtained from their PCP.  For controlled substances, a more limited supply may be provided adequate until PCP appointment only.  Future labs/tests needed:  None

## 2024-08-25 NOTE — Assessment & Plan Note (Signed)
 stable

## 2024-08-25 NOTE — Assessment & Plan Note (Signed)
 left second toe, resolved acute flareup, treated with colchicine, uric acid 6.5 08/07/24

## 2024-08-25 NOTE — Assessment & Plan Note (Signed)
 Blood pressures to be maintained  Orthostatic hypotension, on midodrine, Florinef, compression stockings, Bun/creat 18/0.99 08/11/24

## 2024-08-25 NOTE — Assessment & Plan Note (Signed)
 No behavioral issues MRI brain amyloid angiopathy 08/07/2024, followed by [facility] neurologist, restarted Namenda  10 mg twice daily, decrease Aricept  to 5 mg p.o. twice daily.

## 2024-08-25 NOTE — Assessment & Plan Note (Signed)
 no urinary retention

## 2024-08-25 NOTE — Assessment & Plan Note (Signed)
euvolemic 

## 2024-08-25 NOTE — Assessment & Plan Note (Signed)
 heart rate is being controlled, on Eliquis , no rate control medications

## 2024-08-26 ENCOUNTER — Ambulatory Visit: Admitting: Acute Care

## 2024-09-03 ENCOUNTER — Telehealth: Payer: Self-pay

## 2024-09-03 NOTE — Telephone Encounter (Signed)
 Call returned to [facility] Agency and verbal orders authorized per [employer] standing order

## 2024-09-03 NOTE — Telephone Encounter (Signed)
 Copied from CRM #[id number]. Topic: Clinical - Home Health Verbal Orders >> Sep 03, 2024 12:55 PM Debby BROCKS wrote: Caller/Agency: Chiquita from [facility] Callback Number: [phone] Service Requested: Speech Therapy Frequency: 1 week 5 Any new concerns about the patient? No

## 2024-09-17 DIAGNOSIS — E785 Hyperlipidemia, unspecified: Secondary | ICD-10-CM | POA: Diagnosis not present

## 2024-09-17 DIAGNOSIS — I480 Paroxysmal atrial fibrillation: Secondary | ICD-10-CM | POA: Diagnosis not present

## 2024-09-17 DIAGNOSIS — K5730 Diverticulosis of large intestine without perforation or abscess without bleeding: Secondary | ICD-10-CM | POA: Diagnosis not present

## 2024-09-17 DIAGNOSIS — I429 Cardiomyopathy, unspecified: Secondary | ICD-10-CM | POA: Diagnosis not present

## 2024-09-17 DIAGNOSIS — I951 Orthostatic hypotension: Secondary | ICD-10-CM | POA: Diagnosis not present

## 2024-09-17 DIAGNOSIS — N402 Nodular prostate without lower urinary tract symptoms: Secondary | ICD-10-CM | POA: Diagnosis not present

## 2024-09-17 DIAGNOSIS — M109 Gout, unspecified: Secondary | ICD-10-CM | POA: Diagnosis not present

## 2024-09-17 DIAGNOSIS — I5022 Chronic systolic (congestive) heart failure: Secondary | ICD-10-CM | POA: Diagnosis not present

## 2024-09-17 DIAGNOSIS — I2510 Atherosclerotic heart disease of native coronary artery without angina pectoris: Secondary | ICD-10-CM | POA: Diagnosis not present

## 2024-09-17 DIAGNOSIS — Z7901 Long term (current) use of anticoagulants: Secondary | ICD-10-CM | POA: Diagnosis not present

## 2024-09-17 DIAGNOSIS — F02B4 Dementia in other diseases classified elsewhere, moderate, with anxiety: Secondary | ICD-10-CM | POA: Diagnosis not present

## 2024-09-17 DIAGNOSIS — K760 Fatty (change of) liver, not elsewhere classified: Secondary | ICD-10-CM | POA: Diagnosis not present

## 2024-09-25 ENCOUNTER — Telehealth: Payer: Self-pay

## 2024-09-25 NOTE — Telephone Encounter (Signed)
 Verbal order ok left detailed message on secure line.

## 2024-09-25 NOTE — Telephone Encounter (Signed)
 Copied from CRM [id number]. Topic: Clinical - Home Health Verbal Orders >> Sep 25, 2024  2:55 PM Mercer PEDLAR wrote: Caller/Agency: Chiquita GLENWOOD Rue Therapist [facility] Callback Number: [phone] (secure line) Service Requested: Speech Therapy Frequency: 1 week 5 Any new concerns about the patient? No

## 2024-10-03 ENCOUNTER — Ambulatory Visit (HOSPITAL_BASED_OUTPATIENT_CLINIC_OR_DEPARTMENT_OTHER)

## 2024-10-06 ENCOUNTER — Ambulatory Visit: Admitting: Podiatry

## 2024-10-13 ENCOUNTER — Ambulatory Visit (HOSPITAL_COMMUNITY)

## 2024-10-13 ENCOUNTER — Ambulatory Visit: Admitting: Physician Assistant

## 2024-10-14 ENCOUNTER — Ambulatory Visit: Payer: Self-pay

## 2024-10-14 NOTE — Telephone Encounter (Signed)
 Called pts daughter Krista to schedule appt for pt. Unable to reach, LVM to call office back. Routing to callbacks for additional attempt.

## 2024-10-14 NOTE — Telephone Encounter (Signed)
 Nurse Triage completed. Pt needs to be seen by PCP within 24 hours. LVM for daughter of pt (Christa) to schedule an appointment, as she is the patient's transportation. Please await call back and schedule appointment.  Christa daughter of pt - [phone]   Reason for Triage: swelling legs reported by [facility]     Reason for Disposition  [1] Weight GAIN > 5 lbs (2 kg) in one week OR [2] 5 lbs (2 kg) over target weight.  Answer Assessment - Initial Assessment Questions 1. MAIN CONCERN OR SYMPTOM: What is your main concern right now? What's the main symptom you're worried about? (e.g., breathing difficulty, ankle swelling, weight gain).     Bilat leg swelling, denies SOB, denies redness, pt is still ambulatory per Ellie PT   2. ONSET: When did the  swelling  start?     A couple of weeks per PT  3. BREATHING DIFFICULTY: Are you having any difficulty breathing? If Yes, ask: How bad is it?  (e.g., none, mild, moderate, severe)  Is this worse than usual for you?     Denies  4. EDEMA - FOOT-LEG SWELLING: Do you have swelling of your ankles, feet or legs? If Yes, ask: How bad is the swelling? (e.g., localized; mild, moderate, severe)     1 - 2+ pitting edema per Ellie the PT  5. WEIGHT - CURRENT: What is your weight today?     235.2 lb  7. WEIGHT - CHANGE: Have you gained (or lost) weight in the past 24 hours? Past week (7 days)? If Yes, ask:  How much weight?     Was 230 last week   8. OTHER SYMPTOMS: Do you have any other symptoms? (e.g., depression, weakness or fatigue, abdomen bloating, hacky cough)     Denies  9. DIURETICS: Are you currently taking water pills? (e.g., furosemide [Lasix], hydrochlorothiazide [HCTZ], bumetanide [Bumex], metolazone [Zaroxolyn]) If Yes, ask: What medicine are you taking, and how often?  Any recent change in dose?      PT denies  12. FLUID and SODIUM RESTRICTIONS: Have you been instructed to restrict your daily  fluid intake or sodium (salt) intake? If Yes, ask: What are your daily limits?       Pt has been educated on lowering sodium intake but his dementia makes compliance difficult per [facility] PT  Answer Assessment - Initial Assessment Questions 1. ONSET: When did the swelling start? (e.g., minutes, hours, days)     Couple weeks   2. LOCATION: What part of the leg is swollen?  Are both legs swollen or just one leg?     Bilat legs  3. SEVERITY: How bad is the swelling? (e.g., localized; mild, moderate, severe)     1+ - 2+ per PT Ellie  4. REDNESS: Is there redness or signs of infection?     *No Answer*  5. PAIN: Is the swelling painful to touch? If Yes, ask: How painful is it?   (Scale 1-10; mild, moderate or severe)     *No Answer*  6. FEVER: Do you have a fever? If Yes, ask: What is it, how was it measured, and when did it start?      *No Answer*  7. CAUSE: Wh at do you think is causing the leg swelling?     *No Answer* 8. MEDICAL HISTORY: Do you have a history of blood clots (e.g., DVT), cancer, heart failure, kidney disease, or liver failure?     *No Answer*  9. RECURRENT SYMPTOM: Have you had leg swelling before? If Yes, ask: When was the last time? What happened that time?     *No Answer*  10. OTHER SYMPTOMS: Do you have any other symptoms? (e.g., chest pain, difficulty breathing)       *No Answer* 11. PREGNANCY: Is there any chance you are pregnant? When was your last menstrual period?       *No Answer*  Protocols used: Leg Swelling and Edema-A-AH, Heart Failure on Treatment Follow-up Call-A-AH

## 2024-10-14 NOTE — Telephone Encounter (Signed)
 Pt scheduled on 10/15/24. Advised ED if he experiences SOB above baseline, daughter verbalized understanding.

## 2024-10-14 NOTE — Telephone Encounter (Deleted)
 SABRA

## 2024-10-15 ENCOUNTER — Encounter: Payer: Self-pay | Admitting: Family

## 2024-10-15 ENCOUNTER — Ambulatory Visit: Admitting: Family

## 2024-10-15 VITALS — BP 122/62 | HR 48 | Temp 97.1°F | Ht 77.0 in | Wt 236.8 lb

## 2024-10-15 DIAGNOSIS — R600 Localized edema: Secondary | ICD-10-CM

## 2024-10-15 DIAGNOSIS — I5022 Chronic systolic (congestive) heart failure: Secondary | ICD-10-CM | POA: Diagnosis not present

## 2024-10-16 MED ORDER — MIDODRINE HCL 5 MG PO TABS: 5.0000 mg | ORAL_TABLET | Freq: Two times a day (BID) | ORAL | 1 refills | Status: AC

## 2024-10-16 NOTE — Telephone Encounter (Signed)
 Please advise if we will be refilling this medication

## 2024-11-04 ENCOUNTER — Ambulatory Visit: Admitting: Acute Care

## 2024-11-05 ENCOUNTER — Ambulatory Visit: Payer: Self-pay | Admitting: Family

## 2024-11-05 ENCOUNTER — Ambulatory Visit (HOSPITAL_COMMUNITY)

## 2024-11-11 ENCOUNTER — Ambulatory Visit: Admitting: Acute Care

## 2025-01-12 ENCOUNTER — Ambulatory Visit: Admitting: Dermatology

## 2025-01-12 ENCOUNTER — Ambulatory Visit: Admitting: Physician Assistant

## 2025-07-17 ENCOUNTER — Ambulatory Visit: Payer: Self-pay | Admitting: Family
